# Patient Record
Sex: Female | Born: 1965 | Race: White | Hispanic: No | Marital: Married | Smoking: Never smoker
Health system: Southern US, Community
[De-identification: ages and names within clinical notes are randomized; demographics above are authoritative.]

## PROBLEM LIST (undated history)

## (undated) DIAGNOSIS — T148XXA Other injury of unspecified body region, initial encounter: Secondary | ICD-10-CM

## (undated) DIAGNOSIS — K648 Other hemorrhoids: Secondary | ICD-10-CM

## (undated) DIAGNOSIS — G43829 Menstrual migraine, not intractable, without status migrainosus: Secondary | ICD-10-CM

## (undated) DIAGNOSIS — K602 Anal fissure, unspecified: Secondary | ICD-10-CM

## (undated) DIAGNOSIS — IMO0001 Reserved for inherently not codable concepts without codable children: Secondary | ICD-10-CM

## (undated) DIAGNOSIS — K210 Gastro-esophageal reflux disease with esophagitis: Secondary | ICD-10-CM

## (undated) DIAGNOSIS — B356 Tinea cruris: Secondary | ICD-10-CM

## (undated) DIAGNOSIS — N189 Chronic kidney disease, unspecified: Secondary | ICD-10-CM

## (undated) DIAGNOSIS — J309 Allergic rhinitis, unspecified: Secondary | ICD-10-CM

## (undated) DIAGNOSIS — G40909 Epilepsy, unspecified, not intractable, without status epilepticus: Secondary | ICD-10-CM

## (undated) DIAGNOSIS — G4730 Sleep apnea, unspecified: Secondary | ICD-10-CM

## (undated) DIAGNOSIS — M1990 Unspecified osteoarthritis, unspecified site: Secondary | ICD-10-CM

## (undated) DIAGNOSIS — K449 Diaphragmatic hernia without obstruction or gangrene: Secondary | ICD-10-CM

## (undated) DIAGNOSIS — K219 Gastro-esophageal reflux disease without esophagitis: Secondary | ICD-10-CM

## (undated) DIAGNOSIS — R569 Unspecified convulsions: Secondary | ICD-10-CM

## (undated) DIAGNOSIS — R7301 Impaired fasting glucose: Secondary | ICD-10-CM

## (undated) DIAGNOSIS — E669 Obesity, unspecified: Secondary | ICD-10-CM

## (undated) HISTORY — DX: Unspecified convulsions: R56.9

## (undated) HISTORY — DX: Other injury of unspecified body region, initial encounter: T14.8XXA

## (undated) HISTORY — DX: Diaphragmatic hernia without obstruction or gangrene: K44.9

## (undated) HISTORY — DX: Impaired fasting glucose: R73.01

## (undated) HISTORY — DX: Obesity, unspecified: E66.9

## (undated) HISTORY — DX: Unspecified osteoarthritis, unspecified site: M19.90

## (undated) HISTORY — DX: Gastro-esophageal reflux disease without esophagitis: K21.9

## (undated) HISTORY — DX: Tinea cruris: B35.6

## (undated) HISTORY — DX: Anal fissure, unspecified: K60.2

## (undated) HISTORY — DX: Menstrual migraine, not intractable, without status migrainosus: G43.829

## (undated) HISTORY — DX: Chronic kidney disease, unspecified: N18.9

## (undated) HISTORY — DX: Reserved for inherently not codable concepts without codable children: IMO0001

## (undated) HISTORY — DX: Epilepsy, unspecified, not intractable, without status epilepticus: G40.909

## (undated) HISTORY — DX: Sleep apnea, unspecified: G47.30

## (undated) HISTORY — DX: Other hemorrhoids: K64.8

## (undated) HISTORY — DX: Gastro-esophageal reflux disease with esophagitis: K21.0

## (undated) HISTORY — DX: Allergic rhinitis, unspecified: J30.9

## (undated) HISTORY — PX: OTHER SURGICAL HISTORY: SHX169

---

## 1997-05-30 ENCOUNTER — Inpatient Hospital Stay (HOSPITAL_COMMUNITY): Admission: AD | Admit: 1997-05-30 | Discharge: 1997-06-01 | Payer: Self-pay | Admitting: *Deleted

## 1998-08-01 ENCOUNTER — Observation Stay (HOSPITAL_COMMUNITY): Admission: AD | Admit: 1998-08-01 | Discharge: 1998-08-01 | Payer: Self-pay | Admitting: Obstetrics and Gynecology

## 1998-08-24 ENCOUNTER — Inpatient Hospital Stay (HOSPITAL_COMMUNITY): Admission: AD | Admit: 1998-08-24 | Discharge: 1998-08-24 | Payer: Self-pay | Admitting: *Deleted

## 1998-10-06 ENCOUNTER — Inpatient Hospital Stay (HOSPITAL_COMMUNITY): Admission: AD | Admit: 1998-10-06 | Discharge: 1998-10-06 | Payer: Self-pay | Admitting: Obstetrics and Gynecology

## 1999-01-03 ENCOUNTER — Inpatient Hospital Stay (HOSPITAL_COMMUNITY): Admission: AD | Admit: 1999-01-03 | Discharge: 1999-01-06 | Payer: Self-pay | Admitting: *Deleted

## 1999-01-04 ENCOUNTER — Encounter: Payer: Self-pay | Admitting: Obstetrics and Gynecology

## 1999-01-08 ENCOUNTER — Ambulatory Visit (HOSPITAL_COMMUNITY): Admission: RE | Admit: 1999-01-08 | Discharge: 1999-01-08 | Payer: Self-pay | Admitting: Psychiatry

## 1999-08-05 ENCOUNTER — Emergency Department (HOSPITAL_COMMUNITY): Admission: EM | Admit: 1999-08-05 | Discharge: 1999-08-05 | Payer: Self-pay | Admitting: *Deleted

## 2003-03-17 HISTORY — PX: CHOLECYSTECTOMY: SHX55

## 2006-10-01 ENCOUNTER — Ambulatory Visit (HOSPITAL_COMMUNITY): Admission: RE | Admit: 2006-10-01 | Discharge: 2006-10-03 | Payer: Self-pay | Admitting: *Deleted

## 2007-09-14 DIAGNOSIS — B356 Tinea cruris: Secondary | ICD-10-CM

## 2007-09-14 HISTORY — DX: Tinea cruris: B35.6

## 2007-12-06 ENCOUNTER — Encounter: Admission: RE | Admit: 2007-12-06 | Discharge: 2008-01-13 | Payer: Self-pay | Admitting: Neurology

## 2008-06-06 ENCOUNTER — Ambulatory Visit (HOSPITAL_COMMUNITY): Admission: RE | Admit: 2008-06-06 | Discharge: 2008-06-06 | Payer: Self-pay | Admitting: Rheumatology

## 2009-06-07 DIAGNOSIS — G4730 Sleep apnea, unspecified: Secondary | ICD-10-CM

## 2009-06-07 HISTORY — DX: Sleep apnea, unspecified: G47.30

## 2010-07-29 NOTE — Op Note (Signed)
NAMEMAXCINE, STRONG                ACCOUNT NO.:  [id number]   MEDICAL RECORD NO.:  [id number]          PATIENT TYPE:  INP   LOCATION:  1532                         FACILITY:  [facility]   PHYSICIAN:  Alfonse Ras, MD   DATE OF BIRTH:  [birth year]   DATE OF PROCEDURE:  10/01/2006  DATE OF DISCHARGE:  10/03/2006                               OPERATIVE REPORT   PREOPERATIVE DIAGNOSIS:  Morbid obesity and ventral hernia.   POSTOPERATIVE DIAGNOSIS:  Morbid obesity and ventral hernia.   PROCEDURE:  Laparoscopic ventral hernia repair with mesh, Parietex dual  sided.   SURGEON:  Dr. Baruch Merl.   ASSISTANT:  Dr. Luretha Murphy.   ANESTHESIA:  General.   DESCRIPTION:  The patient was taken to the operating room, placed in  supine position.  After adequate general anesthesia was induced using  endotracheal tube, the abdomen was prepped and draped in normal sterile  fashion.  Foley catheter was placed.  Using an 11 mm OptiVu trocar in  the left upper quadrant, peritoneal access was obtained under direct  vision.  Pneumoperitoneum was obtained.  Additional 11 mm trocar was  placed in left lower quadrant and the left mid abdomen.  Additional 5 mm  trocar was placed in the right midabdomen.  Omental contents of the  hernia were removed from the hernia sac.  The hernia defect itself was  not very large probably only about 4 to 5 cm.  Dual sided Parietex mesh  had four Novofil sutures placed in it and it was placed in the abdominal  cavity.  It was brought up to cover the hernia defect and sutures were  brought out using the Storz suture passer.  The sutures were tied down.  The periphery of the mesh was tacked with tacker in all directions.  This appeared to cover the hernia defect quite well.  Adequate  hemostasis was assured.  Trocars were removed.  Skin was closed with  interrupted 3-0 Monocryl.  Steri-Strips and dressings were applied.  The  patient tolerated the procedure well and went  to PACU in good condition.      Alfonse Ras, MD  Electronically Signed     KRE/MEDQ  D:  10/04/2006  T:  10/04/2006  Job:  [id number]

## 2010-08-01 NOTE — Discharge Summary (Signed)
NAMEANIJA, BRICKNER                ACCOUNT NO.:  [id number]   MEDICAL RECORD NO.:  [id number]          PATIENT TYPE:  INP   LOCATION:  1532                         FACILITY:  [facility]   PHYSICIAN:  Alfonse Ras, MD   DATE OF BIRTH:  1965-10-05   DATE OF ADMISSION:  10/01/2006  DATE OF DISCHARGE:  10/03/2006                               DISCHARGE SUMMARY   ADMISSION DIAGNOSIS:  Incarcerated ventral hernia.   DISCHARGE DIAGNOSIS:  Incarcerated ventral hernia.   PROCEDURE:  Laparoscopic ventral hernia repair.   CONDITION ON DISCHARGE:  Good and improved.   DISPOSITION:  Discharged to home.   FOLLOWUP:  With me in 1 week.   MEDICATIONS:  Vicodin for pain.   HOSPITAL COURSE:  The patient was admitted for laparoscopic ventral  hernia repair which she underwent without much difficulty.  On  postoperative day #1, vital signs were stable.  Urine output was good.  Abdomen was nontender.  She started tolerating a regular diet, and by  postoperative day #2 was ready for discharge home.      Alfonse Ras, MD  Electronically Signed     KRE/MEDQ  D:  10/14/2006  T:  10/15/2006  Job:  [id number]

## 2010-08-15 HISTORY — PX: KIDNEY STONE SURGERY: SHX686

## 2010-12-29 LAB — CBC
Hemoglobin: 14
MCHC: 34.4
MCHC: 34.4
MCV: 89.7
MCV: 90.8
Platelets: 316
RBC: 4.46
RDW: 13.4

## 2010-12-29 LAB — URINALYSIS, ROUTINE W REFLEX MICROSCOPIC
Bilirubin Urine: NEGATIVE
Glucose, UA: NEGATIVE
Hgb urine dipstick: NEGATIVE
Ketones, ur: NEGATIVE
Nitrite: NEGATIVE
Protein, ur: NEGATIVE
Specific Gravity, Urine: 1.016
Urobilinogen, UA: 0.2
pH: 7

## 2010-12-29 LAB — BASIC METABOLIC PANEL
BUN: 5 — ABNORMAL LOW
CO2: 30
Calcium: 9
Chloride: 102
Creatinine, Ser: 0.56
GFR calc Af Amer: >60
GFR calc non Af Amer: >60
Glucose, Bld: 103 — ABNORMAL HIGH
Potassium: 4.2
Sodium: 141

## 2010-12-29 LAB — PHENYTOIN LEVEL, TOTAL: Phenytoin Lvl: 6.7 — ABNORMAL LOW

## 2010-12-29 LAB — PREGNANCY, URINE: Preg Test, Ur: NEGATIVE

## 2011-01-15 ENCOUNTER — Encounter: Payer: Self-pay | Admitting: *Deleted

## 2011-01-22 ENCOUNTER — Ambulatory Visit (INDEPENDENT_AMBULATORY_CARE_PROVIDER_SITE_OTHER): Payer: 59 | Admitting: Family Medicine

## 2011-01-22 ENCOUNTER — Encounter: Payer: Self-pay | Admitting: Family Medicine

## 2011-01-22 ENCOUNTER — Encounter: Payer: Self-pay | Admitting: Internal Medicine

## 2011-01-22 VITALS — BP 120/78 | HR 80 | Ht 63.0 in | Wt 263.0 lb

## 2011-01-22 DIAGNOSIS — K625 Hemorrhage of anus and rectum: Secondary | ICD-10-CM

## 2011-01-22 DIAGNOSIS — R7301 Impaired fasting glucose: Secondary | ICD-10-CM

## 2011-01-22 DIAGNOSIS — L4050 Arthropathic psoriasis, unspecified: Secondary | ICD-10-CM

## 2011-01-22 DIAGNOSIS — R195 Other fecal abnormalities: Secondary | ICD-10-CM

## 2011-01-22 DIAGNOSIS — K439 Ventral hernia without obstruction or gangrene: Secondary | ICD-10-CM

## 2011-01-22 LAB — POCT CBG (FASTING - GLUCOSE)-MANUAL ENTRY: Glucose Fasting, POC: 87 mg/dL (ref 70–99)

## 2011-01-22 LAB — POCT GLYCOSYLATED HEMOGLOBIN (HGB A1C): Hemoglobin A1C: 5.7

## 2011-01-22 NOTE — Progress Notes (Signed)
Patient presents to establish care.  Former PCP in [place], but she'd like PCP in [place] since she works here.  She is followed by Dr. Anne Hahn for seizures (last seizure 2 years ago), and Dr. Corliss Skains for psoriatic arthritis. She is likely going to be starting a biologic agent in the near future.  Waiting for yeast infection to clear before starting. She has h/o frequent bronchitis and asthma flares with allergies and colds, but denies any symptoms currently.  She presents with concern about possible diabetes.  Has had elevated blood sugars in past, told she is a borderline diabetic.  She has been having some trouble with a fungal rash below her breasts, requiring oral antifungal medication.  She was told to be re-checked by diabetes. Hasn't seen her PCP in about 3 years.  She has a recurrent hernia in R lower abdomen.  Had incisional hernia after lap cholecystectomy that was repaired, but had recurrent hernia again.  Dr. Colin Benton at [facility] did a second hernia repair.  Hernia recurred again, but she didn't recommend 3rd repair, but did encouraged weight loss, and to consider lap band surgery.  She hasn't followed up with her again.  Has some mild discomfort, but no significant pain or evidence of strangulation.  Using girdles and spanx, and that seems to help with discomfort.  H/o anal fissures.  Has noticed change in color of her stools, much lighter, sometimes with streaks of blood.  Stools have also gotten narrower, and sees blood even in the narrow stools.  Has been having these narrowed stools for about a year, never seeing thicker caliber stools.  Stool quality changed to looser stools after having cholecystectomy.  Has some intermittent RLQ discomfort which she associates with the hernia.  She is on diflucan (300mg  daily x 2 weeks) for fungal infection below breasts.  It finally seems to be improving with this medication  Past Medical History  Diagnosis Date  . Seizure disorder     f/b Dr.  Anne Hahn  . Asthma     related to allergies and colds  . Chronic bronchitis     gets bronchitis yearly with colds  . Sleep apnea     inconclusive test per pt (some central and obstructive component)  . Impaired fasting glucose   . Arthritis     inflammatory and psoriatic--Dr. Corliss Skains  . Hernia (acquired) (recurrent)     R lower abdomen; has seen [facility] (Dr. Colin Benton)  . Anal fissure     Past Surgical History  Procedure Date  . Cesarean section     x2  . Cholecystectomy 2005  . Repair of incisional hernia 2005, 2008    related to laparoscopic cholecystectomy  . Kidney stone surgery 08/2010    retrieved with basket (at [facility])    History   Social History  . Marital Status: Married    Spouse Name: N/A    Number of Children: 2  . Years of Education: N/A   Occupational History  . Pattern maker [employer]   Social History Main Topics  . Smoking status: Never Smoker   . Smokeless tobacco: Never Used  . Alcohol Use: No  . Drug Use: No  . Sexually Active: Yes -- Female partner(s)    Birth Control/ Protection: Surgical     husband had vasectomy   Other Topics Concern  . Not on file   Social History Narrative   Lives with husband and 2 children (son and daughter, 93, 110)  Family History  Problem Relation Age of Onset  . Arthritis Mother     rheumatoid  . Hyperlipidemia Mother   . Hypertension Mother   . Hyperthyroidism Mother     s/p thyroidectomy, now with hypothyroidism  . Cancer Father 31    AML, lung cancer, kidney cancer (all presented at same time)  . Diabetes Father   . Hypertension Father   . Hyperlipidemia Father   . Psoriasis Father   . Anxiety disorder Father     OCD, nervous breakdown  . Psoriasis Sister   . Irritable bowel syndrome Sister   . Arthritis Sister     psoriatic  . Diabetes Sister   . GER disease Daughter   . Asthma Son   . Diabetes Paternal Uncle   . Crohn's disease Paternal Uncle   . Cancer Paternal Uncle     ? type  .  Asthma Maternal Grandmother   . Hyperlipidemia Maternal Grandmother   . Heart disease Maternal Grandmother   . Stroke Maternal Grandmother   . Diabetes Maternal Grandfather   . Heart disease Maternal Grandfather   . Cancer Paternal Grandmother 78    female (?ovarian)  . Diabetes Paternal Grandmother   . Hypertension Paternal Grandmother   . Diabetes Paternal Grandfather   . Heart disease Paternal Grandfather   . Hypertension Paternal Grandfather     Current outpatient prescriptions:albuterol (PROVENTIL HFA;VENTOLIN HFA) 108 (90 BASE) MCG/ACT inhaler, Inhale 2 puffs into the lungs as needed.  , Disp: , Rfl: ;  fluconazole (DIFLUCAN) 150 MG tablet, Take 300 mg by mouth daily.  , Disp: , Rfl: ;  folic acid (FOLVITE) 400 MCG tablet, Take 800 mcg by mouth daily.  , Disp: , Rfl: ;  ibuprofen (ADVIL,MOTRIN) 200 MG tablet, Take 800 mg by mouth 2 (two) times daily.  , Disp: , Rfl:  methotrexate (RHEUMATREX) 5 MG tablet, Take 35 mg by mouth once a week. Caution: Chemotherapy. Protect from light. , Disp: , Rfl: ;  topiramate (TOPAMAX) 25 MG tablet, Take 25 mg by mouth 2 (two) times daily.  , Disp: , Rfl:   Allergies  Allergen Reactions  . Amoxil Hives  . Codeine Itching and Other (See Comments)    Heart races.  . Sulfa Antibiotics Nausea Only  . Toradol Other (See Comments)    Stopped breathing.  . Transderm-Scop (Scopolamine) Other (See Comments)    Stopped breathing.   ROS:  Denies fevers, URI symptoms, chest pain, shortness of breath, cough, urinary symptoms.  See HPI for GI concerns, skin concerns.  Denies nausea, vomiting.  PHYSICAL EXAM: BP 120/78  Pulse 80  Ht 5\' 3"  (1.6 m)  Wt 263 lb (119.296 kg)  BMI 46.59 kg/m2  LMP 01/12/2011 Well developed, pleasant, talkative obese female in no distress HEENT: PERRL, EOMI, conjunctiva clear, OP clear Neck: no lymphadenopathy, thyromegaly or mass Heart: regular rate and rhythm without murmurs Lungs: clear bilaterally Abdomen: soft,  nontender, normal bowel sounds.  No organomegaly or mass.  No evidence of hernia in supine position, somewhat of asymmetric bulge to RLQ when standing. Extremities: no clubbing, cyanosis or edema, 2+ pulse Psych: normal mood, affect, hygiene and grooming Skin: mild erythema at skin bold below breasts  ASSESSMENT/PLAN: 1. Impaired fasting glucose  Lipid panel, POCT HgB A1C, POCT CBG (Fasting - Glucose)  2. Rectal bleeding  Ambulatory referral to Gastroenterology  3. Hernia of abdominal wall    4. Psoriatic arthritis    5. Change in stool  TSH, Ambulatory referral  to Gastroenterology   Intertrigo, fungal--clinically improving on high dose antifungals.  Continue measures to keep skin dry (cotton bra, powder, etc) Normal fasting glucose and A1c--reassured that she doesn't have diabetes.  Encouraged daily exercise and weight loss. Possible recurrent hernia--no evidence of strangulation.  To f/u with Dr. Colin Benton if increasing swelling, pain or nonreducible Refer to GI for changes in bowel habits and rectal bleeding.  Recent normal CBC by Dr. Corliss Skains

## 2011-01-23 ENCOUNTER — Encounter: Payer: Self-pay | Admitting: Family Medicine

## 2011-02-13 ENCOUNTER — Ambulatory Visit: Payer: 59 | Admitting: Gastroenterology

## 2011-03-11 ENCOUNTER — Ambulatory Visit (INDEPENDENT_AMBULATORY_CARE_PROVIDER_SITE_OTHER): Payer: 59 | Admitting: Internal Medicine

## 2011-03-11 ENCOUNTER — Encounter: Payer: Self-pay | Admitting: Internal Medicine

## 2011-03-11 DIAGNOSIS — R197 Diarrhea, unspecified: Secondary | ICD-10-CM

## 2011-03-11 DIAGNOSIS — R1013 Epigastric pain: Secondary | ICD-10-CM

## 2011-03-11 DIAGNOSIS — R195 Other fecal abnormalities: Secondary | ICD-10-CM

## 2011-03-11 DIAGNOSIS — K625 Hemorrhage of anus and rectum: Secondary | ICD-10-CM

## 2011-03-11 MED ORDER — OMEPRAZOLE MAGNESIUM 20 MG PO TBEC: 20.0000 mg | DELAYED_RELEASE_TABLET | Freq: Every day | ORAL | Status: DC

## 2011-03-11 MED ORDER — PEG-KCL-NACL-NASULF-NA ASC-C 100 G PO SOLR: 1.0000 | Freq: Once | ORAL | Status: DC

## 2011-03-11 NOTE — Progress Notes (Signed)
CEYLIN DREIBELBIS 1965-04-20 MRN [id number]    History of Present Illness:  This is a 45 year old white female with a change in bowel habits. She is having soft stools alternating with diarrhea and constipation. They are ribbonlike and pale. She has occasional urgency resulting in accidents. There is a family history of colon cancer and diverticulosis in a paternal uncle. She had severe diarrhea after her cholecystectomy in 2005. She is now seeing blood as she wipes and also in the commode. There is no family history of inflammatory bowel disease. Another problem has been epigastric pain. She has been taking ibuprofen 200 mg 4-5 tablets twice a day for psoriatic arthritis. She was started on biologicals last week and is already experiencing marked improvement in her joint pains. She is also on methotrexate 16.5 mg weekly. She is on a prednisone taper starting at 5 mg on going down by 1 mg every week. Following cholecystectomy in 2005, patient developed an umbilical hernia which recurred and had to be repaired twice. She currently has mesh in her abdominal wall.   Past Medical History  Diagnosis Date  . Seizure disorder     f/b Dr. Anne Hahn  . Asthma     related to allergies and colds  . Chronic bronchitis     gets bronchitis yearly with colds  . Sleep apnea     inconclusive test per pt (some central and obstructive component)  . Impaired fasting glucose   . Arthritis     inflammatory and psoriatic--Dr. Corliss Skains  . Hernia (acquired) (recurrent)     R lower abdomen; has seen [facility] (Dr. Colin Benton)  . Anal fissure    Past Surgical History  Procedure Date  . Cesarean section     x2  . Cholecystectomy 2005  . Repair of incisional hernia 2005, 2008    related to laparoscopic cholecystectomy  . Kidney stone surgery 08/2010    retrieved with basket (at [facility])    reports that she has never smoked. She has never used smokeless tobacco. She reports that she does not drink alcohol or use illicit  drugs. family history includes Anxiety disorder in her father; Arthritis in her mother and sister; Asthma in her maternal grandmother and son; Cancer in her paternal uncle; Cancer (age of onset:69) in her father; Cancer (age of onset:93) in her paternal grandmother; Crohn's disease in her paternal uncle; Diabetes in her father, maternal grandfather, paternal grandfather, paternal grandmother, paternal uncle, and sister; GER disease in her daughter; Heart disease in her maternal grandfather, maternal grandmother, and paternal grandfather; Hyperlipidemia in her father, maternal grandmother, and mother; Hypertension in her father, mother, paternal grandfather, and paternal grandmother; Hyperthyroidism in her mother; Irritable bowel syndrome in her sister; Psoriasis in her father and sister; and Stroke in her maternal grandmother. Allergies  Allergen Reactions  . Amoxil Hives  . Codeine Itching and Other (See Comments)    Heart races.  . Sulfa Antibiotics Nausea Only  . Toradol Other (See Comments)    Stopped breathing.  . Transderm-Scop (Scopolamine) Other (See Comments)    Stopped breathing.        Review of Systems:Occasionally dysphagia, heartburn. He still vital hernia. Diarrhea and rectal bleeding  The remainder of the 10 point ROS is negative except as outlined in H&P   Physical Exam: General appearance  Well developed, in no distress., Obese  Eyes- non icteric. HEENT nontraumatic, normocephalic. Mouth no lesions, tongue papillated, no cheilosis. Neck supple without adenopathy, thyroid not enlarged, no carotid bruits,  no JVD. Lungs Clear to auscultation bilaterally. Cor normal S1, normal S2, regular rhythm, no murmur,  quiet precordium. Abdomen: Obese soft with tenderness in epigastrium and in the midline. Psoriasis around the umbilicus. Normal active bowel sounds. Liver edge at costal margin.  Rectal:Psoriasis in the  perineum and between the buttocks. Normal rectal sphincter tone.  Hemoccult-positive stool. Tender anal canal on a digital exam. Small external hemorrhoids  Extremities no pedal edema. Skin  psoriatic lesions in the umbilical area and between the buttocks. Neurological alert and oriented x 3. Psychological normal mood and affect.  Assessment and Plan:  Problem #1 change in bowel habits partly due to postcholecystectomy diarrhea. She is now Hemoccult-positive. We need to rule out microscopic colitis, anorectal source of bleeding, or irritable bowel syndrome. I doubt this is Crohn's disease. We will proceed with a colonoscopy and random biopsies. Eventually, she may need Questran or antispasmodics to control her symptoms.  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 We need to rule out a gastric ulcer. We will plan to do an upper endoscopy and small bowel biopsy to rule out sprue. She will start samples of Prilosec 20 mg by mouth daily. I have also asked the patient to cut back on her ibuprofen.   03/11/2011 Lina Sar

## 2011-03-11 NOTE — Patient Instructions (Addendum)
You have been scheduled for an endoscopy and colonoscopy. Please follow the written instructions given to you at your visit today. Please pick up your prep at the pharmacy within the next 2-3 days. We have given you samples of Prilosec to take once daily. CC: Dr Joselyn Arrow, Dr Corliss Skains

## 2011-03-17 DIAGNOSIS — K648 Other hemorrhoids: Secondary | ICD-10-CM

## 2011-03-17 DIAGNOSIS — K2100 Gastro-esophageal reflux disease with esophagitis, without bleeding: Secondary | ICD-10-CM

## 2011-03-17 DIAGNOSIS — K449 Diaphragmatic hernia without obstruction or gangrene: Secondary | ICD-10-CM

## 2011-03-17 HISTORY — DX: Diaphragmatic hernia without obstruction or gangrene: K44.9

## 2011-03-17 HISTORY — DX: Other hemorrhoids: K64.8

## 2011-03-17 HISTORY — DX: Gastro-esophageal reflux disease with esophagitis, without bleeding: K21.00

## 2011-03-19 ENCOUNTER — Encounter: Payer: Self-pay | Admitting: Internal Medicine

## 2011-03-19 ENCOUNTER — Ambulatory Visit (AMBULATORY_SURGERY_CENTER): Payer: 59 | Admitting: Internal Medicine

## 2011-03-19 DIAGNOSIS — K210 Gastro-esophageal reflux disease with esophagitis: Secondary | ICD-10-CM

## 2011-03-19 DIAGNOSIS — K2940 Chronic atrophic gastritis without bleeding: Secondary | ICD-10-CM

## 2011-03-19 DIAGNOSIS — R197 Diarrhea, unspecified: Secondary | ICD-10-CM

## 2011-03-19 DIAGNOSIS — R195 Other fecal abnormalities: Secondary | ICD-10-CM

## 2011-03-19 DIAGNOSIS — R1013 Epigastric pain: Secondary | ICD-10-CM

## 2011-03-19 DIAGNOSIS — K625 Hemorrhage of anus and rectum: Secondary | ICD-10-CM

## 2011-03-19 HISTORY — PX: ESOPHAGOGASTRODUODENOSCOPY: SHX1529

## 2011-03-19 HISTORY — PX: COLONOSCOPY: SHX174

## 2011-03-19 MED ORDER — ESOMEPRAZOLE MAGNESIUM 20 MG PO CPDR: 20.0000 mg | DELAYED_RELEASE_CAPSULE | Freq: Two times a day (BID) | ORAL | Status: DC

## 2011-03-19 MED ORDER — SODIUM CHLORIDE 0.9 % IV SOLN: 500.0000 mL | INTRAVENOUS | Status: DC

## 2011-03-19 MED ORDER — HYDROCORTISONE ACETATE 25 MG RE SUPP: 25.0000 mg | Freq: Every evening | RECTAL | Status: AC | PRN

## 2011-03-19 MED ORDER — DICYCLOMINE HCL 20 MG PO TABS: 20.0000 mg | ORAL_TABLET | Freq: Three times a day (TID) | ORAL | Status: DC | PRN

## 2011-03-19 NOTE — Patient Instructions (Signed)
Discharge instructions given with verbal understanding. Handouts on hemorrhoids,high fiber diet, esophagitis and a hiatal hernia given. Resume previous medications.

## 2011-03-19 NOTE — Op Note (Signed)
[facility] [address] [place], [place]  [address]  COLONOSCOPY PROCEDURE REPORT  PATIENT:  Tiffany Wilkins, Tiffany Wilkins  MR#:  [id number] BIRTHDATE:  06-08-1965, 45 yrs. old  GENDER:  female ENDOSCOPIST:  Hedwig Morton. Juanda Chance, MD REF. BY:  Joselyn Arrow, M.D. PROCEDURE DATE:  03/19/2011 PROCEDURE:  Colonoscopy 19147 ASA CLASS:  Class II INDICATIONS:  heme positive stool, hematochezia, unexplained diarrhea MEDICATIONS:   These medications were titrated to patient response per physician's verbal order, Versed 5 mg, Fentanyl 25 mcg, Benadryl 25 mg  DESCRIPTION OF PROCEDURE:   After the risks and benefits and of the procedure were explained, informed consent was obtained. Digital rectal exam was performed and revealed no rectal masses. The LB 180AL [id number] endoscope was introduced through the anus and advanced to the cecum, which was identified by the ileocecal valve.  The quality of the prep was good, using MoviPrep.  The instrument was then slowly withdrawn as the colon was fully examined. <<PROCEDUREIMAGES>>  FINDINGS:  Internal Hemorrhoids were found (see image4, image5, image6, and image7).  This was otherwise a normal examination of the colon (see image3, image2, and image1).   Retroflexed views in the rectum revealed no abnormalities.    The scope was then withdrawn from the patient and the procedure completed.  COMPLICATIONS:  None ENDOSCOPIC IMPRESSION: 1) Internal hemorrhoids 2) Otherwise normal examination hemes likely to be causing heme positive stool RECOMMENDATIONS: 1) High fiber diet. Bentyl 20 mg po bid Anusol HC supp 1 hs  REPEAT EXAM:  In 10 year(s) for.  ______________________________ Hedwig Morton. Juanda Chance, MD  CC:  n. eSIGNED:   Hedwig Morton. Shaquana Buel at 03/19/2011 11:02 AM  Raeanne Gathers, [phone]

## 2011-03-19 NOTE — Progress Notes (Signed)
Patient did not experience any of the following events: a burn prior to discharge; a fall within the facility; wrong site/side/patient/procedure/implant event; or a hospital transfer or hospital admission upon discharge from the facility. [phone]) Patient did not have preoperative order for IV antibiotic SSI prophylaxis. [phone])  Patient state she had a seizure. No visible seizure  activity noted. Responsible party at bedside. Vital signs within normal range.

## 2011-03-19 NOTE — Progress Notes (Signed)
The pt has a history of seisures.  Her last seisure was June 2012 after she had kidney surgery.  The pt request her husband to come to the recovery room ASAP.  The room nurse was notified and Kandace Parkins notified the recovery room nurse. Maw  The pt was very anxious when brought into the admission area.  She was anxious about IV stick and the procedure.  We tried to comfort her and explain everything being done to ease her anxiety.  Maw  Pt also c/o cough.  She said she has had the cough for months.  Pt finished a Z_Pack yesterday. mw

## 2011-03-19 NOTE — Op Note (Signed)
[facility] [address] [place], [place]  [address]  ENDOSCOPY PROCEDURE REPORT  PATIENT:  Tiffany Wilkins, Tiffany Wilkins  MR#:  [id number] BIRTHDATE:  [birth year], 45 yrs. old  GENDER:  female  ENDOSCOPIST:  Hedwig Morton. Juanda Chance, MD Referred by:  Joselyn Arrow, M.D.  PROCEDURE DATE:  03/19/2011 PROCEDURE:  EGD with biopsy, 43239 ASA CLASS:  Class II INDICATIONS:  abdominal pain, hemoccult positive stool taking Ibuprofen up to 1500 mg/day till recently  MEDICATIONS:   These medications were titrated to patient response per physician's verbal order, Versed 5 mg, Fentanyl 75 mcg TOPICAL ANESTHETIC:  Cetacaine Spray  DESCRIPTION OF PROCEDURE:   After the risks benefits and alternatives of the procedure were thoroughly explained, informed consent was obtained.  The LB GIF-H180 [id number] endoscope was introduced through the mouth and advanced to the second portion of the duodenum, without limitations.  The instrument was slowly withdrawn as the mucosa was fully examined. <<PROCEDUREIMAGES>>  Esophagitis was found in the distal esophagus. Grade 2 esophagitis, linear erosions With standard forceps, a biopsy was obtained and sent to pathology (see image1 and image2).  A hiatal hernia was found (see image6, image5, image1, and image2). 2 cm hiatal hernia  Otherwise the examination was normal. With standard forceps, a biopsy was obtained and sent to pathology (see image3 and image4). gastric biopsy    Retroflexed views revealed no abnormalities.    The scope was then withdrawn from the patient and the procedure completed.  COMPLICATIONS:  None  ENDOSCOPIC IMPRESSION: 1) Esophagitis in the distal esophagus 2) Hiatal hernia 3) Otherwise normal examination RECOMMENDATIONS: increase Prilosec to bid  REPEAT EXAM:  In 0 year(s) for.  ______________________________ Hedwig Morton. Juanda Chance, MD  CC:  n. eSIGNED:   Hedwig Morton. Siraj Dermody at 03/19/2011 11:07 AM  Raeanne Gathers, [phone]

## 2011-03-20 ENCOUNTER — Telehealth: Payer: Self-pay | Admitting: *Deleted

## 2011-03-20 NOTE — Telephone Encounter (Signed)
No answer, identifier on cell phone, message left to call if questions or concerns.

## 2011-03-24 ENCOUNTER — Encounter: Payer: Self-pay | Admitting: Internal Medicine

## 2011-03-24 ENCOUNTER — Encounter: Payer: Self-pay | Admitting: Family Medicine

## 2011-03-24 DIAGNOSIS — K648 Other hemorrhoids: Secondary | ICD-10-CM | POA: Insufficient documentation

## 2011-03-24 DIAGNOSIS — K210 Gastro-esophageal reflux disease with esophagitis: Secondary | ICD-10-CM

## 2011-03-26 ENCOUNTER — Encounter: Payer: Self-pay | Admitting: Family Medicine

## 2011-03-26 DIAGNOSIS — K449 Diaphragmatic hernia without obstruction or gangrene: Secondary | ICD-10-CM | POA: Insufficient documentation

## 2011-04-14 ENCOUNTER — Ambulatory Visit: Payer: 59 | Admitting: Medical

## 2011-08-26 ENCOUNTER — Encounter: Payer: Self-pay | Admitting: Family Medicine

## 2011-09-10 ENCOUNTER — Telehealth: Payer: Self-pay | Admitting: *Deleted

## 2011-09-10 ENCOUNTER — Encounter: Payer: Self-pay | Admitting: *Deleted

## 2011-09-10 NOTE — Telephone Encounter (Signed)
Called patient and left message for her to call back and schedule CPE with pap per Dr.Knapp's recommendations after reviewing her past records that were received.

## 2012-01-04 ENCOUNTER — Encounter: Payer: Self-pay | Admitting: Family Medicine

## 2012-01-04 DIAGNOSIS — Z9989 Dependence on other enabling machines and devices: Secondary | ICD-10-CM | POA: Insufficient documentation

## 2012-01-04 DIAGNOSIS — G4733 Obstructive sleep apnea (adult) (pediatric): Secondary | ICD-10-CM

## 2012-01-21 ENCOUNTER — Encounter: Payer: Self-pay | Admitting: Family Medicine

## 2012-06-01 ENCOUNTER — Encounter: Payer: Self-pay | Admitting: Nurse Practitioner

## 2012-06-01 ENCOUNTER — Ambulatory Visit (INDEPENDENT_AMBULATORY_CARE_PROVIDER_SITE_OTHER): Payer: 59 | Admitting: Nurse Practitioner

## 2012-06-01 VITALS — BP 125/79 | HR 67 | Ht 63.5 in | Wt 295.0 lb

## 2012-06-01 DIAGNOSIS — G43909 Migraine, unspecified, not intractable, without status migrainosus: Secondary | ICD-10-CM

## 2012-06-01 DIAGNOSIS — G40209 Localization-related (focal) (partial) symptomatic epilepsy and epileptic syndromes with complex partial seizures, not intractable, without status epilepticus: Secondary | ICD-10-CM

## 2012-06-01 NOTE — Progress Notes (Signed)
HPI: Patient returns for follow up visit. She has a history of migraine headache , seizure disorder obstructive sleep apnea with CPAP and obesity. Date of last seizure was January 2014 which was not reported to the office. Event was brief only 20 seconds. She claims that she had gotten off schedule with her Topamax. She denies further staring spells, confusion, sleep disturbances, lapses of time,  and bowel and bladder incontinence.  Headache frequency is rare.  She cannot remember the last time she had a migraine. States she is compliant with her CPAP  at 10 cm of pressure with nasal mask   ROS: See above visit information  Physical Exam General: well developed, well nourished, seated, in no evident distress Head: head normocephalic and atraumatic. Orohparynx benign Neck: supple with no carotid or supraclavicular bruits Cardiovascular: regular rate and rhythm, no murmurs  Neurologic Exam Mental Status: Awake and fully alert. Oriented to place and time. Recent and remote memory intact. Attention span, concentration and fund of knowledge appropriate. Mood and affect appropriate.  Cranial Nerves: Fundoscopic exam reveals sharp disc margins. Pupils equal, briskly reactive to light. Extraocular movements full without nystagmus. Visual fields full to confrontation. Hearing intact and symmetric to finer snap. Facial sensation intact. Face, tongue, palate move normally and symmetrically. Neck flexion and extension normal.  Motor: Normal bulk and tone. Normal strength in all tested extremity muscles. Sensory.: intact to tough and pinprick and vibratory.  Coordination: Rapid alternating movements normal in all extremities. Finger-to-nose and heel-to-shin performed accurately bilaterally. Gait and Station: Arises from chair without difficulty. Stance is normal. Gait demonstrates normal stride length and balance . Able to heel, toe and tandem walk without difficulty.  Reflexes: 1+ and symmetric. Toes  downgoing.     ASSESSMENT:: Seizure disorder and migraine doing well with Topamax 50mg  twice daily. OSA with CPAP 10cm pressure via nasal mask.     PLAN:  Continue topiramate 50 mg twice a day. Do not miss doses. Call for breakthrough seizures. Watch portion control for weight loss. Followup 6 months with me in one year with Dr. Vickey Huger for sleep   Nilda Riggs, GNP-BC APRN

## 2012-06-01 NOTE — Patient Instructions (Addendum)
Continue Topamax at 50mg  twice daily 12 hours apart. This is for migraine headaches as well as seizure disorder. Do not miss doses.  Continue CPAP with nasal mask at 10 cm of pressure.  Call for breakthrough seizures.  Watch portion control with meals. Difficult to exercise due to joint problems.  FU in 6 months with me and 1 year with Dr. Vickey Huger for sleep.

## 2012-06-01 NOTE — Progress Notes (Signed)
I have read the note and I agree with the assessment and plan.

## 2012-07-12 ENCOUNTER — Encounter: Payer: Self-pay | Admitting: Neurology

## 2012-08-30 ENCOUNTER — Encounter: Payer: Self-pay | Admitting: Medical

## 2012-08-30 ENCOUNTER — Ambulatory Visit (INDEPENDENT_AMBULATORY_CARE_PROVIDER_SITE_OTHER): Payer: 59 | Admitting: Medical

## 2012-08-30 VITALS — BP 120/80 | HR 72 | Temp 98.0°F | Resp 16 | Wt 294.0 lb

## 2012-08-30 DIAGNOSIS — L4050 Arthropathic psoriasis, unspecified: Secondary | ICD-10-CM

## 2012-08-30 DIAGNOSIS — B372 Candidiasis of skin and nail: Secondary | ICD-10-CM

## 2012-08-30 DIAGNOSIS — Z79899 Other long term (current) drug therapy: Secondary | ICD-10-CM

## 2012-08-30 MED ORDER — FLUCONAZOLE 150 MG PO TABS: 150.0000 mg | ORAL_TABLET | Freq: Once | ORAL | Status: DC

## 2012-08-30 NOTE — Patient Instructions (Signed)
Begin Diflucan 150mg  daily for 5-7 days.   Call back at 1 week to update [place] on symptoms.   If not resolved in a week, we will continue the medication.    Hold off on the Simponi and Methotrexate until this resolves.

## 2012-08-30 NOTE — Progress Notes (Signed)
Subjective: Patient presents for evaluation of a rash involving the chest wall beneath bilat breasts. She takes Methotrexate and immunosuppressant.  She called rheumatology today and was advised to not take her next scheduled does of MTX or the immunosuppressant until she gets this rash evaluated.  If fungal or shinkgesl, needs treatment per rheumatology.  Rash started 4 days ago. Lesions are red/pink, and itchy, spreading in texture. Rash has changed over time. Rash is pruritic. Associated symptoms: none. Patient denies: arthralgia, fever, headache and nausea. Patient has not had contacts with similar rash. Patient has not had new exposures (soaps, lotions, laundry detergents, foods, medications, plants, insects or animals). She does note hx/o similar treated with 3-4 wk of Diflucan, but rash was worse than what she has now.   She takes the MTX and immunosuppressant for psoriatic arthritis.  Past Medical History  Diagnosis Date  . Seizure disorder     f/b Dr. Anne Hahn  . Asthma     related to allergies and colds  . Chronic bronchitis     gets bronchitis yearly with colds  . Sleep apnea     inconclusive test per pt (some central and obstructive component)  . Impaired fasting glucose   . Arthritis     inflammatory and psoriatic--Dr. Corliss Skains  . Hernia (acquired) (recurrent)     R lower abdomen; has seen [facility] (Dr. Colin Benton)  . Anal fissure   . Chronic kidney disease kidney stones  . Seizures last seisure june 2012  . Tinea cruris 7/09  . Internal hemorrhoids 03/2011  . Hiatal hernia 03/2011  . Reflux esophagitis 03/2011  . GERD (gastroesophageal reflux disease)   . Allergic rhinitis, cause unspecified   . Menstrual migraine     Dr, Anne Hahn  . Sleep apnea 06/07/09    mild complex sleep apnea, worse in supine position   ROS as in subjective  Objective: Filed Vitals:   08/30/12 1605  BP: 120/80  Pulse: 72  Temp: 98 F (36.7 C)  Resp: 16    General appearance: alert, no distress, WD/WN,   Skin; bilat chest wall beneath breasts with patches of pink/red, adjacent and suggestive of candida intertrigo  Assessment: Encounter Diagnoses  Name Primary?  . Candidal intertrigo Yes  . High risk medication use   . Psoriatic arthritis      Plan: Begin 7-14 day course of Diflucan.  discussed risks/benefits of medication.   Hold the immunosuppressant and methotrexate x 7 or more days, but f/u in 1wk for consideration of restarting the medications.  Use starch or drying powders under the breasts.  Follow-up 1wk.

## 2012-12-04 ENCOUNTER — Other Ambulatory Visit: Payer: Self-pay | Admitting: Neurology

## 2012-12-09 ENCOUNTER — Encounter: Payer: Self-pay | Admitting: Neurology

## 2012-12-09 ENCOUNTER — Ambulatory Visit (INDEPENDENT_AMBULATORY_CARE_PROVIDER_SITE_OTHER): Payer: 59 | Admitting: Neurology

## 2012-12-09 VITALS — BP 131/82 | HR 74 | Wt 294.0 lb

## 2012-12-09 DIAGNOSIS — G43909 Migraine, unspecified, not intractable, without status migrainosus: Secondary | ICD-10-CM

## 2012-12-09 DIAGNOSIS — G40209 Localization-related (focal) (partial) symptomatic epilepsy and epileptic syndromes with complex partial seizures, not intractable, without status epilepticus: Secondary | ICD-10-CM

## 2012-12-09 MED ORDER — TOPIRAMATE 50 MG PO TABS: ORAL_TABLET | ORAL | Status: DC

## 2012-12-09 NOTE — Progress Notes (Signed)
Reason for visit: Seizures  Tiffany Wilkins is an 47 y.o. female  History of present illness:  Tiffany Wilkins is a 47 year old right-handed white female with a history of obesity, sleep apnea, seizures, and migraine headache. The patient is having menstrual migraine, and she will take Motrin when the headache comes on. This seems to be relatively effective. The patient has been placed on Simponi, and since being on this medication, she has had several seizures totaling 3 or 4. The patient believes that this medication is causing her seizures. The patient has an aura with the seizure with numbness and tingling of the tongue, and some nausea. The patient will then sometimes black out. The patient claims that she is still operating a motor vehicle. The last seizure was approximately 3 weeks ago. The patient is on Topamax taking 50 mg twice daily, but she has had some mild cognitive impairment on the medication, and she has a history of kidney stones. The patient returns to this office for an evaluation.  Past Medical History  Diagnosis Date  . Seizure disorder     f/b Dr. Anne Hahn  . Asthma     related to allergies and colds  . Chronic bronchitis     gets bronchitis yearly with colds  . Sleep apnea     inconclusive test per pt (some central and obstructive component)  . Impaired fasting glucose   . Arthritis     inflammatory and psoriatic--Dr. Corliss Skains  . Hernia (acquired) (recurrent)     R lower abdomen; has seen [facility] (Dr. Colin Benton)  . Anal fissure   . Chronic kidney disease kidney stones  . Seizures last seisure june 2012  . Tinea cruris 7/09  . Internal hemorrhoids 03/2011  . Hiatal hernia 03/2011  . Reflux esophagitis 03/2011  . GERD (gastroesophageal reflux disease)   . Allergic rhinitis, cause unspecified   . Menstrual migraine     Dr, Anne Hahn  . Sleep apnea 06/07/09    mild complex sleep apnea, worse in supine position  . Obesity     Past Surgical History  Procedure Laterality Date  .  Cesarean section      x2  . Cholecystectomy  2005  . Repair of incisional hernia  2005, 2008    related to laparoscopic cholecystectomy  . Kidney stone surgery  08/2010    retrieved with basket (at [facility])  . Colonoscopy  03/19/11    Dr. Juanda Chance; internal hemorrhoids  . Esophagogastroduodenoscopy  03/19/11    hiatal hernia, esophagitis    Family History  Problem Relation Age of Onset  . Arthritis Mother     rheumatoid  . Hyperlipidemia Mother   . Hypertension Mother   . Hyperthyroidism Mother     s/p thyroidectomy, now with hypothyroidism  . Cancer Father 57    AML, lung cancer, kidney cancer (all presented at same time)  . Diabetes Father   . Hypertension Father   . Hyperlipidemia Father   . Psoriasis Father   . Anxiety disorder Father     OCD, nervous breakdown  . Psoriasis Sister   . Irritable bowel syndrome Sister   . Arthritis Sister     psoriatic  . Diabetes Sister   . GER disease Daughter   . Asthma Son   . Diabetes Paternal Uncle   . Crohn's disease Paternal Uncle   . Cancer Paternal Uncle     ? type  . Colitis Paternal Uncle   . Asthma Maternal Grandmother   .  Hyperlipidemia Maternal Grandmother   . Heart disease Maternal Grandmother   . Stroke Maternal Grandmother   . Diabetes Maternal Grandfather   . Heart disease Maternal Grandfather   . Cancer Paternal Grandmother 53    female (?ovarian)  . Diabetes Paternal Grandmother   . Hypertension Paternal Grandmother   . Diabetes Paternal Grandfather   . Heart disease Paternal Grandfather   . Hypertension Paternal Grandfather   . Esophageal cancer Neg Hx   . Stomach cancer Neg Hx     Social history:  reports that she has never smoked. She has never used smokeless tobacco. She reports that she does not drink alcohol or use illicit drugs.    Allergies  Allergen Reactions  . Amoxicillin Hives  . Codeine Itching and Other (See Comments)    Heart races.  . Keppra [Levetiracetam]     Suicide ideation  .  Ketorolac Tromethamine Other (See Comments)    Stopped breathing.  . Sulfa Antibiotics Nausea Only  . Transderm-Scop [Scopolamine] Other (See Comments)    Stopped breathing.    Medications:  Current Outpatient Prescriptions on File Prior to Visit  Medication Sig Dispense Refill  . albuterol (PROVENTIL HFA;VENTOLIN HFA) 108 (90 BASE) MCG/ACT inhaler Inhale 2 puffs into the lungs as needed.        . fluconazole (DIFLUCAN) 150 MG tablet Take 1 tablet (150 mg total) by mouth once.  14 tablet  0  . folic acid (FOLVITE) 1 MG tablet Take 1 mg by mouth daily.        . Golimumab (SIMPONI [place]) Inject into the skin every 30 (thirty) days.        Marland Kitchen ibuprofen (ADVIL,MOTRIN) 200 MG tablet Take 800 mg by mouth 2 (two) times daily.        . methotrexate (RHEUMATREX) 2.5 MG tablet Take 17.5 mg by mouth once a week. Caution:Chemotherapy. Protect from light.       . dicyclomine (BENTYL) 20 MG tablet Take 1 tablet (20 mg total) by mouth 3 (three) times daily as needed.  60 tablet  0   No current facility-administered medications on file prior to visit.    ROS:  Out of a complete 14 system review of symptoms, the patient complains only of the following symptoms, and all other reviewed systems are negative.  Fatigue Swelling the legs Rash, itching Loss of vision Minimal urinary incontinence Joint pain, joint swelling Memory loss, headache, seizure Insomnia, decreased energy  Blood pressure 131/82, pulse 74, weight 294 lb (133.358 kg).  Physical Exam  General: The patient is alert and cooperative at the time of the examination.  Skin: No significant peripheral edema is noted.   Neurologic Exam  Cranial nerves: Facial symmetry is present. Speech is normal, no aphasia or dysarthria is noted. Extraocular movements are full. Visual fields are full.  Motor: The patient has good strength in all 4 extremities.  Coordination: The patient has good finger-nose-finger and heel-to-shin  bilaterally.  Gait and station: The patient has a normal gait. Tandem gait is normal. Romberg is negative. No drift is seen.  Reflexes: Deep tendon reflexes are symmetric.   Assessment/Plan:  1. Obesity  2. Psoriasis, psoriatic arthritis  3. Sleep apnea  4. Migraine headache  5. History seizures  The patient has had some recent seizure events. The patient will go up on the Topamax dose taking 50 mg in the morning, and 100 mg in the evening. The patient will contact me if she has cognitive side effects. The patient  should not be driving for least 6 months following the most recent seizure. The patient will followup through this office in 6 months.  Marlan Palau MD 12/09/2012 7:41 PM  [facility] [address] [place], [place] [address]  Phone [phone] Fax [phone]

## 2013-06-14 ENCOUNTER — Encounter: Payer: Self-pay | Admitting: Neurology

## 2013-06-14 ENCOUNTER — Ambulatory Visit (INDEPENDENT_AMBULATORY_CARE_PROVIDER_SITE_OTHER): Payer: 59 | Admitting: Neurology

## 2013-06-14 ENCOUNTER — Encounter (INDEPENDENT_AMBULATORY_CARE_PROVIDER_SITE_OTHER): Payer: Self-pay

## 2013-06-14 ENCOUNTER — Telehealth: Payer: Self-pay | Admitting: *Deleted

## 2013-06-14 VITALS — BP 126/81 | HR 77 | Wt 296.0 lb

## 2013-06-14 DIAGNOSIS — G43909 Migraine, unspecified, not intractable, without status migrainosus: Secondary | ICD-10-CM

## 2013-06-14 DIAGNOSIS — G40209 Localization-related (focal) (partial) symptomatic epilepsy and epileptic syndromes with complex partial seizures, not intractable, without status epilepticus: Secondary | ICD-10-CM

## 2013-06-14 NOTE — Progress Notes (Signed)
Reason for visit: Seizures  Tiffany Wilkins is an 48 y.o. female  History of present illness:  Tiffany Wilkins is a 48 year old right-handed white female with a history of migraine headaches and a history of seizures. The patient has had blackouts in the past, but this has not been the case for almost a year. The patient is on Topamax, and the dose was increased to 50 mg in the morning and 100 mg in the evening. The patient is having some cognitive side effects on the medication. The patient is still working. The patient has a lot of joint pain associated with her psoriatic arthritis. The patient has sleep apnea, on CPAP, and she indicates that she oftentimes will wake up with the mask off of her face. The patient indicates that she has had several recent seizures within the last month. The seizures were associated with dizziness, dj vu sensations, and nausea. The patient may have a headache after the event. The patient does not lose consciousness or have true confusion during the events. The patient is operating a motor vehicle. The patient returns for an evaluation.  Past Medical History  Diagnosis Date  . Seizure disorder     f/b Dr. Jannifer Franklin  . Asthma     related to allergies and colds  . Chronic bronchitis     gets bronchitis yearly with colds  . Sleep apnea     inconclusive test per pt (some central and obstructive component)  . Impaired fasting glucose   . Arthritis     inflammatory and psoriatic--Dr. Estanislado Pandy  . Hernia (acquired) (recurrent)     R lower abdomen; has seen [facility] (Dr. Zettie Pho)  . Anal fissure   . Chronic kidney disease kidney stones  . Seizures last seisure june 2012  . Tinea cruris 7/09  . Internal hemorrhoids 03/2011  . Hiatal hernia 03/2011  . Reflux esophagitis 03/2011  . GERD (gastroesophageal reflux disease)   . Allergic rhinitis, cause unspecified   . Menstrual migraine     Dr, Jannifer Franklin  . Sleep apnea 06/07/09    mild complex sleep apnea, worse in supine position    . Obesity     Past Surgical History  Procedure Laterality Date  . Cesarean section      x2  . Cholecystectomy  2005  . Repair of incisional hernia  2005, 2008    related to laparoscopic cholecystectomy  . Kidney stone surgery  08/2010    retrieved with basket (at [facility])  . Colonoscopy  03/19/11    Dr. Olevia Perches; internal hemorrhoids  . Esophagogastroduodenoscopy  03/19/11    hiatal hernia, esophagitis    Family History  Problem Relation Age of Onset  . Arthritis Mother     rheumatoid  . Hyperlipidemia Mother   . Hypertension Mother   . Hyperthyroidism Mother     s/p thyroidectomy, now with hypothyroidism  . Cancer Father 43    AML, lung cancer, kidney cancer (all presented at same time)  . Diabetes Father   . Hypertension Father   . Hyperlipidemia Father   . Psoriasis Father   . Anxiety disorder Father     OCD, nervous breakdown  . Psoriasis Sister   . Irritable bowel syndrome Sister   . Arthritis Sister     psoriatic  . Diabetes Sister   . GER disease Daughter   . Asthma Son   . Diabetes Paternal Uncle   . Crohn's disease Paternal Uncle   . Cancer Paternal Uncle     ?  type  . Colitis Paternal Uncle   . Asthma Maternal Grandmother   . Hyperlipidemia Maternal Grandmother   . Heart disease Maternal Grandmother   . Stroke Maternal Grandmother   . Diabetes Maternal Grandfather   . Heart disease Maternal Grandfather   . Cancer Paternal Grandmother 49    female (?ovarian)  . Diabetes Paternal Grandmother   . Hypertension Paternal Grandmother   . Diabetes Paternal Grandfather   . Heart disease Paternal Grandfather   . Hypertension Paternal Grandfather   . Esophageal cancer Neg Hx   . Stomach cancer Neg Hx     Social history:  reports that she has never smoked. She has never used smokeless tobacco. She reports that she does not drink alcohol or use illicit drugs.    Allergies  Allergen Reactions  . Amoxicillin Hives  . Codeine Itching and Other (See  Comments)    Heart races.  . Keppra [Levetiracetam]     Suicide ideation  . Ketorolac Tromethamine Other (See Comments)    Stopped breathing.  . Sulfa Antibiotics Nausea Only  . Transderm-Scop [Scopolamine] Other (See Comments)    Stopped breathing.    Medications:  Current Outpatient Prescriptions on File Prior to Visit  Medication Sig Dispense Refill  . albuterol (PROVENTIL HFA;VENTOLIN HFA) 108 (90 BASE) MCG/ACT inhaler Inhale 2 puffs into the lungs as needed.        . fluconazole (DIFLUCAN) 150 MG tablet Take 1 tablet (150 mg total) by mouth once.  14 tablet  0  . folic acid (FOLVITE) 1 MG tablet Take 1 mg by mouth daily.        . Golimumab (SIMPONI [place]) Inject into the skin every 30 (thirty) days.        Marland Kitchen ibuprofen (ADVIL,MOTRIN) 200 MG tablet Take 800 mg by mouth 2 (two) times daily.        . methotrexate (RHEUMATREX) 2.5 MG tablet Take 17.5 mg by mouth once a week. Caution:Chemotherapy. Protect from light.       . topiramate (TOPAMAX) 50 MG tablet One tablet in the morning and two tablets at night  270 tablet  1  . dicyclomine (BENTYL) 20 MG tablet Take 1 tablet (20 mg total) by mouth 3 (three) times daily as needed.  60 tablet  0   No current facility-administered medications on file prior to visit.    ROS:  Out of a complete 14 system review of symptoms, the patient complains only of the following symptoms, and all other reviewed systems are negative.  Fatigue Shortness of breath Leg swelling Cold intolerance Frequent waking Joint pain, back pain, joint swelling, walking difficulties Memory loss, seizures, weakness  Blood pressure 126/81, pulse 77, weight 296 lb (134.265 kg).  Physical Exam  General: The patient is alert and cooperative at the time of the examination. The patient is markedly obese.  Skin: 2+ edema below the knees is noted bilaterally.   Neurologic Exam  Mental status: The Mini-Mental status examination done today shows a total score  25/30.  Cranial nerves: Facial symmetry is present. Speech is normal, no aphasia or dysarthria is noted. Extraocular movements are full. Visual fields are full.  Motor: The patient has good strength in all 4 extremities.  Sensory examination: Soft touch sensation is symmetric on the face, arms, and legs.  Coordination: The patient has good finger-nose-finger and heel-to-shin bilaterally.  Gait and station: The patient has a normal gait. Tandem gait is normal. Romberg is negative. No drift is seen.  Reflexes:  Deep tendon reflexes are symmetric.   Assessment/Plan:  1. History of partial complex seizures  2. Psoriatic arthritis  3. Memory disturbance  4. Migraine headache  5. Obesity  The patient is having some seizure-type events, and she indicates that these tend to occur when she is stressed out, not sleeping well, or sick for some other reason. The patient will be continued on Topamax, she is not enthusiastic about trying another medication such as Vimpat. The patient will be sent for further blood work looking for other etiologies of memory problems, but the cognitive issues are likely related to the Topamax. The patient also has sleep apnea on CPAP. The patient will followup in 6 months.  Jill Alexanders MD 06/14/2013 8:53 PM  [facility] [address] [place], [place] [address]  Phone [phone] Fax [phone]

## 2013-06-14 NOTE — Telephone Encounter (Signed)
Pt called and is questioning the lab tests ordered.   She stated that there is no need to do the RPR, and HIV.  Wanted to cancel them.  I instructed that due to her memory issues, these tests that were ordered, are a standard panel to rule out possible causes of memory loss.  I tried to reassure her, then she mentioned cost and wanted them cancelled.  Call her mobile when results come in.

## 2013-06-14 NOTE — Patient Instructions (Signed)
Epilepsy Epilepsy is a disorder in which a person has repeated seizures over time. A seizure is a release of abnormal electrical activity in the brain. Seizures can cause a change in attention, behavior, or the ability to remain awake and alert (altered mental status). Seizures often involve uncontrollable shaking (convulsions).  Most people with epilepsy lead normal lives. However, people with epilepsy are at an increased risk of falls, accidents, and injuries. Therefore, it is important to begin treatment right away. CAUSES  Epilepsy has many possible causes. Anything that disturbs the normal pattern of brain cell activity can lead to seizures. This may include:   Head injury.  Birth trauma.  High fever as a child.  Stroke.  Bleeding into or around the brain.  Certain drugs.  Prolonged low oxygen, such as what occurs after CPR efforts.  Abnormal brain development.  Certain illnesses, such as meningitis, encephalitis (brain infection), malaria, and other infections.  An imbalance of nerve signaling chemicals (neurotransmitters).  SIGNS AND SYMPTOMS  The symptoms of a seizure can vary greatly from one person to another. Right before a seizure, you may have a warning (aura) that a seizure is about to occur. An aura may include the following symptoms:  Fear or anxiety.  Nausea.  Feeling like the room is spinning (vertigo).  Vision changes, such as seeing flashing lights or spots. Common symptoms during a seizure include:  Abnormal sensations, such as an abnormal smell or a bitter taste in the mouth.   Sudden, general body stiffness.   Convulsions that involve rhythmic jerking of the face, arm, or leg on one or both sides.   Sudden change in consciousness.   Appearing to be awake but not responding.   Appearing to be asleep but cannot be awakened.   Grimacing, chewing, lip smacking, drooling, tongue biting, or loss of bowel or bladder control. After a seizure,  you may feel sleepy for a while. DIAGNOSIS  Your health care provider will ask about your symptoms and take a medical history. Descriptions from any witnesses to your seizures will be very helpful in the diagnosis. A physical exam, including a detailed neurological exam, is necessary. Various tests may be done, such as:   An electroencephalogram (EEG). This is a painless test of your brain waves. In this test, a diagram is created of your brain waves. These diagrams can be interpreted by a specialist.  An MRI of the brain.   A CT scan of the brain.   A spinal tap (lumbar puncture, LP).  Blood tests to check for signs of infection or abnormal blood chemistry. TREATMENT  There is no cure for epilepsy, but it is generally treatable. Once epilepsy is diagnosed, it is important to begin treatment as soon as possible. For most people with epilepsy, seizures can be controlled with medicines. The following may also be used:  A pacemaker for the brain (vagus nerve stimulator) can be used for people with seizures that are not well controlled by medicine.  Surgery on the brain. For some people, epilepsy eventually goes away. HOME CARE INSTRUCTIONS   Follow your health care provider's recommendations on driving and safety in normal activities.  Get enough rest. Lack of sleep can cause seizures.  Only take over-the-counter or prescription medicines as directed by your health care provider. Take any prescribed medicine exactly as directed.  Avoid any known triggers of your seizures.  Keep a seizure diary. Record what you recall about any seizure, especially any possible trigger.   Make   sure the people you live and work with know that you are prone to seizures. They should receive instructions on how to help you. In general, a witness to a seizure should:   Cushion your head and body.   Turn you on your side.   Avoid unnecessarily restraining you.   Not place anything inside your  mouth.   Call for emergency medical help if there is any question about what has occurred.   Follow up with your health care provider as directed. You may need regular blood tests to monitor the levels of your medicine.  SEEK MEDICAL CARE IF:   You develop signs of infection or other illness. This might increase the risk of a seizure.   You seem to be having more frequent seizures.   Your seizure pattern is changing.  SEEK IMMEDIATE MEDICAL CARE IF:   You have a seizure that does not stop after a few moments.   You have a seizure that causes any difficulty in breathing.   You have a seizure that results in a very severe headache.   You have a seizure that leaves you with the inability to speak or use a part of your body.  Document Released: 03/02/2005 Document Revised: 12/21/2012 Document Reviewed: 10/12/2012 [employer] Patient Information 2014 [employer].  

## 2013-06-14 NOTE — Telephone Encounter (Signed)
I relayed to Dr. Jannifer Franklin.

## 2013-06-15 ENCOUNTER — Telehealth: Payer: Self-pay | Admitting: Neurology

## 2013-06-15 NOTE — Telephone Encounter (Signed)
Patient calling to state she would like her lab results before she leaves the country today, please call patient and advise.

## 2013-06-15 NOTE — Telephone Encounter (Signed)
I called patient. The blood work is all back with exception of a copper level. Everything else is normal.

## 2013-06-16 LAB — COPPER, SERUM: Copper: 124 ug/dL (ref 72–166)

## 2013-06-16 LAB — VITAMIN B12: Vitamin B-12: 280 pg/mL (ref 211–946)

## 2013-06-16 LAB — TSH: TSH: 2.39 u[IU]/mL (ref 0.450–4.500)

## 2013-06-16 LAB — RPR: SYPHILIS RPR SCR: NONREACTIVE

## 2013-06-16 LAB — HIV ANTIBODY (ROUTINE TESTING W REFLEX): HIV-1/HIV-2 Ab: NONREACTIVE

## 2013-09-19 ENCOUNTER — Telehealth: Payer: Self-pay | Admitting: Neurology

## 2013-09-19 MED ORDER — TOPIRAMATE 50 MG PO TABS: ORAL_TABLET | ORAL | Status: DC

## 2013-09-19 NOTE — Telephone Encounter (Signed)
Patient requesting a week supply of  topiramate (TOPAMAX) 50 MG tablet.  Please send to [facility] and let her know when Rx has been sent in.  She can't go to work until she takes medication, ran out of meds yesterday.  Please call and advise.

## 2013-09-19 NOTE — Telephone Encounter (Signed)
Patient is requesting a small Rx be sent to the local pharmacy because she is waiting on her meds via mail order.  Rx has been sent.  I called back, got no answer.  Left message.

## 2013-11-21 ENCOUNTER — Encounter: Payer: Self-pay | Admitting: *Deleted

## 2013-11-29 ENCOUNTER — Ambulatory Visit (INDEPENDENT_AMBULATORY_CARE_PROVIDER_SITE_OTHER): Payer: 59 | Admitting: Family Medicine

## 2013-11-29 ENCOUNTER — Other Ambulatory Visit (HOSPITAL_COMMUNITY)
Admission: RE | Admit: 2013-11-29 | Discharge: 2013-11-29 | Disposition: A | Discharge status: Home / Self Care | Payer: 59 | Source: Ambulatory Visit | Attending: Family Medicine | Admitting: Family Medicine

## 2013-11-29 ENCOUNTER — Encounter: Payer: Self-pay | Admitting: Family Medicine

## 2013-11-29 VITALS — BP 128/92 | HR 76 | Ht 63.0 in | Wt 296.0 lb

## 2013-11-29 DIAGNOSIS — G40209 Localization-related (focal) (partial) symptomatic epilepsy and epileptic syndromes with complex partial seizures, not intractable, without status epilepticus: Secondary | ICD-10-CM

## 2013-11-29 DIAGNOSIS — Z79899 Other long term (current) drug therapy: Secondary | ICD-10-CM

## 2013-11-29 DIAGNOSIS — Z1151 Encounter for screening for human papillomavirus (HPV): Secondary | ICD-10-CM | POA: Insufficient documentation

## 2013-11-29 DIAGNOSIS — G4733 Obstructive sleep apnea (adult) (pediatric): Secondary | ICD-10-CM

## 2013-11-29 DIAGNOSIS — L408 Other psoriasis: Secondary | ICD-10-CM

## 2013-11-29 DIAGNOSIS — Z23 Encounter for immunization: Secondary | ICD-10-CM

## 2013-11-29 DIAGNOSIS — R1903 Right lower quadrant abdominal swelling, mass and lump: Secondary | ICD-10-CM

## 2013-11-29 DIAGNOSIS — K439 Ventral hernia without obstruction or gangrene: Secondary | ICD-10-CM

## 2013-11-29 DIAGNOSIS — Z01419 Encounter for gynecological examination (general) (routine) without abnormal findings: Secondary | ICD-10-CM | POA: Insufficient documentation

## 2013-11-29 DIAGNOSIS — Z6841 Body Mass Index (BMI) 40.0 and over, adult: Secondary | ICD-10-CM

## 2013-11-29 DIAGNOSIS — Z0000 Encounter for general adult medical examination without abnormal findings: Secondary | ICD-10-CM

## 2013-11-29 DIAGNOSIS — Z9989 Dependence on other enabling machines and devices: Secondary | ICD-10-CM

## 2013-11-29 DIAGNOSIS — L409 Psoriasis, unspecified: Secondary | ICD-10-CM

## 2013-11-29 DIAGNOSIS — L4050 Arthropathic psoriasis, unspecified: Secondary | ICD-10-CM

## 2013-11-29 DIAGNOSIS — G43909 Migraine, unspecified, not intractable, without status migrainosus: Secondary | ICD-10-CM

## 2013-11-29 LAB — POCT URINALYSIS DIPSTICK
Bilirubin, UA: NEGATIVE
Glucose, UA: NEGATIVE
KETONES UA: NEGATIVE
LEUKOCYTES UA: NEGATIVE
NITRITE UA: NEGATIVE
PROTEIN UA: NEGATIVE
Spec Grav, UA: 1.02
UROBILINOGEN UA: NEGATIVE
pH, UA: 5

## 2013-11-29 LAB — BASIC METABOLIC PANEL
BUN: 10 mg/dL (ref 6–23)
CHLORIDE: 103 meq/L (ref 96–112)
CO2: 25 mEq/L (ref 19–32)
Calcium: 9.3 mg/dL (ref 8.4–10.5)
Creat: 0.74 mg/dL (ref 0.50–1.10)
Glucose, Bld: 88 mg/dL (ref 70–99)
POTASSIUM: 4 meq/L (ref 3.5–5.3)
SODIUM: 137 meq/L (ref 135–145)

## 2013-11-29 LAB — LIPID PANEL
Cholesterol: 178 mg/dL (ref 0–200)
HDL: 52 mg/dL (ref >39)
LDL CALC: 102 mg/dL — AB (ref 0–99)
Total CHOL/HDL Ratio: 3.4 Ratio
Triglycerides: 122 mg/dL (ref <150)
VLDL: 24 mg/dL (ref 0–40)

## 2013-11-29 NOTE — Patient Instructions (Addendum)
  HEALTH MAINTENANCE RECOMMENDATIONS:  It is recommended that you get at least 30 minutes of aerobic exercise at least 5 days/week (for weight loss, you may need as much as 60-90 minutes). This can be any activity that gets your heart rate up. This can be divided in 10-15 minute intervals if needed, but try and build up your endurance at least once a week.  Weight bearing exercise is also recommended twice weekly.  Eat a healthy diet with lots of vegetables, fruits and fiber.  "Colorful" foods have a lot of vitamins (ie green vegetables, tomatoes, red peppers, etc).  Limit sweet tea, regular sodas and alcoholic beverages, all of which has a lot of calories and sugar.  Up to 1 alcoholic drink daily may be beneficial for women (unless trying to lose weight, watch sugars).  Drink a lot of water.  Calcium recommendations are 1200-1500 mg daily (1500 mg for postmenopausal women or women without ovaries), and vitamin D 1000 IU daily.  This should be obtained from diet and/or supplements (vitamins), and calcium should not be taken all at once, but in divided doses.  Monthly self breast exams and yearly mammograms for women over the age of 62 is recommended.  Sunscreen of at least SPF 30 should be used on all sun-exposed parts of the skin when outside between the hours of 10 am and 4 pm (not just when at beach or pool, but even with exercise, golf, tennis, and yard work!)  Use a sunscreen that says "broad spectrum" so it covers both UVA and UVB rays, and make sure to reapply every 1-2 hours.  Remember to change the batteries in your smoke detectors when changing your clock times in the spring and fall.  Use your seat belt every time you are in a car, and please drive safely and not be distracted with cell phones and texting while driving.   Yearly mammograms are recommended--if you can't get at work, please schedule through the [place]. Routine eye exam recommended Routine dental cleanings are  recommended twice yearly. Consider water aerobics

## 2013-11-29 NOTE — Progress Notes (Signed)
Chief Complaint  Patient presents with  . Annual Exam    fasting annual exam with pap. Did want to make sure that you document her psoriatic arthirits. Was told to come her by her rheumatologist to make this known with PCP in case she needs to apply disablilty in the future. UA  showed trace blood-no symptoms.    Tiffany Wilkins is a 48 y.o. female who presents for a complete physical.  She has the following concerns:  She is afraid of potentially losing her job due to progression of arthritis problems. Rheumatologist told her to mention to [place], as she will be seeking disability, and is related to a combination of her neuro issues (seizures, migraines, meds) and her psoriatic arthritis.  She feels very fatigued.  Does nothing when she gets home from work and sleeps all weekend long. Has some memory issues which might be affecting her work (evaluated by neuro).  She is followed by [facility] for migraines and seizures, last seen 06/2013  Immunization History  Administered Date(s) Administered  . Influenza Split 12/29/2010  she has refused flu shots in the past Last Pap smear:  More than 5 years ago Last mammogram: 07/2010 at [facility] Last colonoscopy: 03/2011 Dr. Olevia Perches (internal hemorrhoids) Last DEXA: never.  She has taken prednisone and dilantin (as high risk med) in the past. Dentist: every other year Ophtho: only once in her life Exercise: none.  Limited by pain--can hardly walk.  She sleeps all weekend--methotrexate makes her sleepy  Lipids:  2012: Lab Results  Component Value Date   CHOL 193 01/22/2011   Lab Results  Component Value Date   HDL 43 01/22/2011   Lab Results  Component Value Date   LDLCALC 127* 01/22/2011   Lab Results  Component Value Date   TRIG 114 01/22/2011   Lab Results  Component Value Date   CHOLHDL 4.5 01/22/2011   No results found for this basename: LDLDIRECT     ROS:  The patient denies anorexia, fever, weight changes, vision changes (just  needing reading glasses), decreased hearing, ear pain, sore throat, breast concerns, chest pain, palpitations, dizziness, syncope, dyspnea on exertion, cough, swelling, nausea, vomiting, diarrhea, constipation, abdominal pain, melena, hematochezia, indigestion/heartburn, hematuria,, dysuria, irregular menstrual cycles (can be heavy), vaginal discharge, odor or itch, genital lesions, numbness, tingling, weakness, tremor, suspicious skin lesions, depression, anxiety, abnormal bleeding/bruising, or enlarged lymph nodes. +Memory problems Weight gain (30 pounds) fro 2013-2014; no recent change. +fatigue Headaches related to menstrual cycles Incontinence since her daughter was born (urge)joint pains--fingers/hands, feet/ankles, knees. Denies back pain.  PHYSICAL EXAM:  BP 128/92  Pulse 76  Ht 5' 3"  (1.6 m)  Wt 296 lb (134.265 kg)  BMI 52.45 kg/m2  LMP 11/15/2013  General Appearance:    Alert, cooperative, no distress, appears stated age  Head:    Normocephalic, without obvious abnormality, atraumatic  Eyes:    PERRL, conjunctiva/corneas clear, EOM's intact, fundi    benign  Ears:    Normal TM's and external ear canals  Nose:   Nares normal, mucosa normal, no drainage or sinus   tenderness  Throat:   Lips, mucosa, and tongue normal; teeth and gums normal  Neck:   Supple, no lymphadenopathy;  thyroid:  no   enlargement/tenderness/nodules; no carotid   bruit or JVD  Back:    Spine nontender, no curvature, ROM normal, no CVA     tenderness  Lungs:     Clear to auscultation bilaterally without wheezes, rales or  ronchi; respirations unlabored  Chest Wall:    No tenderness or deformity   Heart:    Regular rate and rhythm, S1 and S2 normal, no murmur, rub   or gallop  Breast Exam:    No tenderness, masses, or nipple discharge or inversion.      No axillary lymphadenopathy  Abdomen:     Soft, normoactive bowel sounds, no hepatosplenomegaly; obesity of lower abdomen is asymmetric with significant  swelling on R abdomen, nontender, but not really reducible or improved when lying down.   Genitalia:    Normal external genitalia without lesions.  BUS and vagina normal; cervix without lesions, or cervical motion tenderness. No abnormal vaginal discharge. Bimanual exam is severely limited due to body habitus.  No masses are appreciable.  Pap performed  Rectal:    Normal tone, no masses or tenderness; guaiac negative stool  Extremities:   No clubbing, cyanosis or edema  Pulses:   2+ and symmetric all extremities  Skin:   Skin color, texture, turgor normal.  +psoriatic plaque at umbilicus.  Lymph nodes:   Cervical, supraclavicular, and axillary nodes normal  Neurologic:   CNII-XII intact, normal strength, sensation and gait; reflexes 2+ and symmetric throughout          Psych:   Normal mood, affect, hygiene and grooming.    Urine dip:  Trace blood  Lab Results  Component Value Date   TSH 2.390 06/14/2013   HIV and RPR also negative on 06/14/13 She reports getting CBC's and LFT's monitored by Dr. Arlean Hopping office regularly (results not available).  ASSESSMENT/PLAN:  Routine general medical examination at a health care facility - Plan: Visual acuity screening, POCT Urinalysis Dipstick, Lipid panel, Vit D  25 hydroxy (rtn osteoporosis monitoring), Basic metabolic panel, Cytology - PAP [place], Tdap vaccine greater than or equal to 7yo IM  Encounter for long-term (current) use of other medications - Plan: Basic metabolic panel  Need for Tdap vaccination - Plan: Tdap vaccine greater than or equal to 7yo IM  Abdominal right lower quadrant swelling - suspect recurrent hernia. Given large size, and discomfort with standing/walking, rec eval with u/s, and possible refer back to surgeon - Plan: US Abdomen Complete  Need for prophylactic vaccination and inoculation against influenza - Plan: Flu Vaccine QUAD 36+ mos PF IM (Fluarix Quad PF)  Psoriasis - noted on abdomen and gluteal  cleft  Psoriatic arthritis - managed by Dr. Estanislado Pandy.  some medication side effects per pt  Hernia of abdominal wall  OSA on CPAP - per Dr. Jannifer Franklin  Partial symptomatic epilepsy with complex partial seizures, not intractable, without status epilepticus - controlled  Migraine, unspecified, without mention of intractable migraine without mention of status migrainosus - controlled  Morbid obesity with BMI of 50.0-59.9, adult - discussed risks of obesity, need for regular exercise and weight loss   Recurrent abdominal hernia--check u/s and refer back to surgeon  Discussed monthly self breast exams and yearly mammograms after the age of 76; at least 30 minutes of aerobic activity at least 5 days/week; proper sunscreen use reviewed; healthy diet, including goals of calcium and vitamin D intake and alcohol recommendations (less than or equal to 1 drink/day) reviewed; regular seatbelt use; changing batteries in smoke detectors.  Immunization recommendations discussed--Tdap and flu shot today.  Colonoscopy recommendations reviewed--UTD  Plans to get mammogram at work when they come (has missed opportunities in the past--she needs to schedule elsewhere if unable to get at work).  Consider DEXA age 69,  possibly sooner if +Vitamin D deficiency  Vit D, b-met and lipids today.  Yearly mammograms are recommended--if you can't get at work, please schedule through the [facility]. Routine eye exam recommended Routine dental cleanings are recommended twice yearly. Consider water aerobics

## 2013-11-30 ENCOUNTER — Encounter: Payer: Self-pay | Admitting: Family Medicine

## 2013-11-30 LAB — VITAMIN D 25 HYDROXY (VIT D DEFICIENCY, FRACTURES): VIT D 25 HYDROXY: 34 ng/mL (ref 30–89)

## 2013-12-01 ENCOUNTER — Encounter: Payer: Self-pay | Admitting: Family Medicine

## 2013-12-01 LAB — CYTOLOGY - PAP

## 2013-12-05 ENCOUNTER — Encounter: Payer: Self-pay | Admitting: Nurse Practitioner

## 2013-12-05 ENCOUNTER — Ambulatory Visit (INDEPENDENT_AMBULATORY_CARE_PROVIDER_SITE_OTHER): Payer: 59 | Admitting: Nurse Practitioner

## 2013-12-05 VITALS — BP 127/84 | HR 70 | Ht 63.0 in | Wt 295.6 lb

## 2013-12-05 DIAGNOSIS — G40209 Localization-related (focal) (partial) symptomatic epilepsy and epileptic syndromes with complex partial seizures, not intractable, without status epilepticus: Secondary | ICD-10-CM

## 2013-12-05 DIAGNOSIS — G43909 Migraine, unspecified, not intractable, without status migrainosus: Secondary | ICD-10-CM

## 2013-12-05 MED ORDER — TOPIRAMATE 50 MG PO TABS: ORAL_TABLET | ORAL | Status: DC

## 2013-12-05 NOTE — Progress Notes (Signed)
I have read the note, and I agree with the clinical assessment and plan.  WILLIS,CHARLES KEITH   

## 2013-12-05 NOTE — Progress Notes (Signed)
[facility]  PATIENT: Tiffany Wilkins DOB: [birth year]   REASON FOR VISIT: Followup for seizure disorder   HISTORY OF PRESENT ILLNESS: [name], 48 year old female returns for followup. She was last seen by Dr. Jannifer Franklin on 06/14/2013. She claims she has had one episode of dizziness and dj vu since last seen. She does not lose consciousness  or have confusion during the events. It was during a time she was extremely stressed. She remains on Topamax 50 in the morning and 100 at night. She has some mild cognitive defects on the medication but does not wish to change to a different seizure drug. She has been on Carbatrol, Trileptal, and Dilantin in the past with either breakthrough seizures or side effects. She also has obstructive sleep apnea and did not followup for her appointment with Dr. Brett Fairy in April. She claims she has not used her CPAP very much as the mask does not fit well. Her last download was over a year ago. She returns for reevaluation  HISTORY: [name] is a 48 year old right-handed white female with a history of migraine headaches and a history of seizures. The patient has had blackouts in the past, but this has not been the case for almost a year. The patient is on Topamax, and the dose was increased to 50 mg in the morning and 100 mg in the evening. The patient is having some cognitive side effects on the medication. The patient is still working. The patient has a lot of joint pain associated with her psoriatic arthritis. The patient has sleep apnea, on CPAP, and she indicates that she oftentimes will wake up with the mask off of her face. The patient indicates that she has had several recent seizures within the last month. The seizures were associated with dizziness, dj vu sensations, and nausea. The patient may have a headache after the event. The patient does not lose consciousness or have true confusion during the events. The patient is operating a motor  vehicle. The patient returns for an evaluation.    REVIEW OF SYSTEMS: Full 14 system review of systems performed and notable only for those listed, all others are neg:  Constitutional:  fatigue  Cardiovascular:  leg swelling  Ear/Nose/Throat: N/A  Skin: N/A  Eyes: N/A  Respiratory: N/A  Gastroitestinal: N/A  Hematology/Lymphatic: N/A  Endocrine: N/A Musculoskeletal:N/A  Allergy/Immunology: N/A  Neurological:  seizure disorder , memory loss Psychiatric: N/A Sleep : Obstructive sleep apnea , frequent wakening   ALLERGIES: Allergies  Allergen Reactions  . Amoxicillin Hives  . Codeine Itching and Other (See Comments)    Heart races.  . Keppra [Levetiracetam]     Suicide ideation  . Ketorolac Tromethamine Other (See Comments)    Stopped breathing.  . Sulfa Antibiotics Nausea Only  . Transderm-Scop [Scopolamine] Other (See Comments)    Stopped breathing.    HOME MEDICATIONS: Outpatient Prescriptions Prior to Visit  Medication Sig Dispense Refill  . albuterol (PROVENTIL HFA;VENTOLIN HFA) 108 (90 BASE) MCG/ACT inhaler Inhale 2 puffs into the lungs as needed.        . fluconazole (DIFLUCAN) 150 MG tablet Take 1 tablet (150 mg total) by mouth once.  14 tablet  0  . folic acid (FOLVITE) 1 MG tablet Take 2 mg by mouth daily.       . Golimumab (SIMPONI [place]) Inject into the skin every 30 (thirty) days.        Marland Kitchen ibuprofen (ADVIL,MOTRIN) 200 MG tablet Take 400 mg by mouth  daily.       . methotrexate (RHEUMATREX) 2.5 MG tablet Take 15 mg by mouth once a week. Caution:Chemotherapy. Protect from light.      . topiramate (TOPAMAX) 50 MG tablet One tablet in the morning and two tablets at night  45 tablet  1   No facility-administered medications prior to visit.    PAST MEDICAL HISTORY: Past Medical History  Diagnosis Date  . Seizure disorder     f/b Dr. Jannifer Franklin  . Asthma     related to allergies and colds  . Chronic bronchitis     gets bronchitis yearly with colds  . Sleep apnea       inconclusive test per pt (some central and obstructive component)  . Impaired fasting glucose   . Hernia (acquired) (recurrent)     R lower abdomen; has seen [facility] (Dr. Zena Amos and recurred  . Anal fissure   . Chronic kidney disease kidney stones  . Seizures last seisure june 2012  . Tinea cruris 7/09  . Internal hemorrhoids 03/2011  . Hiatal hernia 03/2011  . Reflux esophagitis 03/2011  . GERD (gastroesophageal reflux disease)   . Allergic rhinitis, cause unspecified   . Menstrual migraine     Dr, Jannifer Franklin  . Sleep apnea 06/07/09    mild complex sleep apnea, worse in supine position (uses CPAP intermittently only)  . Obesity   . Arthritis     inflammatory and psoriatic--Dr. Estanislado Pandy    PAST SURGICAL HISTORY: Past Surgical History  Procedure Laterality Date  . Cesarean section      x2  . Cholecystectomy  2005  . Repair of incisional hernia  2005, 2008    related to laparoscopic cholecystectomy  . Kidney stone surgery  08/2010    ret[facility] with basket ([facility])  . Colonoscopy  03/19/11    Dr. Olevia Perches; internal hemorrhoids  . Esophagogastroduodenoscopy  03/19/11    hiatal hernia, esophagitis    FAMILY HISTORY: Family History  Problem Relation Age of Onset  . Arthritis Mother     rheumatoid  . Hyperlipidemia Mother   . Hypertension Mother   . Hyperthyroidism Mother     s/p thyroidectomy, now with hypothyroidism  . Cancer Father 69    AML, lung cancer, kidney cancer (all presented at same time)  . Diabetes Father   . Hypertension Father   . Hyperlipidemia Father   . Psoriasis Father   . Anxiety disorder Father     OCD, nervous breakdown  . Psoriasis Sister   . Irritable bowel syndrome Sister   . Arthritis Sister     psoriatic  . Diabetes Sister   . GER disease Daughter   . Asthma Son   . Diabetes Paternal Uncle   . Crohn's disease Paternal Uncle   . Cancer Paternal Uncle     ? type  . Colitis Paternal Uncle   . Asthma Maternal Grandmother   .  Hyperlipidemia Maternal Grandmother   . Heart disease Maternal Grandmother   . Stroke Maternal Grandmother   . Diabetes Maternal Grandfather   . Heart disease Maternal Grandfather   . Cancer Paternal Grandmother 55    female (?ovarian)  . Diabetes Paternal Grandmother   . Hypertension Paternal Grandmother   . Diabetes Paternal Grandfather   . Heart disease Paternal Grandfather   . Hypertension Paternal Grandfather   . Esophageal cancer Neg Hx   . Stomach cancer Neg Hx   . Stroke Maternal Aunt     SOCIAL HISTORY:  History   Social History  . Marital Status: Married    Spouse Name: N/A    Number of Children: 2  . Years of Education: hs   Occupational History  . Pattern maker [employer]   Social History Main Topics  . Smoking status: Never Smoker   . Smokeless tobacco: Never Used  . Alcohol Use: No  . Drug Use: No  . Sexual Activity: Yes    Partners: Male    Birth Control/ Protection: Surgical     Comment: husband had vasectomy   Other Topics Concern  . Not on file   Social History Narrative   Lives with husband and 2 children (son and daughter, 66, 68)   Patient is right handed.   Patient has high school education.   Patient drinks 2 cups daily.              PHYSICAL EXAM  Filed Vitals:   12/05/13 0834  BP: 127/84  Pulse: 70  Height: $Remove'5\' 3"'[id number]$  (1.6 m)  Weight: 295 lb 9.6 oz (134.083 kg)   Body mass index is 52.38 kg/(m^2). General: The patient is alert and cooperative at the time of the examination. The patient is markedly obese.  Skin: 2+ edema below the knees is noted bilaterally.  Neurologic Exam  Mental status: The Mini-Mental status examination was not done.FSS was 51. ESS 7.  Cranial nerves: Facial symmetry is present. Speech is normal, no aphasia or dysarthria is noted. Extraocular movements are full. Visual fields are full.  Motor: The patient has good strength in all 4 extremities.  no focal weakness  Sensory examination: Soft touch sensation  is symmetric on the face, arms, and legs.  Coordination: The patient has good finger-nose-finger and heel-to-shin bilaterally.  Gait and station: The patient has a normal gait. Tandem gait is normal. Romberg is negative. No drift is seen.  Reflexes: Deep tendon reflexes are symmetric and depressed .     DIAGNOSTIC DATA (LABS, IMAGING, TESTING) - I reviewed patient records, labs, notes, testing and imaging myself where available.      Component Value Date/Time   NA 137 11/29/2013 1540   K 4.0 11/29/2013 1540   CL 103 11/29/2013 1540   CO2 25 11/29/2013 1540   GLUCOSE 88 11/29/2013 1540   BUN 10 11/29/2013 1540   CREATININE 0.74 11/29/2013 1540   CREATININE 0.56 09/30/2006 1055   CALCIUM 9.3 11/29/2013 1540   GFRNONAA >60 09/30/2006 1055   GFRAA  Value: >60        The eGFR has been calculated using the MDRD equation. This calculation has not been validated in all clinical 09/30/2006 1055   Lab Results  Component Value Date   CHOL 178 11/29/2013   HDL 52 11/29/2013   LDLCALC 102* 11/29/2013   TRIG 122 11/29/2013   CHOLHDL 3.4 11/29/2013    Lab Results  Component Value Date   VITAMINB12 280 06/14/2013   Lab Results  Component Value Date   TSH 2.390 06/14/2013      ASSESSMENT AND PLAN  48 y.o. year old female  has a past medical history of Seizure disorder; complex partial , Asthma; Sleep apnea; Obesity; and Arthritis. here  to followup.  She has had one episode of dizziness and dj vu since last seen. She does not lose consciousness  or have confusion during the events. It was during a time she was extremely stressed. She has failed Dilantin, Carbatrol and Trileptal in the past.   Continue Topamax  at current dose, will refill Continue CPAP at 10cm pressure Past due for follow up visit with Dr. Brett Fairy regarding sleep F/U in 6 months with me Dennie Bible, [facility], [facility], [facility] [address], [place], [place] [address] [phone]

## 2013-12-05 NOTE — Patient Instructions (Signed)
Continue Topamax at current dose Continue CPAP at 10cm pressure Past due for follow up visit with Dr. Brett Fairy regarding sleep F/U in 6 months with me

## 2013-12-07 ENCOUNTER — Other Ambulatory Visit: Payer: Self-pay | Admitting: Family Medicine

## 2013-12-07 ENCOUNTER — Ambulatory Visit
Admission: RE | Admit: 2013-12-07 | Discharge: 2013-12-07 | Disposition: A | Discharge status: Home / Self Care | Payer: 59 | Source: Ambulatory Visit | Attending: Family Medicine | Admitting: Family Medicine

## 2013-12-07 DIAGNOSIS — R1903 Right lower quadrant abdominal swelling, mass and lump: Secondary | ICD-10-CM

## 2013-12-08 ENCOUNTER — Ambulatory Visit (INDEPENDENT_AMBULATORY_CARE_PROVIDER_SITE_OTHER): Payer: 59 | Admitting: Neurology

## 2013-12-08 ENCOUNTER — Encounter: Payer: Self-pay | Admitting: Neurology

## 2013-12-08 VITALS — BP 123/79 | HR 81 | Resp 16 | Ht 63.75 in | Wt 297.0 lb

## 2013-12-08 DIAGNOSIS — R569 Unspecified convulsions: Secondary | ICD-10-CM

## 2013-12-08 DIAGNOSIS — Z91199 Patient's noncompliance with other medical treatment and regimen due to unspecified reason: Secondary | ICD-10-CM

## 2013-12-08 DIAGNOSIS — Z9114 Patient's other noncompliance with medication regimen: Secondary | ICD-10-CM

## 2013-12-08 DIAGNOSIS — G4733 Obstructive sleep apnea (adult) (pediatric): Secondary | ICD-10-CM | POA: Insufficient documentation

## 2013-12-08 DIAGNOSIS — G40909 Epilepsy, unspecified, not intractable, without status epilepticus: Secondary | ICD-10-CM

## 2013-12-08 DIAGNOSIS — E662 Morbid (severe) obesity with alveolar hypoventilation: Secondary | ICD-10-CM | POA: Insufficient documentation

## 2013-12-08 DIAGNOSIS — Z9119 Patient's noncompliance with other medical treatment and regimen: Secondary | ICD-10-CM

## 2013-12-08 NOTE — Patient Instructions (Signed)
Sleep Apnea  Sleep apnea is a sleep disorder characterized by abnormal pauses in breathing while you sleep. When your breathing pauses, the level of oxygen in your blood decreases. This causes you to move out of deep sleep and into light sleep. As a result, your quality of sleep is poor, and the system that carries your blood throughout your body (cardiovascular system) experiences stress. If sleep apnea remains untreated, the following conditions can develop:  High blood pressure (hypertension).  Coronary artery disease.  Inability to achieve or maintain an erection (impotence).  Impairment of your thought process (cognitive dysfunction). There are three types of sleep apnea: 1. Obstructive sleep apnea--Pauses in breathing during sleep because of a blocked airway. 2. Central sleep apnea--Pauses in breathing during sleep because the area of the brain that controls your breathing does not send the correct signals to the muscles that control breathing. 3. Mixed sleep apnea--A combination of both obstructive and central sleep apnea. RISK FACTORS The following risk factors can increase your risk of developing sleep apnea:  Being overweight.  Smoking.  Having narrow passages in your nose and throat.  Being of older age.  Being female.  Alcohol use.  Sedative and tranquilizer use.  Ethnicity. Among individuals younger than 35 years, African Americans are at increased risk of sleep apnea. SYMPTOMS   Difficulty staying asleep.  Daytime sleepiness and fatigue.  Loss of energy.  Irritability.  Loud, heavy snoring.  Morning headaches.  Trouble concentrating.  Forgetfulness.  Decreased interest in sex. DIAGNOSIS  In order to diagnose sleep apnea, your caregiver will perform a physical examination. Your caregiver may suggest that you take a home sleep test. Your caregiver may also recommend that you spend the night in a sleep lab. In the sleep lab, several monitors record  information about your heart, lungs, and brain while you sleep. Your leg and arm movements and blood oxygen level are also recorded. TREATMENT The following actions may help to resolve mild sleep apnea:  Sleeping on your side.   Using a decongestant if you have nasal congestion.   Avoiding the use of depressants, including alcohol, sedatives, and narcotics.   Losing weight and modifying your diet if you are overweight. There also are devices and treatments to help open your airway:  Oral appliances. These are custom-made mouthpieces that shift your lower jaw forward and slightly open your bite. This opens your airway.  Devices that create positive airway pressure. This positive pressure "splints" your airway open to help you breathe better during sleep. The following devices create positive airway pressure:  Continuous positive airway pressure (CPAP) device. The CPAP device creates a continuous level of air pressure with an air pump. The air is delivered to your airway through a mask while you sleep. This continuous pressure keeps your airway open.  Nasal expiratory positive airway pressure (EPAP) device. The EPAP device creates positive air pressure as you exhale. The device consists of single-use valves, which are inserted into each nostril and held in place by adhesive. The valves create very little resistance when you inhale but create much more resistance when you exhale. That increased resistance creates the positive airway pressure. This positive pressure while you exhale keeps your airway open, making it easier to breath when you inhale again.  Bilevel positive airway pressure (BPAP) device. The BPAP device is used mainly in patients with central sleep apnea. This device is similar to the CPAP device because it also uses an air pump to deliver continuous air pressure   through a mask. However, with the BPAP machine, the pressure is set at two different levels. The pressure when you  exhale is lower than the pressure when you inhale.  Surgery. Typically, surgery is only done if you cannot comply with less invasive treatments or if the less invasive treatments do not improve your condition. Surgery involves removing excess tissue in your airway to create a wider passage way. Document Released: 02/20/2002 Document Revised: 06/27/2012 Document Reviewed: 07/09/2011 Exit[employer] Patient Information 2015 [employer]. This information is not intended to replace advice given to you by your health care provider. Make sure you discuss any questions you have with your health care provider.  

## 2013-12-08 NOTE — Progress Notes (Addendum)
SLEEP MEDICINE CLINIC   Provider:  Larey Seat, M D  Referring Provider: Rita Ohara, MD Primary Care Physician:  Vikki Ports, MD  Sleep consultation:   HPI:  Tiffany Wilkins is a 48 y.o. female , who is seen here as a referral from Dr. Benson Norway, Dr.  Tomi Bamberger for a sleep follow up. The patient arrived 15 minutes later than her appointment time.   Dr. Jannifer Franklin referred this patient to back to camp on 06-07-09 for a sleep study. The patient had endorsed a high degree of daytime sleepiness as well as the backs depression inventory at 50 points. Her Epworth score was 14 pints, her BMI is 47.2 the neck circumference 15.5 inches. The patient is a broad area inhaler at that time she was diagnosed as rather mild apnea strong REM dependent the vault AHI was 9.3, the REM dependent AHI was 32.3. She had a brief period of loss desaturation nadir was 76%. The patient was titrated to CPAP at 10 cm water. The Apgar score remained still elevated at 11 points and she remained chronically fatigued in spite of CPAP he was. She also had  bronchitis. In 2- 2014 her CPAP compliance data revealed an AHI of 1.6 at 10 cm water with 3 cm EPR.  she was on nasal pillows and [facility] followed her .  Her compliance was just above compliance  range at 4 hours and 23 minutes nightly use.  This patient has chronic and frequent migraines psoriatic arthritis morbid obesity and seizure disorder. Dr. Jannifer Franklin had hoped that treating the apnea would also will use the patient of some of her headaches. The patient reports that she has a fairly bad head cold and stopped using CPAP after she was not longer monitor at her [place] [employer]. Due to her arthritis and pain she feels that she is struggling with sleep more from the pain comportment that from the apnea component. Husband still reports apnea and snoring being present and he would like her to continue using CPAP.  Again the patient had a mild apnea degree and she may  need a complete new education for apnea, and CPAP use. She will need to see Larwance Sachs today in the sleep lab.  The patient was to bed around 11 PM and it takes her 5 seconds to fall asleep. She begins to snore immediately. She rises  at 6.30 her husband wakes her up, often needs 30 minutes toovercome her stiff joints.  She has at least 3 bathroom breaks. She has a dry mouth in AM and has morning headaches. She falls asleep in church,  not allowed to drive as she is unaware of her sleep attacks.   CPAP -The patient reportedly became immediately non-compliant  04-2012 , after she felt she was no longer monitored.  She stopped using it.      Review of Systems: Out of a complete 14 system review, the patient complains of only the following symptoms, and all other reviewed systems are negative.  Menstrual migraine, eye pain, right temporal,  Epworth score  7 , Fatigue severity score 55 ( the patient calculated 109 ) , depression score not obtained.     History   Social History  . Marital Status: Married    Spouse Name: Tiffany Wilkins    Number of Children: 2  . Years of Education: hs   Occupational History  . Pattern maker [employer]   Social History Main Topics  . Smoking  status: Never Smoker   . Smokeless tobacco: Never Used  . Alcohol Use: No  . Drug Use: No  . Sexual Activity: Yes    Partners: Male    Birth Control/ Protection: Surgical     Comment: husband had vasectomy   Other Topics Concern  . Not on file   Social History Narrative   Patient Lives with husband Tiffany Wilkins)  and 2 children (son and daughter, 34, 31)   Patient is right handed.   Patient has high school education.   Patient drinks 2 cups daily- coffee/soda                Family History  Problem Relation Age of Onset  . Arthritis Mother     rheumatoid  . Hyperlipidemia Mother   . Hypertension Mother   . Hyperthyroidism Mother     s/p thyroidectomy, now with hypothyroidism  . Cancer Father 1     AML, lung cancer, kidney cancer (all presented at same time)  . Diabetes Father   . Hypertension Father   . Hyperlipidemia Father   . Psoriasis Father   . Anxiety disorder Father     OCD, nervous breakdown  . Psoriasis Sister   . Irritable bowel syndrome Sister   . Arthritis Sister     psoriatic  . Diabetes Sister   . GER disease Daughter   . Asthma Son   . Diabetes Paternal Uncle   . Crohn's disease Paternal Uncle   . Cancer Paternal Uncle     ? type  . Colitis Paternal Uncle   . Asthma Maternal Grandmother   . Hyperlipidemia Maternal Grandmother   . Heart disease Maternal Grandmother   . Stroke Maternal Grandmother   . Diabetes Maternal Grandfather   . Heart disease Maternal Grandfather   . Cancer Paternal Grandmother 69    female (?ovarian)  . Diabetes Paternal Grandmother   . Hypertension Paternal Grandmother   . Diabetes Paternal Grandfather   . Heart disease Paternal Grandfather   . Hypertension Paternal Grandfather   . Esophageal cancer Neg Hx   . Stomach cancer Neg Hx   . Stroke Maternal Aunt     Past Medical History  Diagnosis Date  . Seizure disorder     f/b Dr. Jannifer Franklin  . Asthma     related to allergies and colds  . Chronic bronchitis     gets bronchitis yearly with colds  . Sleep apnea     inconclusive test per pt (some central and obstructive component)  . Impaired fasting glucose   . Hernia (acquired) (recurrent)     R lower abdomen; has seen [facility] (Dr. Zena Amos and recurred  . Anal fissure   . Chronic kidney disease kidney stones  . Seizures last seisure june 2012  . Tinea cruris 7/09  . Internal hemorrhoids 03/2011  . Hiatal hernia 03/2011  . Reflux esophagitis 03/2011  . GERD (gastroesophageal reflux disease)   . Allergic rhinitis, cause unspecified   . Menstrual migraine     Dr, Jannifer Franklin  . Sleep apnea 06/07/09    mild complex sleep apnea, worse in supine position (uses CPAP intermittently only)  . Obesity   . Arthritis      inflammatory and psoriatic--Dr. Estanislado Pandy    Past Surgical History  Procedure Laterality Date  . Cesarean section      x2  . Cholecystectomy  2005  . Repair of incisional hernia  2005, 2008    related to laparoscopic cholecystectomy  .  Kidney stone surgery  08/2010    retrieved with basket (at [facility])  . Colonoscopy  03/19/11    Dr. Olevia Perches; internal hemorrhoids  . Esophagogastroduodenoscopy  03/19/11    hiatal hernia, esophagitis    Current Outpatient Prescriptions  Medication Sig Dispense Refill  . albuterol (PROVENTIL HFA;VENTOLIN HFA) 108 (90 BASE) MCG/ACT inhaler Inhale 2 puffs into the lungs as needed.        . fluconazole (DIFLUCAN) 150 MG tablet Take 1 tablet (150 mg total) by mouth once.  14 tablet  0  . folic acid (FOLVITE) 1 MG tablet Take 2 mg by mouth daily.       . Golimumab (SIMPONI [place]) Inject into the skin every 30 (thirty) days.        Marland Kitchen ibuprofen (ADVIL,MOTRIN) 200 MG tablet Take 400 mg by mouth daily.       . methotrexate (RHEUMATREX) 2.5 MG tablet Take 15 mg by mouth once a week. Caution:Chemotherapy. Protect from light.      . topiramate (TOPAMAX) 50 MG tablet One tablet in the morning and two tablets at night  270 tablet  1   No current facility-administered medications for this visit.    Allergies as of 12/08/2013 - Review Complete 12/08/2013  Allergen Reaction Noted  . Amoxicillin Hives 01/15/2011  . Codeine Itching and Other (See Comments) 01/15/2011  . Keppra [levetiracetam]  12/09/2012  . Ketorolac tromethamine Other (See Comments) 01/15/2011  . Sulfa antibiotics Nausea Only 01/15/2011  . Transderm-scop [scopolamine] Other (See Comments) 01/22/2011    Vitals: BP 123/79  Pulse 81  Resp 16  Ht 5' 3.75" (1.619 m)  Wt 297 lb (134.718 kg)  BMI 51.40 kg/m2  LMP 11/15/2013 Last Weight:  Wt Readings from Last 1 Encounters:  12/08/13 297 lb (134.718 kg)       Last Height:   Ht Readings from Last 1 Encounters:  12/08/13 5' 3.75" (1.619 m)     Physical exam:  General: The patient is awake, alert and appears not in acute distress. The patient is well groomed. Head: Normocephalic, atraumatic. Neck is supple. Mallampati 4 ,  neck circumference: 18  . Nasal airflow restricted , RR is 29 at rest ,  TMJ is not  evident . Retrognathia is not seen.  Cardiovascular:  Regular rate and rhythm , without  murmurs or carotid bruit, and without distended neck veins. Respiratory: Lungs are clear to auscultation. Skin:  Without evidence of edema, or rash Trunk: BMI is severely  elevated and patient  has normal posture.  Neurologic exam : The patient is awake and alert, oriented to place and time.   Memory subjective  described as intact. There is a normal attention span & concentration ability.  Speech is fluent without dysarthria, dysphonia or aphasia. Mood and affect are appropriate.  Cranial nerves: Pupils are equal and briskly reactive to light. Funduscopic exam without evidence of pallor or edema.  Extraocular movements  in vertical and horizontal planes intact and without nystagmus. Visual fields by finger perimetry are intact. Hearing to finger rub intact.  Facial sensation intact to fine touch. Facial motor strength is symmetric and tongue and uvula move midline.  Motor exam:   Normal tone ,muscle bulk and symmetric ,strength in all extremities.  Sensory:  Fine touch, pinprick and vibration were tested in all extremities. Proprioception is normal. Right hand sometimes numb, right leg will shake.   The patient was advised of the nature of the diagnosed sleep disorder , the treatment options  and risks for general a health and wellness ( CVA, CAD, pulmonary hypertension)  arising from not treating the condition. 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  Visit duration was 40 minutes.   Plan:  Treatment plan and additional workup :  Retitration on CPAP with CO2 for this patient is  with morning headaches and BMI over 40 , Needs CO2 for obesity hyperventilation.OHV Patient is medically non compliant and her her husband is more concerned.  Nocturia, snoring and witnessed apneas.  She jerks , talks and grunt in her sleep. She kicks. Her husband has not longer shared the bedroom after she stopped using CPAP.      Asencion Partridge Pinkney Venard MD  12/08/2013              Dr Jannifer Franklin note,  04-2013   MADDISYN HEGWOOD is an 48 y.o. female  History of present illness:  Ms. Mccaig is a 48 year old right-handed white female with a history of migraine headaches and a history of seizures. The patient has had blackouts in the past, but this has not been the case for almost a year. The patient is on Topamax, and the dose was increased to 50 mg in the morning and 100 mg in the evening. The patient is having some cognitive side effects on the medication. The patient is still working. The patient has a lot of joint pain associated with her psoriatic arthritis. The patient has sleep apnea, on CPAP, and she indicates that she oftentimes will wake up with the mask off of her face. The patient indicates that she has had several recent seizures within the last month. The seizures were associated with dizziness, dj vu sensations, and nausea. The patient may have a headache after the event. The patient does not lose consciousness or have true confusion during the events. The patient is operating a motor vehicle. The patient returns for an evaluation.    [facility] [address] [place], [place] [address]  Phone [phone] Fax [phone]

## 2013-12-14 ENCOUNTER — Ambulatory Visit: Payer: 59 | Admitting: Nurse Practitioner

## 2014-01-15 ENCOUNTER — Encounter: Payer: Self-pay | Admitting: Neurology

## 2014-01-19 ENCOUNTER — Ambulatory Visit (INDEPENDENT_AMBULATORY_CARE_PROVIDER_SITE_OTHER): Payer: 59 | Admitting: Neurology

## 2014-01-19 DIAGNOSIS — Z9114 Patient's other noncompliance with medication regimen: Secondary | ICD-10-CM

## 2014-01-19 DIAGNOSIS — G40909 Epilepsy, unspecified, not intractable, without status epilepticus: Secondary | ICD-10-CM

## 2014-01-19 DIAGNOSIS — G4733 Obstructive sleep apnea (adult) (pediatric): Secondary | ICD-10-CM

## 2014-01-19 DIAGNOSIS — E662 Morbid (severe) obesity with alveolar hypoventilation: Secondary | ICD-10-CM

## 2014-01-19 DIAGNOSIS — R569 Unspecified convulsions: Secondary | ICD-10-CM

## 2014-01-20 NOTE — Sleep Study (Signed)
Please see the scanned sleep study interpretation located in the Procedure tab within the Chart Review Section 

## 2014-01-24 ENCOUNTER — Encounter: Payer: Self-pay | Admitting: *Deleted

## 2014-01-24 ENCOUNTER — Other Ambulatory Visit: Payer: Self-pay | Admitting: Neurology

## 2014-01-24 ENCOUNTER — Encounter: Payer: Self-pay | Admitting: Neurology

## 2014-01-24 ENCOUNTER — Telehealth: Payer: Self-pay | Admitting: *Deleted

## 2014-01-24 DIAGNOSIS — G4733 Obstructive sleep apnea (adult) (pediatric): Secondary | ICD-10-CM

## 2014-01-24 NOTE — Telephone Encounter (Signed)
Patient was contacted and provided the results of her Split night sleep study.  Patient is currently using CPAP at home and was referred back to her current DME [facility].  Patient was mailed a copy of the test results and Dr. Floyde Parkins was routed the test results.   Patient instructed to contact our office 6-8 weeks post set up to schedule a follow up appointment.

## 2014-02-12 ENCOUNTER — Telehealth: Payer: Self-pay | Admitting: Neurology

## 2014-02-12 MED ORDER — TOPIRAMATE 50 MG PO TABS: ORAL_TABLET | ORAL | Status: DC

## 2014-02-12 NOTE — Telephone Encounter (Signed)
Rx has been sent to [facility] per patient request

## 2014-02-12 NOTE — Telephone Encounter (Signed)
Patient stated Rx refill for topiramate (TOPAMAX) 50 MG tablet will not be delivered until 5 days.  She only has 3 pills for tomorrow, requesting 15 tablets until she could receive her shipment.  Please forward to [facility].

## 2014-06-05 ENCOUNTER — Encounter: Payer: Self-pay | Admitting: Neurology

## 2014-06-05 ENCOUNTER — Ambulatory Visit (INDEPENDENT_AMBULATORY_CARE_PROVIDER_SITE_OTHER): Payer: 59 | Admitting: Neurology

## 2014-06-05 VITALS — BP 127/83 | HR 76 | Ht 63.0 in | Wt 300.4 lb

## 2014-06-05 DIAGNOSIS — G43009 Migraine without aura, not intractable, without status migrainosus: Secondary | ICD-10-CM

## 2014-06-05 DIAGNOSIS — G40209 Localization-related (focal) (partial) symptomatic epilepsy and epileptic syndromes with complex partial seizures, not intractable, without status epilepticus: Secondary | ICD-10-CM

## 2014-06-05 MED ORDER — TOPIRAMATE ER 50 MG PO CAP24: ORAL_CAPSULE | ORAL | Status: DC

## 2014-06-05 NOTE — Progress Notes (Signed)
Reason for visit: Seizures  Tiffany Wilkins is an 49 y.o. female  History of present illness:  Tiffany Wilkins is a 49 year old right-handed white female with a history of obesity, sleep apnea, and seizures. The patient indicates that she will have seizures on average once every 4 months. The seizures are associated with a dj vu sensation, and some alteration of consciousness, but the patient is able to hear what is going on around her. She clearly has an aura that will warn her that she is going to have a seizure. 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 She is working. She is trying to lose weight, dieting and exercising. Unfortunately, she sustained a stress fracture of the right foot recently, and she is having pain with ambulation. The patient has psoriatic arthritis as well. The patient is on her CPAP for her sleep apnea. She indicates that when she does have seizure it is usually because she has not slept well, possibly because she has taken off her CPAP at night. The patient has a history of migraine headaches, but she is doing well with this, she indicates that stress will worsen the headaches. The patient is on Topamax, but she is having cognitive side effects on the medication. She has been on a multitude of medications in the past that have not been tolerated or were not effective.  Past Medical History  Diagnosis Date  . Seizure disorder     f/b Dr. Jannifer Franklin  . Asthma     related to allergies and colds  . Chronic bronchitis     gets bronchitis yearly with colds  . Sleep apnea     inconclusive test per pt (some central and obstructive component)  . Impaired fasting glucose   . Hernia (acquired) (recurrent)     R lower abdomen; has seen [facility] (Dr. Zena Amos and recurred  . Anal fissure   . Chronic kidney disease kidney stones  . Seizures last seisure june 2012  . Tinea cruris 7/09  . Internal hemorrhoids 03/2011  . Hiatal hernia 03/2011  . Reflux  esophagitis 03/2011  . GERD (gastroesophageal reflux disease)   . Allergic rhinitis, cause unspecified   . Menstrual migraine     Dr, Jannifer Franklin  . Sleep apnea 06/07/09    mild complex sleep apnea, worse in supine position (uses CPAP intermittently only)  . Obesity   . Arthritis     inflammatory and psoriatic--Dr. Estanislado Pandy  . Fracture     stress fracture right foot    Past Surgical History  Procedure Laterality Date  . Cesarean section      x2  . Cholecystectomy  2005  . Repair of incisional hernia  2005, 2008    related to laparoscopic cholecystectomy  . Kidney stone surgery  08/2010    retrieved with basket (at [facility])  . Colonoscopy  03/19/11    Dr. Olevia Perches; internal hemorrhoids  . Esophagogastroduodenoscopy  03/19/11    hiatal hernia, esophagitis    Family History  Problem Relation Age of Onset  . Arthritis Mother     rheumatoid  . Hyperlipidemia Mother   . Hypertension Mother   . Hyperthyroidism Mother     s/p thyroidectomy, now with hypothyroidism  . Cancer Father 18    AML, lung cancer, kidney cancer (all presented at same time)  . Diabetes Father   . Hypertension Father   . Hyperlipidemia Father   . Psoriasis Father   . Anxiety disorder Father  OCD, nervous breakdown  . Psoriasis Sister   . Irritable bowel syndrome Sister   . Arthritis Sister     psoriatic  . Diabetes Sister   . GER disease Daughter   . Asthma Son   . Diabetes Paternal Uncle   . Crohn's disease Paternal Uncle   . Cancer Paternal Uncle     ? type  . Colitis Paternal Uncle   . Asthma Maternal Grandmother   . Hyperlipidemia Maternal Grandmother   . Heart disease Maternal Grandmother   . Stroke Maternal Grandmother   . Diabetes Maternal Grandfather   . Heart disease Maternal Grandfather   . Cancer Paternal Grandmother 77    female (?ovarian)  . Diabetes Paternal Grandmother   . Hypertension Paternal Grandmother   . Diabetes Paternal Grandfather   . Heart disease Paternal Grandfather    . Hypertension Paternal Grandfather   . Esophageal cancer Neg Hx   . Stomach cancer Neg Hx   . Stroke Maternal Aunt     Social history:  reports that she has never smoked. She has never used smokeless tobacco. She reports that she does not drink alcohol or use illicit drugs.    Allergies  Allergen Reactions  . Amoxicillin Hives  . Codeine Itching and Other (See Comments)    Heart races.  . Keppra [Levetiracetam]     Suicide ideation  . Ketorolac Tromethamine Other (See Comments)    Stopped breathing.  . Sulfa Antibiotics Nausea Only  . Transderm-Scop [Scopolamine] Other (See Comments)    Stopped breathing.    Medications:  Prior to Admission medications   Medication Sig Start Date End Date Taking? Authorizing Provider  albuterol (PROVENTIL HFA;VENTOLIN HFA) 108 (90 BASE) MCG/ACT inhaler Inhale 2 puffs into the lungs as needed.     Yes Historical Provider, MD  fluconazole (DIFLUCAN) 150 MG tablet Take 1 tablet (150 mg total) by mouth once. 08/30/12  Yes Camelia Eng Tysinger, PA-C  folic acid (FOLVITE) 1 MG tablet Take 2 mg by mouth daily.    Yes Historical Provider, MD  Golimumab (SIMPONI [place]) Inject into the skin every 30 (thirty) days.     Yes Historical Provider, MD  ibuprofen (ADVIL,MOTRIN) 200 MG tablet Take 400 mg by mouth every 6 (six) hours as needed.    Yes Historical Provider, MD  methotrexate (RHEUMATREX) 2.5 MG tablet Take 15 mg by mouth once a week. Caution:Chemotherapy. Protect from light.   Yes Historical Provider, MD  Naproxen Sodium 220 MG CAPS Take by mouth.   Yes Historical Provider, MD  topiramate (TOPAMAX) 50 MG tablet One tablet in the morning and two tablets at night 02/12/14  Yes Kathrynn Ducking, MD    ROS:  Out of a complete 14 system review of symptoms, the patient complains only of the following symptoms, and all other reviewed systems are negative.  Activity change Swollen abdomen Joint pain, joint swelling, neck pain Tremors of the right  knee Memory loss, headache  Blood pressure 127/83, pulse 76, height 5\' 3"  (1.6 m), weight 300 lb 6.4 oz (136.261 kg).  Physical Exam  General: The patient is alert and cooperative at the time of the examination. The patient is markedly obese.  Skin: 1+ edema at the ankles is noted bilaterally.   Neurologic Exam  Mental status: The patient is alert and oriented x 3 at the time of the examination. The patient has apparent normal recent and remote memory, with an apparently normal attention span and concentration ability.   Cranial  nerves: Facial symmetry is present. Speech is normal, no aphasia or dysarthria is noted. Extraocular movements are full. Visual fields are full.  Motor: The patient has good strength in all 4 extremities.  Sensory examination: Soft touch sensation is symmetric on the face, arms, and legs.  Coordination: The patient has good finger-nose-finger and heel-to-shin bilaterally.  Gait and station: The patient has a limping type gait on the right foot. Romberg is negative, no drift is seen.  Reflexes: Deep tendon reflexes are symmetric, but are depressed.   Assessment/Plan:  1. Partial seizures, intractable  2. Migraine headache, well controlled  3. Sleep apnea on CPAP  4. Obesity  5. Stress fracture of the right foot  The patient has had recent vitamin D levels that are in the low normal range. I have asked the patient go on vitamin D supplementation 1000 international units of vitamin D daily. The patient is not tolerating the Topamax well secondary to cognitive side effects. We will try to switch her to Trokendi at 150 mg at night. If this is not effective, the patient may be considered for switch to Lamictal. She will follow-up in about 6 months. Migraine headaches appear to be under good control currently.  Jill Alexanders MD 06/05/2014 8:20 PM  [facility] [address] [place], [place] [address]  Phone  [phone] Fax [phone]

## 2014-06-05 NOTE — Patient Instructions (Signed)

## 2014-06-08 ENCOUNTER — Other Ambulatory Visit: Payer: Self-pay | Admitting: Nurse Practitioner

## 2014-06-08 NOTE — Telephone Encounter (Signed)
Patient was changed to Topamax ER 150mg  on 03/22

## 2014-06-18 ENCOUNTER — Telehealth: Payer: Self-pay | Admitting: Neurology

## 2014-06-18 ENCOUNTER — Telehealth: Payer: Self-pay | Admitting: Internal Medicine

## 2014-06-18 ENCOUNTER — Other Ambulatory Visit: Payer: Self-pay | Admitting: *Deleted

## 2014-06-18 DIAGNOSIS — R1903 Right lower quadrant abdominal swelling, mass and lump: Secondary | ICD-10-CM

## 2014-06-18 MED ORDER — TOPIRAMATE ER 50 MG PO CAP24: ORAL_CAPSULE | ORAL | Status: DC

## 2014-06-18 NOTE — Telephone Encounter (Signed)
[facility] is calling requesting a refill for Topiramate ER 50 MG CP24. Please advise.

## 2014-06-18 NOTE — Telephone Encounter (Signed)
Pt called stating that she wants to get that MRI done from the ultrasound she had done back in September. She has spoke with other Doctors she goes to and they recommend her having the MRI also.  She is having pain when she excerises

## 2014-06-18 NOTE — Telephone Encounter (Signed)
Rx has been sent.  [facility] has confirmed receipt.

## 2014-06-18 NOTE — Telephone Encounter (Signed)
Scheduled for this Friday need to do prior auth.

## 2014-06-18 NOTE — Telephone Encounter (Signed)
Chart was reviewed--the radiologist suggested CT of the abdomen to further evaluate, not MRI.   Okay for CT scan

## 2014-06-19 ENCOUNTER — Telehealth: Payer: Self-pay | Admitting: Neurology

## 2014-06-19 NOTE — Telephone Encounter (Signed)
I contacted [facility], provided clinical info.  Request under review.  Ref Key: [id number]

## 2014-06-19 NOTE — Telephone Encounter (Signed)
Tiffany Wilkins with [facility] mail order pharmacy @ [phone], stated PA needed for Rx Topiramate ER 50 MG CP24.  Please call and advise.

## 2014-06-20 ENCOUNTER — Other Ambulatory Visit: Payer: Self-pay | Admitting: *Deleted

## 2014-06-20 MED ORDER — ERGOCALCIFEROL 1.25 MG (50000 UT) PO CAPS: 50000.0000 [IU] | ORAL_CAPSULE | ORAL | Status: DC

## 2014-06-21 NOTE — Telephone Encounter (Signed)
I spoke with Jan at [facility].  She said no prior Josem Kaufmann is required, and med was shipped today.  She is unsure why we were called.

## 2014-06-22 ENCOUNTER — Other Ambulatory Visit: Payer: Self-pay

## 2014-06-26 ENCOUNTER — Encounter (HOSPITAL_COMMUNITY): Payer: Self-pay | Admitting: Certified Registered Nurse Anesthetist

## 2014-06-26 ENCOUNTER — Ambulatory Visit (HOSPITAL_COMMUNITY)
Admission: RE | Admit: 2014-06-26 | Discharge: 2014-06-26 | Disposition: A | Discharge status: Home / Self Care | Payer: 59 | Source: Ambulatory Visit | Attending: Family Medicine | Admitting: Family Medicine

## 2014-06-26 ENCOUNTER — Telehealth: Payer: Self-pay

## 2014-06-26 ENCOUNTER — Ambulatory Visit
Admission: RE | Admit: 2014-06-26 | Discharge: 2014-06-26 | Disposition: A | Discharge status: Home / Self Care | Payer: 59 | Source: Ambulatory Visit | Attending: Family Medicine | Admitting: Family Medicine

## 2014-06-26 DIAGNOSIS — R1909 Other intra-abdominal and pelvic swelling, mass and lump: Secondary | ICD-10-CM | POA: Insufficient documentation

## 2014-06-26 DIAGNOSIS — D259 Leiomyoma of uterus, unspecified: Secondary | ICD-10-CM | POA: Diagnosis not present

## 2014-06-26 DIAGNOSIS — Z9049 Acquired absence of other specified parts of digestive tract: Secondary | ICD-10-CM | POA: Insufficient documentation

## 2014-06-26 DIAGNOSIS — R160 Hepatomegaly, not elsewhere classified: Secondary | ICD-10-CM | POA: Insufficient documentation

## 2014-06-26 DIAGNOSIS — R1903 Right lower quadrant abdominal swelling, mass and lump: Secondary | ICD-10-CM

## 2014-06-26 DIAGNOSIS — K760 Fatty (change of) liver, not elsewhere classified: Secondary | ICD-10-CM | POA: Insufficient documentation

## 2014-06-26 DIAGNOSIS — I517 Cardiomegaly: Secondary | ICD-10-CM | POA: Insufficient documentation

## 2014-06-26 DIAGNOSIS — Z87442 Personal history of urinary calculi: Secondary | ICD-10-CM | POA: Insufficient documentation

## 2014-06-26 DIAGNOSIS — K449 Diaphragmatic hernia without obstruction or gangrene: Secondary | ICD-10-CM | POA: Insufficient documentation

## 2014-06-26 MED ORDER — IOHEXOL 300 MG/ML  SOLN: 100.0000 mL | Freq: Once | INTRAMUSCULAR | Status: AC | PRN

## 2014-06-26 NOTE — Telephone Encounter (Signed)
I received a call from [facility] that the patient had just made them aware of one that she has seizers and that she has to have an ultra sound for IV . The Dr.there told them he did not feel comfortable doing the CT there so they called me and asked if I could get her in at one of the hospitals today to have it done because she had already drank the contrast after 30 min on the phone she was told to go straight to [facility] to have the CT done and I was going to call her ins.to have preauth. Changed but in notes it said none was needed.Just wanted to let you know.

## 2014-06-27 ENCOUNTER — Encounter: Payer: Self-pay | Admitting: Family Medicine

## 2014-06-27 ENCOUNTER — Ambulatory Visit (INDEPENDENT_AMBULATORY_CARE_PROVIDER_SITE_OTHER): Payer: 59 | Admitting: Family Medicine

## 2014-06-27 VITALS — BP 116/72 | HR 80 | Ht 63.0 in | Wt 304.4 lb

## 2014-06-27 DIAGNOSIS — R935 Abnormal findings on diagnostic imaging of other abdominal regions, including retroperitoneum: Secondary | ICD-10-CM

## 2014-06-27 DIAGNOSIS — D259 Leiomyoma of uterus, unspecified: Secondary | ICD-10-CM | POA: Diagnosis not present

## 2014-06-27 DIAGNOSIS — Z6841 Body Mass Index (BMI) 40.0 and over, adult: Secondary | ICD-10-CM

## 2014-06-27 DIAGNOSIS — K760 Fatty (change of) liver, not elsewhere classified: Secondary | ICD-10-CM | POA: Diagnosis not present

## 2014-06-27 DIAGNOSIS — R1031 Right lower quadrant pain: Secondary | ICD-10-CM

## 2014-06-27 DIAGNOSIS — G8929 Other chronic pain: Secondary | ICD-10-CM

## 2014-06-27 NOTE — Progress Notes (Signed)
Chief Complaint  Patient presents with  . Results    CT results. On med list-new rx for topamax ER not yet started-currently taking plain topamax 50mg  1qam and 2pm.    Patient presents to discuss her CT scan of her abdomen.  She has been having pain with standing for long times and with exercise.  Pain is along the right lower abdomen.  It seems to get bigger and harder when it is painful.  Tried Spanx, girdles, back braces--feels like it "mashes it flat", rather than holding it up.  She joined a gym, started walking on the treadmill, but it causes pain.  She was also on a cruise and was walking a lot, she was having more pain. Exercise was then limited due to stress fracture in her foot--improving, out of boot now.  She isn't having nausea/vomiting/bowel changes.  She has gained 8 pounds since her last visit here in September. She skips meals, eats lots of cheese, nuts, chocolate milk, candy, ranch dressing, and drinks regular soda at work. (husband contributes to this dietary history).  Periods are lasting longer (6 days instead of 4); no pelvic pain.  Cramping is a little worse than in the past, but tolerable.  CT noted uterine fibroid, which had increased in size from the ultrasound in September.  See results below.  PMH, PSH, SH reviewed Med list updated  ROS:  No fevers, chills, URI symptoms, GU complaints except as per HPI.  Foot pain from fracture is improving.  +RLQ pain as per HPI. No other GI complaints.  PHYSICAL EXAM: BP 116/72 mmHg  Pulse 80  Ht 5\' 3"  (1.6 m)  Wt 304 lb 6.4 oz (138.075 kg)  BMI 53.94 kg/m2  LMP 06/12/2014 Morbidly obese, pleasant female in no distress Abdomen: when supine, only slight asymmetry is noted.  Normal bowel sounds.  No organomegaly.  When standing, there is a much clearer asymmetry, appears more like a mass/bulging in the RLQ, which is firm.  Nontender.  CT scan: IMPRESSION: 1. Progression of right-sided abdominal pelvic wall  laxity, containing nonobstructive large and small bowel. 2. Hepatomegaly and probable mild hepatic steatosis. 3. Enlargement of a posterior uterine body/fundal fibroid.  ASSESSMENT/PLAN:  Abdominal pain, chronic, right lower quadrant - refer to surgery for eval/treatment.  ?conservative management such as abdominal binder vs need for surgical repair  Abnormal CT of the abdomen  Uterine leiomyoma, unspecified location - minimally symptomatic.  refer to GYN (Dr. Garwin Brothers) for consult, especially given increasing size  BMI 50.0-59.9, adult - counseled extensively regarding diet and weight loss  Hepatic steatosis - risks reviewed, including potential to advance to cirrhosis.  strongly encouraged weight loss  30 min visit, more than 1/2 spent counseling re: diet and weight loss, risks of fatty liver, s/sx of hernia, referrals.

## 2014-06-27 NOTE — Patient Instructions (Signed)
We are referring you to the gynecologist to discuss the fibroid on the uterus, and to the general surgeon to discuss possible options regarding the pain and swelling in you right lower abdomen.  Please take into consideration some of the dietary options we discussed--don't skip meals.  Eat more fruits and vegetables, cut back on carbs.  Smaller portions; no regular sodas.  Drink more water--at least 8 glasses per day.

## 2014-07-20 ENCOUNTER — Ambulatory Visit (INDEPENDENT_AMBULATORY_CARE_PROVIDER_SITE_OTHER): Payer: 59 | Admitting: Family Medicine

## 2014-07-20 ENCOUNTER — Encounter: Payer: Self-pay | Admitting: Family Medicine

## 2014-07-20 VITALS — BP 118/82 | HR 75 | Temp 97.9°F | Wt 306.0 lb

## 2014-07-20 DIAGNOSIS — L4050 Arthropathic psoriasis, unspecified: Secondary | ICD-10-CM | POA: Diagnosis not present

## 2014-07-20 DIAGNOSIS — R109 Unspecified abdominal pain: Secondary | ICD-10-CM | POA: Diagnosis not present

## 2014-07-20 LAB — POCT URINALYSIS DIPSTICK
Bilirubin, UA: NEGATIVE
Blood, UA: NEGATIVE
GLUCOSE UA: NEGATIVE
Ketones, UA: NEGATIVE
Leukocytes, UA: NEGATIVE
Nitrite, UA: NEGATIVE
Protein, UA: NEGATIVE
Spec Grav, UA: >=1.03
Urobilinogen, UA: NEGATIVE
pH, UA: 6

## 2014-07-20 MED ORDER — TRAMADOL HCL 50 MG PO TABS: 50.0000 mg | ORAL_TABLET | Freq: Four times a day (QID) | ORAL | Status: DC | PRN

## 2014-07-20 NOTE — Patient Instructions (Signed)
You can take 4 ibuprofen 3 times per day and I will give you some tramadol if you need extra pain. If that doesn't help guide go to the ER Drink plenty of fluids

## 2014-07-20 NOTE — Progress Notes (Signed)
   Subjective:    Patient ID: Tiffany Wilkins, female    DOB: 02/05/1966, 49 y.o.   MRN: [id number]  HPI She complains of a two-day history of right CVA pain. The pain is relatively constant but does occasionally cause a sharp stabbing type pain. Motion has no effect. She does complain of slight urinary urgency. She has a previous history of renal stones and states the pain is  very similar.She also has a history of psoriatic arthritis and is on multiple DMARD's   Review of Systems     Objective:   Physical Exam Alert and complaining of right flank pain. No pain on motion. No tenderness to palpation of flank area or abdominal area. Urine microscopic was negative.      Assessment & Plan:  Right flank pain  Psoriatic arthritis Her symptoms are most consistent with a possible stone. I will encourage her to drink plenty of fluids, use ibuprofen 800 3 times a day for pain relief and will give her tramadol for worsening pain if this does not work, she was instructed to go to the ER.

## 2014-09-13 ENCOUNTER — Other Ambulatory Visit: Payer: Self-pay | Admitting: Neurology

## 2014-09-24 ENCOUNTER — Telehealth: Payer: Self-pay | Admitting: Neurology

## 2014-09-24 NOTE — Telephone Encounter (Signed)
Pt called and needs pre-op clearance and wants to know if she needs to come in for an appt or if she can just get the clearance , she was last seen in March. Please call and advise. [phone].  Fax number to [facility] in [place] is [phone].

## 2014-09-24 NOTE — Telephone Encounter (Signed)
I called Alisha. The patient is having a ventral hernia repair (date not set). Lars Mage stated that according to her documentation, the patient has seizures from general anesthesia. Appointment made for 7/26 at 0800. I told her that if that day/time did not work well for the patient to have the patient call to r/s with me.

## 2014-09-24 NOTE — Telephone Encounter (Signed)
Alisha from [facility] called and LM on VM that she was calling to schedule appt for Tiffany Wilkins with Dr Jannifer Franklin for a pre-Op eval for surgery Please call [phone] or [phone] dg

## 2014-09-25 NOTE — Telephone Encounter (Signed)
I called the patient and explained that Dr. Jannifer Franklin would like to see her for a revisit before her surgery. She stated the date and time (7/26 at 0800) was fine. She also requested I cancel her September appointment, which I did.

## 2014-10-09 ENCOUNTER — Encounter: Payer: Self-pay | Admitting: Neurology

## 2014-10-09 ENCOUNTER — Ambulatory Visit (INDEPENDENT_AMBULATORY_CARE_PROVIDER_SITE_OTHER): Payer: 59 | Admitting: Neurology

## 2014-10-09 VITALS — BP 134/85 | HR 67 | Ht 62.0 in | Wt 298.2 lb

## 2014-10-09 DIAGNOSIS — G40209 Localization-related (focal) (partial) symptomatic epilepsy and epileptic syndromes with complex partial seizures, not intractable, without status epilepticus: Secondary | ICD-10-CM | POA: Diagnosis not present

## 2014-10-09 NOTE — Progress Notes (Signed)
Reason for visit: Seizures  Tiffany Wilkins is an 49 y.o. female  History of present illness:  [name] is a 49 year old right-handed female with a history of morbid obesity and epilepsy. The patient has done relatively well with her seizure control, and she has been converted to long-acting topiramate with good tolerance. She has an abdominal hernia, and she has been told that this will require surgery. The patient weighs 310 pounds, and she has to lose weight prior to the surgery. The patient is on a diet, and she has begun an exercise program, she has already lost 12 pounds. She returns to this office for further evaluation. No other significant medical issues have come up since last seen. The patient is pleased with her progress in regards to the weight loss.  Past Medical History  Diagnosis Date  . Seizure disorder     f/b Dr. Jannifer Franklin  . Asthma     related to allergies and colds  . Chronic bronchitis     gets bronchitis yearly with colds  . Sleep apnea     inconclusive test per pt (some central and obstructive component)  . Impaired fasting glucose   . Hernia (acquired) (recurrent)     R lower abdomen; has seen [facility] (Dr. Zena Amos and recurred  . Anal fissure   . Chronic kidney disease kidney stones  . Seizures last seisure june 2012  . Tinea cruris 7/09  . Internal hemorrhoids 03/2011  . Hiatal hernia 03/2011  . Reflux esophagitis 03/2011  . GERD (gastroesophageal reflux disease)   . Allergic rhinitis, cause unspecified   . Menstrual migraine     Dr, Jannifer Franklin  . Sleep apnea 06/07/09    mild complex sleep apnea, worse in supine position (uses CPAP intermittently only)  . Obesity   . Arthritis     inflammatory and psoriatic--Dr. Estanislado Pandy  . Fracture     stress fracture right foot    Past Surgical History  Procedure Laterality Date  . Cesarean section      x2  . Cholecystectomy  2005  . Repair of incisional hernia  2005, 2008    related to laparoscopic  cholecystectomy  . Kidney stone surgery  08/2010    retrieved with basket (at [facility])  . Colonoscopy  03/19/11    Dr. Olevia Perches; internal hemorrhoids  . Esophagogastroduodenoscopy  03/19/11    hiatal hernia, esophagitis    Family History  Problem Relation Age of Onset  . Arthritis Mother     rheumatoid  . Hyperlipidemia Mother   . Hypertension Mother   . Hyperthyroidism Mother     s/p thyroidectomy, now with hypothyroidism  . Cancer Father 55    AML, lung cancer, kidney cancer (all presented at same time)  . Diabetes Father   . Hypertension Father   . Hyperlipidemia Father   . Psoriasis Father   . Anxiety disorder Father     OCD, nervous breakdown  . Psoriasis Sister   . Irritable bowel syndrome Sister   . Arthritis Sister     psoriatic  . Diabetes Sister   . GER disease Daughter   . Asthma Son   . Diabetes Paternal Uncle   . Crohn's disease Paternal Uncle   . Cancer Paternal Uncle     ? type  . Colitis Paternal Uncle   . Asthma Maternal Grandmother   . Hyperlipidemia Maternal Grandmother   . Heart disease Maternal Grandmother   . Stroke Maternal Grandmother   .  Diabetes Maternal Grandfather   . Heart disease Maternal Grandfather   . Cancer Paternal Grandmother 46    female (?ovarian)  . Diabetes Paternal Grandmother   . Hypertension Paternal Grandmother   . Diabetes Paternal Grandfather   . Heart disease Paternal Grandfather   . Hypertension Paternal Grandfather   . Esophageal cancer Neg Hx   . Stomach cancer Neg Hx   . Stroke Maternal Aunt     Social history:  reports that she has never smoked. She has never used smokeless tobacco. She reports that she drinks alcohol. She reports that she does not use illicit drugs.    Allergies  Allergen Reactions  . Amoxicillin Hives  . Codeine Itching and Other (See Comments)    Heart races.  . Keppra [Levetiracetam]     Suicide ideation  . Ketorolac Tromethamine Other (See Comments)    Stopped breathing.  . Sulfa  Antibiotics Nausea Only  . Transderm-Scop [Scopolamine] Other (See Comments)    Stopped breathing.    Medications:  Prior to Admission medications   Medication Sig Start Date End Date Taking? Authorizing Provider  albuterol (PROVENTIL HFA;VENTOLIN HFA) 108 (90 BASE) MCG/ACT inhaler Inhale 2 puffs into the lungs as needed.     Yes Historical Provider, MD  fluconazole (DIFLUCAN) 150 MG tablet Take 1 tablet (150 mg total) by mouth once. 08/30/12  Yes Camelia Eng Tysinger, PA-C  folic acid (FOLVITE) 1 MG tablet Take 2 mg by mouth daily.    Yes Historical Provider, MD  Golimumab (SIMPONI [place]) Inject into the skin every 30 (thirty) days.     Yes Historical Provider, MD  ibuprofen (ADVIL,MOTRIN) 200 MG tablet Take 400 mg by mouth every 6 (six) hours as needed.    Yes Historical Provider, MD  methotrexate (RHEUMATREX) 2.5 MG tablet Take 15 mg by mouth once a week. Caution:Chemotherapy. Protect from light.   Yes Historical Provider, MD  Multiple Minerals-Vitamins (CALCIUM-MAGNESIUM-ZINC-D3) TABS Take 2 tablets by mouth daily.   Yes Historical Provider, MD  Naproxen Sodium 220 MG CAPS Take by mouth.   Yes Historical Provider, MD  traMADol (ULTRAM) 50 MG tablet Take 1 tablet (50 mg total) by mouth every 6 (six) hours as needed. 07/20/14  Yes Denita Lung, MD  TROKENDI XR 50 MG CP24 TAKE 3 CAPSULES AT NIGHT 09/13/14  Yes Kathrynn Ducking, MD    ROS:  Out of a complete 14 system review of symptoms, the patient complains only of the following symptoms, and all other reviewed systems are negative.  Increased activity Leg swelling Joint swelling Memory loss  Blood pressure 134/85, pulse 67, height 5\' 2"  (1.575 m), weight 298 lb 3.2 oz (135.263 kg).  Physical Exam  General: The patient is alert and cooperative at the time of the examination. The patient is morbidly obese.  Skin: 2+ edema is seen below the knees bilaterally, right greater than left.   Neurologic Exam  Mental status: The patient is  alert and oriented x 3 at the time of the examination. The patient has apparent normal recent and remote memory, with an apparently normal attention span and concentration ability.   Cranial nerves: Facial symmetry is present. Speech is normal, no aphasia or dysarthria is noted. Extraocular movements are full. Visual fields are full.  Motor: The patient has good strength in all 4 extremities.  Sensory examination: Soft touch sensation is symmetric on the face, arms, and legs.  Coordination: The patient has good finger-nose-finger and heel-to-shin bilaterally.  Gait and station:  The patient has a normal gait. Tandem gait is normal. Romberg is negative. No drift is seen.  Reflexes: Deep tendon reflexes are symmetric.   Assessment/Plan:  1. Partial complex seizure disorder  2. Morbid obesity  The patient is considering surgery for an abdominal hernia in the near future. In the past, she has had seizures following general anesthesia. The patient will continue the topiramate at the current dose. I would recommend a 1 g IV load of Dilantin prior to surgery. The Topamax should be restarted as soon as possible after the surgery when she can take oral medication. The patient will follow-up in 6 months. From a neurologic standpoint, the patient is cleared for surgery.  Jill Alexanders MD 10/09/2014 10:33 AM  [facility] [address] [place], [place] [address]  Phone [phone] Fax [phone]

## 2014-10-09 NOTE — Patient Instructions (Signed)

## 2014-12-04 ENCOUNTER — Ambulatory Visit: Payer: 59 | Admitting: Neurology

## 2014-12-11 ENCOUNTER — Other Ambulatory Visit: Payer: Self-pay | Admitting: Neurology

## 2015-04-11 ENCOUNTER — Ambulatory Visit (INDEPENDENT_AMBULATORY_CARE_PROVIDER_SITE_OTHER): Payer: 59 | Admitting: Neurology

## 2015-04-11 ENCOUNTER — Encounter: Payer: Self-pay | Admitting: Neurology

## 2015-04-11 VITALS — BP 127/72 | HR 74 | Ht 62.0 in | Wt 301.0 lb

## 2015-04-11 DIAGNOSIS — G40209 Localization-related (focal) (partial) symptomatic epilepsy and epileptic syndromes with complex partial seizures, not intractable, without status epilepticus: Secondary | ICD-10-CM | POA: Diagnosis not present

## 2015-04-11 DIAGNOSIS — G43009 Migraine without aura, not intractable, without status migrainosus: Secondary | ICD-10-CM

## 2015-04-11 NOTE — Progress Notes (Signed)
Reason for visit: Seizures  Tiffany Wilkins is an 50 y.o. female  History of present illness:  Tiffany Wilkins is a 50 year old right-handed white female with a history of significant obesity, and history of partial complex seizures. She has done well since last seen without any seizure recurrence. The patient is on Trokendi and is tolerating medication fairly well. She still has some cognitive issues with word finding and memory. 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 She was being considered for bariatric surgery, but this never took place. She has gained weight in the interim period. The patient returns to this office for an evaluation. She suffers from hip and knee arthritis and psoriatic arthritis involving the hands. The patient has a history of migraine headaches, but she does quite well with this, she has only an occasional headache at this point.  Past Medical History  Diagnosis Date  . Seizure disorder ([place])     f/b Dr. Jannifer Franklin  . Asthma     related to allergies and colds  . Chronic bronchitis     gets bronchitis yearly with colds  . Sleep apnea     inconclusive test per pt (some central and obstructive component)  . Impaired fasting glucose   . Hernia (acquired) (recurrent)     R lower abdomen; has seen [facility] (Dr. Zena Amos and recurred  . Anal fissure   . Chronic kidney disease kidney stones  . Seizures ([place]) last seisure june 2012  . Tinea cruris 7/09  . Internal hemorrhoids 03/2011  . Hiatal hernia 03/2011  . Reflux esophagitis 03/2011  . GERD (gastroesophageal reflux disease)   . Allergic rhinitis, cause unspecified   . Menstrual migraine     Dr, Jannifer Franklin  . Sleep apnea 06/07/09    mild complex sleep apnea, worse in supine position (uses CPAP intermittently only)  . Obesity   . Arthritis     inflammatory and psoriatic--Dr. Estanislado Pandy  . Fracture     stress fracture right foot    Past Surgical History  Procedure Laterality Date  . Cesarean section       x2  . Cholecystectomy  2005  . Repair of incisional hernia  2005, 2008    related to laparoscopic cholecystectomy  . Kidney stone surgery  08/2010    retrieved with basket (at [facility])  . Colonoscopy  03/19/11    Dr. Olevia Perches; internal hemorrhoids  . Esophagogastroduodenoscopy  03/19/11    hiatal hernia, esophagitis    Family History  Problem Relation Age of Onset  . Arthritis Mother     rheumatoid  . Hyperlipidemia Mother   . Hypertension Mother   . Hyperthyroidism Mother     s/p thyroidectomy, now with hypothyroidism  . Cancer Father 50    AML, lung cancer, kidney cancer (all presented at same time)  . Diabetes Father   . Hypertension Father   . Hyperlipidemia Father   . Psoriasis Father   . Anxiety disorder Father     OCD, nervous breakdown  . Psoriasis Sister   . Irritable bowel syndrome Sister   . Arthritis Sister     psoriatic  . Diabetes Sister   . GER disease Daughter   . Asthma Son   . Diabetes Paternal Uncle   . Crohn's disease Paternal Uncle   . Cancer Paternal Uncle     ? type  . Colitis Paternal Uncle   . Asthma Maternal Grandmother   . Hyperlipidemia Maternal Grandmother   .  Heart disease Maternal Grandmother   . Stroke Maternal Grandmother   . Diabetes Maternal Grandfather   . Heart disease Maternal Grandfather   . Cancer Paternal Grandmother 49    female (?ovarian)  . Diabetes Paternal Grandmother   . Hypertension Paternal Grandmother   . Diabetes Paternal Grandfather   . Heart disease Paternal Grandfather   . Hypertension Paternal Grandfather   . Esophageal cancer Neg Hx   . Stomach cancer Neg Hx   . Stroke Maternal Aunt   . Stroke Cousin     Social history:  reports that she has never smoked. She has never used smokeless tobacco. She reports that she does not drink alcohol or use illicit drugs.    Allergies  Allergen Reactions  . Amoxicillin Hives  . Codeine Itching and Other (See Comments)    Heart races.  . Keppra [Levetiracetam]      Suicide ideation  . Ketorolac Tromethamine Other (See Comments)    Stopped breathing.  . Sulfa Antibiotics Nausea Only  . Transderm-Scop [Scopolamine] Other (See Comments)    Stopped breathing.    Medications:  Prior to Admission medications   Medication Sig Start Date End Date Taking? Authorizing Provider  albuterol (PROVENTIL HFA;VENTOLIN HFA) 108 (90 BASE) MCG/ACT inhaler Inhale 2 puffs into the lungs as needed.     Yes Historical Provider, MD  fluconazole (DIFLUCAN) 150 MG tablet Take 1 tablet (150 mg total) by mouth once. 08/30/12  Yes Camelia Eng Tysinger, PA-C  folic acid (FOLVITE) 1 MG tablet Take 2 mg by mouth daily.    Yes Historical Provider, MD  Golimumab (SIMPONI [place]) Inject into the skin every 30 (thirty) days.     Yes Historical Provider, MD  ibuprofen (ADVIL,MOTRIN) 200 MG tablet Take 400 mg by mouth every 6 (six) hours as needed.    Yes Historical Provider, MD  methotrexate (RHEUMATREX) 2.5 MG tablet Take 15 mg by mouth once a week. Caution:Chemotherapy. Protect from light.   Yes Historical Provider, MD  Multiple Minerals-Vitamins (CALCIUM-MAGNESIUM-ZINC-D3) TABS Take 2 tablets by mouth daily.   Yes Historical Provider, MD  Naproxen Sodium 220 MG CAPS Take by mouth.   Yes Historical Provider, MD  traMADol (ULTRAM) 50 MG tablet Take 1 tablet (50 mg total) by mouth every 6 (six) hours as needed. 07/20/14  Yes Denita Lung, MD  TROKENDI XR 50 MG CP24 TAKE 3 CAPSULES AT NIGHT 12/11/14  Yes Kathrynn Ducking, MD    ROS:  Out of a complete 14 system review of symptoms, the patient complains only of the following symptoms, and all other reviewed systems are negative.  Fatigue Blurred vision Leg swelling Joint pain, joint swelling, walking difficulty Skin rash Memory loss  Blood pressure 127/72, pulse 74, height 5\' 2"  (1.575 m), weight 301 lb (136.533 kg).  Physical Exam  General: The patient is alert and cooperative at the time of the examination. The patient is markedly  obese.  Skin: No significant peripheral edema is noted.   Neurologic Exam  Mental status: The patient is alert and oriented x 3 at the time of the examination. The patient has apparent normal recent and remote memory, with an apparently normal attention span and concentration ability.   Cranial nerves: Facial symmetry is present. Speech is normal, no aphasia or dysarthria is noted. Extraocular movements are full. Visual fields are full.  Motor: The patient has good strength in all 4 extremities.  Sensory examination: Soft touch sensation is symmetric on the face, arms, and legs.  Coordination: The patient has good finger-nose-finger and heel-to-shin bilaterally.  Gait and station: The patient has a normal gait. Tandem gait is slightly unsteady. Romberg is negative. No drift is seen.  Reflexes: Deep tendon reflexes are symmetric, but are depressed.   Assessment/Plan:  1. Morbid obesity  2. Migraine headache, well controlled  3. History of seizures  The patient is doing well with the seizures currently and well with her headaches. She will continue the current dose of Trokendi. She will follow-up in 6 months, sooner if needed. I have emphasized the need for a daily weight loss regimen, and she needs to get back into exercise with swimming.  Jill Alexanders MD 04/11/2015 7:52 PM  [facility] [address] [place], [place] [address]  Phone [phone] Fax [phone]

## 2015-04-11 NOTE — Patient Instructions (Signed)

## 2015-07-04 ENCOUNTER — Telehealth: Payer: Self-pay | Admitting: Neurology

## 2015-07-04 MED ORDER — TOPIRAMATE ER 50 MG PO CAP24: ORAL_CAPSULE | ORAL | Status: DC

## 2015-07-04 NOTE — Telephone Encounter (Signed)
Refills sent to mail order pharmacy as requested. Called and left VM mssg to notify pt. May call back w/ any additional concerns.

## 2015-07-04 NOTE — Telephone Encounter (Addendum)
Pt called said she requested refill for TROKENDI XR 50 MG CP24 RX # [id number] day supply from [place] yesterday 07/03/15 to be overnighted. She received an email today (07/04/15) that said it "was waiting for provider review. We have reached out but have not heard back from physician". Pt thought she had another box of medication but she is out. She took last pill yesterday. Please call her

## 2015-07-05 NOTE — Telephone Encounter (Signed)
I called the patient to find out if she needed a short-term prescription for her topiramate. I left a message. She is to call if she does. We will need to get a prior authorization for the Trokendi. The patient is on this medication because she did not tolerate the short acting Topamax.

## 2015-07-05 NOTE — Telephone Encounter (Signed)
[facility] [phone] ref# [id number] called said trokendi will need PA but if provider wants to substitute, the plan will cover topiramate.

## 2015-07-08 NOTE — Telephone Encounter (Signed)
[name] states it is urgent that they get a verbal Rx trokendi XR 50 mg CP24 [id number] day supply as they cannot hold medication.  Please call [phone].

## 2015-07-08 NOTE — Telephone Encounter (Signed)
Pt called said trokendi needs PA  (p) [phone] opt 2 opt 1 opt 0, can accept verbal order. Pt has been out of medication since last Wednesday. She said VM left said she could get RX for keppra but she doesn't take Keppra. She could take topiriamate until she gets the trokendi, please send to [facility] on [address] and [address].

## 2015-07-09 ENCOUNTER — Encounter: Payer: Self-pay | Admitting: *Deleted

## 2015-07-09 ENCOUNTER — Telehealth: Payer: Self-pay | Admitting: Neurology

## 2015-07-09 MED ORDER — TOPIRAMATE ER 50 MG PO CAP24
150.0000 mg | ORAL_CAPSULE | Freq: Every day | ORAL | Status: DC
Start: 2015-07-09 — End: 2015-10-10

## 2015-07-09 NOTE — Telephone Encounter (Addendum)
Case [id number] Name:ST: Qudexy XR capsule, Topamax tablet, Topamax Sprinkle Cap, Trokendi XR capsule, Topiramate ER capsule - [facility]SI GF; PA Status:Approved;Coverage Start Date:06/09/2015;Coverage End Date:07/08/2016  Returned pt TC and let her know that PA was approved. [facility] stated ship date as 07/10/15. Pt requests short fill of Topiramate to use until she receives Trokendi mail order shipment. Topiramate rx retailed x 1 wk as requested.

## 2015-07-09 NOTE — Telephone Encounter (Signed)
Returned [facility] [employer] to [facility]. Will fill 50 mg tabs (instead of caps) d/t pharmacy availability and insurance coverage x 1 week.

## 2015-07-09 NOTE — Telephone Encounter (Signed)
Patient is calling and states she has been without her medication for 7 days. Please send Rx topiriamate to [facility] on [address] until she receives her trokendi.

## 2015-07-09 NOTE — Addendum Note (Signed)
Addended by: Monte Fantasia on: 07/09/2015 03:03 PM   Modules accepted: Orders

## 2015-07-09 NOTE — Telephone Encounter (Signed)
[facility] [phone] called regarding Topiramate ER 50 MG CP24, not covered by insurance and can't break open packet of it, can this be changed to tablets?

## 2015-10-10 ENCOUNTER — Encounter: Payer: Self-pay | Admitting: Neurology

## 2015-10-10 ENCOUNTER — Ambulatory Visit (INDEPENDENT_AMBULATORY_CARE_PROVIDER_SITE_OTHER): Payer: 59 | Admitting: Neurology

## 2015-10-10 VITALS — BP 150/94 | HR 72 | Ht 62.0 in | Wt 302.5 lb

## 2015-10-10 DIAGNOSIS — G40209 Localization-related (focal) (partial) symptomatic epilepsy and epileptic syndromes with complex partial seizures, not intractable, without status epilepticus: Secondary | ICD-10-CM | POA: Diagnosis not present

## 2015-10-10 DIAGNOSIS — G43009 Migraine without aura, not intractable, without status migrainosus: Secondary | ICD-10-CM | POA: Diagnosis not present

## 2015-10-10 NOTE — Progress Notes (Signed)
Reason for visit: Seizures  Tiffany Wilkins is an 50 y.o. female  History of present illness:  [name] is a 50 year old right-handed white female with a history of psoriatic arthritis, morbid obesity, migraine headaches, and seizures. 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 She has done well however with her seizures, she has had no recurrence since last seen, she operates a motor vehicle without difficulty. The patient has had improvement in her migraine headaches, she may have only one or 2 headaches a six-month period of time. She is on Trokendi, she tolerates the medication well. She continues to work. The patient returns for an evaluation.  Past Medical History:  Diagnosis Date  . Allergic rhinitis, cause unspecified   . Anal fissure   . Arthritis    inflammatory and psoriatic--Dr. Estanislado Pandy  . Asthma    related to allergies and colds  . Chronic bronchitis    gets bronchitis yearly with colds  . Chronic kidney disease kidney stones  . Fracture    stress fracture right foot  . GERD (gastroesophageal reflux disease)   . Hernia (acquired) (recurrent)    R lower abdomen; has seen [facility] (Dr. Zena Amos and recurred  . Hiatal hernia 03/2011  . Impaired fasting glucose   . Internal hemorrhoids 03/2011  . Menstrual migraine    Dr, Jannifer Franklin  . Obesity   . Reflux esophagitis 03/2011  . Seizure disorder ([place])    f/b Dr. Jannifer Franklin  . Seizures ([place]) last seisure june 2012  . Sleep apnea    inconclusive test per pt (some central and obstructive component)  . Sleep apnea 06/07/09   mild complex sleep apnea, worse in supine position (uses CPAP intermittently only)  . Tinea cruris 7/09    Past Surgical History:  Procedure Laterality Date  . CESAREAN SECTION     x2  . CHOLECYSTECTOMY  2005  . COLONOSCOPY  03/19/11   Dr. Olevia Perches; internal hemorrhoids  . ESOPHAGOGASTRODUODENOSCOPY  03/19/11   hiatal hernia,  esophagitis  . KIDNEY STONE SURGERY  08/2010   retrieved with basket (at [facility])  . repair of incisional hernia  2005, 2008   related to laparoscopic cholecystectomy    Family History  Problem Relation Age of Onset  . Arthritis Mother     rheumatoid  . Hyperlipidemia Mother   . Hypertension Mother   . Hyperthyroidism Mother     s/p thyroidectomy, now with hypothyroidism  . Cancer Father 28    AML, lung cancer, kidney cancer (all presented at same time)  . Diabetes Father   . Hypertension Father   . Hyperlipidemia Father   . Psoriasis Father   . Anxiety disorder Father     OCD, nervous breakdown  . Psoriasis Sister   . Irritable bowel syndrome Sister   . Arthritis Sister     psoriatic  . Diabetes Sister   . GER disease Daughter   . Asthma Son   . Asthma Maternal Grandmother   . Hyperlipidemia Maternal Grandmother   . Heart disease Maternal Grandmother   . Stroke Maternal Grandmother   . Diabetes Maternal Grandfather   . Heart disease Maternal Grandfather   . Cancer Paternal Grandmother 51    female (?ovarian)  . Diabetes Paternal Grandmother   . Hypertension Paternal Grandmother   . Diabetes Paternal Grandfather   . Heart disease Paternal Grandfather   . Hypertension Paternal Grandfather   . Diabetes  Paternal Uncle   . Crohn's disease Paternal Uncle   . Cancer Paternal Uncle     ? type  . Colitis Paternal Uncle   . Stroke Maternal Aunt   . Stroke Cousin   . Esophageal cancer Neg Hx   . Stomach cancer Neg Hx     Social history:  reports that she has never smoked. She has never used smokeless tobacco. She reports that she does not drink alcohol or use drugs.    Allergies  Allergen Reactions  . Amoxicillin Hives  . Codeine Itching and Other (See Comments)    Heart races.  . Keppra [Levetiracetam]     Suicide ideation  . Ketorolac Tromethamine Other (See Comments)    Stopped breathing.  . Sulfa Antibiotics Nausea Only  . Transderm-Scop [Scopolamine]  Other (See Comments)    Stopped breathing.    Medications:  Prior to Admission medications   Medication Sig Start Date End Date Taking? Authorizing Provider  Topiramate ER (TROKENDI XR) 50 MG CP24 Take by mouth. 07/04/15  Yes Historical Provider, MD  albuterol (PROVENTIL HFA;VENTOLIN HFA) 108 (90 BASE) MCG/ACT inhaler Inhale 2 puffs into the lungs as needed.      Historical Provider, MD  fluconazole (DIFLUCAN) 150 MG tablet Take 1 tablet (150 mg total) by mouth once. 08/30/12   Camelia Eng Tysinger, PA-C  folic acid (FOLVITE) 1 MG tablet Take 2 mg by mouth daily.     Historical Provider, MD  Golimumab (SIMPONI [place]) Inject into the skin every 30 (thirty) days.      Historical Provider, MD  ibuprofen (ADVIL,MOTRIN) 200 MG tablet Take 400 mg by mouth every 6 (six) hours as needed.     Historical Provider, MD  methotrexate (RHEUMATREX) 2.5 MG tablet Take 15 mg by mouth once a week. Caution:Chemotherapy. Protect from light.    Historical Provider, MD  Multiple Minerals-Vitamins (CALCIUM-MAGNESIUM-ZINC-D3) TABS Take 2 tablets by mouth daily.    Historical Provider, MD  Naproxen Sodium 220 MG CAPS Take by mouth.    Historical Provider, MD  traMADol (ULTRAM) 50 MG tablet Take 1 tablet (50 mg total) by mouth every 6 (six) hours as needed. 07/20/14   Denita Lung, MD    ROS:  Out of a complete 14 system review of symptoms, the patient complains only of the following symptoms, and all other reviewed systems are negative.  Incontinence of the bladder, frequency of urination Joint pain, joint swelling, walking difficulty Leg swelling Frequent waking Weakness  Blood pressure (!) 150/94, pulse 72, height 5\' 2"  (1.575 m), weight (!) 302 lb 8 oz (137.2 kg).  Physical Exam  General: The patient is alert and cooperative at the time of the examination. The patient is markedly obese.  Skin: 3+ edema below the knees is noted bilaterally.    Neurologic Exam  Mental status: The patient is alert and oriented  x 3 at the time of the examination. The patient has apparent normal recent and remote memory, with an apparently normal attention span and concentration ability.   Cranial nerves: Facial symmetry is present. Speech is normal, no aphasia or dysarthria is noted. Extraocular movements are full. Visual fields are full.  Motor: The patient has good strength in all 4 extremities.  Sensory examination: Soft touch sensation is symmetric on the face, arms, and legs.  Coordination: The patient has good finger-nose-finger, but she has difficulty performing heel-to-shin bilaterally.  Gait and station: The patient has a limping gait on the right leg. Tandem gait was  not attempted. Romberg is negative. No drift is seen.   Reflexes: Deep tendon reflexes are symmetric, but are depressed.   Assessment/Plan:  1. Migraine headaches  2. History of seizures  The patient is doing well with her seizures and migraine on Trokendi. We will continue the medication for now. She will follow-up in one year, sooner if needed. The main issue with the patient at this point is her significant arthritis.  Jill Alexanders MD 10/10/2015 9:16 AM  [facility] [address] [place], [place] [address]  Phone [phone] Fax [phone]

## 2016-01-01 ENCOUNTER — Ambulatory Visit (INDEPENDENT_AMBULATORY_CARE_PROVIDER_SITE_OTHER): Payer: 59 | Admitting: Rheumatology

## 2016-01-01 DIAGNOSIS — Z79899 Other long term (current) drug therapy: Secondary | ICD-10-CM

## 2016-01-01 DIAGNOSIS — L408 Other psoriasis: Secondary | ICD-10-CM

## 2016-01-01 DIAGNOSIS — M1712 Unilateral primary osteoarthritis, left knee: Secondary | ICD-10-CM | POA: Diagnosis not present

## 2016-01-01 DIAGNOSIS — M25561 Pain in right knee: Secondary | ICD-10-CM

## 2016-01-01 DIAGNOSIS — L4050 Arthropathic psoriasis, unspecified: Secondary | ICD-10-CM

## 2016-01-09 ENCOUNTER — Ambulatory Visit: Payer: 59 | Admitting: Rheumatology

## 2016-01-23 ENCOUNTER — Other Ambulatory Visit: Payer: Self-pay | Admitting: Rheumatology

## 2016-01-23 ENCOUNTER — Telehealth: Payer: Self-pay | Admitting: Radiology

## 2016-01-23 ENCOUNTER — Other Ambulatory Visit: Payer: Self-pay | Admitting: Radiology

## 2016-01-23 MED ORDER — SECUKINUMAB 150 MG/ML ~~LOC~~ SOAJ: 300.0000 mg | SUBCUTANEOUS | 0 refills | Status: DC

## 2016-01-23 NOTE — Telephone Encounter (Addendum)
Patients Cosentyx approved by insurance Does she want to start? Will call her  11/26/15 labs WNL Tb neg 6/231/7 Last visit 01/01/16 Next visit 05/05/16

## 2016-01-23 NOTE — Telephone Encounter (Signed)
Sent my chart message to check if she is ready to start on cosentyx

## 2016-02-03 ENCOUNTER — Ambulatory Visit (INDEPENDENT_AMBULATORY_CARE_PROVIDER_SITE_OTHER): Payer: 59 | Admitting: Rheumatology

## 2016-02-03 ENCOUNTER — Emergency Department (HOSPITAL_COMMUNITY)
Admission: EM | Admit: 2016-02-03 | Discharge: 2016-02-03 | Disposition: A | Discharge status: Home / Self Care | Payer: 59 | Attending: Emergency Medicine | Admitting: Emergency Medicine

## 2016-02-03 ENCOUNTER — Encounter (HOSPITAL_COMMUNITY): Payer: Self-pay | Admitting: Emergency Medicine

## 2016-02-03 ENCOUNTER — Encounter: Payer: Self-pay | Admitting: Rheumatology

## 2016-02-03 VITALS — BP 139/92 | HR 75

## 2016-02-03 DIAGNOSIS — L4050 Arthropathic psoriasis, unspecified: Secondary | ICD-10-CM | POA: Diagnosis not present

## 2016-02-03 DIAGNOSIS — R21 Rash and other nonspecific skin eruption: Secondary | ICD-10-CM | POA: Insufficient documentation

## 2016-02-03 DIAGNOSIS — J45909 Unspecified asthma, uncomplicated: Secondary | ICD-10-CM | POA: Insufficient documentation

## 2016-02-03 DIAGNOSIS — N189 Chronic kidney disease, unspecified: Secondary | ICD-10-CM | POA: Diagnosis not present

## 2016-02-03 DIAGNOSIS — T7840XA Allergy, unspecified, initial encounter: Secondary | ICD-10-CM

## 2016-02-03 DIAGNOSIS — Z79899 Other long term (current) drug therapy: Secondary | ICD-10-CM

## 2016-02-03 DIAGNOSIS — T50995A Adverse effect of other drugs, medicaments and biological substances, initial encounter: Secondary | ICD-10-CM | POA: Diagnosis not present

## 2016-02-03 DIAGNOSIS — L408 Other psoriasis: Secondary | ICD-10-CM | POA: Diagnosis not present

## 2016-02-03 MED ORDER — SECUKINUMAB 150 MG/ML ~~LOC~~ SOAJ
300.0000 mg | Freq: Once | SUBCUTANEOUS | Status: AC
  Administered 2016-02-03: 300 mg via SUBCUTANEOUS

## 2016-02-03 MED ORDER — FAMOTIDINE 20 MG PO TABS
20.0000 mg | ORAL_TABLET | Freq: Once | ORAL | Status: AC
  Administered 2016-02-03: 20 mg via ORAL
  Filled 2016-02-03: qty 1

## 2016-02-03 MED ORDER — IBUPROFEN 400 MG PO TABS
600.0000 mg | ORAL_TABLET | Freq: Once | ORAL | Status: AC
  Administered 2016-02-03: 600 mg via ORAL
  Filled 2016-02-03: qty 1

## 2016-02-03 MED ORDER — METHYLPREDNISOLONE SODIUM SUCC 125 MG IJ SOLR
125.0000 mg | Freq: Once | INTRAMUSCULAR | Status: DC
  Filled 2016-02-03: qty 2

## 2016-02-03 MED ORDER — FAMOTIDINE IN NACL 20-0.9 MG/50ML-% IV SOLN
20.0000 mg | Freq: Once | INTRAVENOUS | Status: DC
  Filled 2016-02-03: qty 50

## 2016-02-03 NOTE — ED Triage Notes (Signed)
Pt arrives from ortho office via [facility] reporting new onset rash on chest with tingling to lips and tongue after injection Cosentyx.  EMS reports received 50 mg Benadryl PO around 1000.  Pt reports hx tongue tingling as aura to impending seizure.  Pt reports compliance with seizure meds. NAD noted at this time, resp e/u.

## 2016-02-03 NOTE — ED Notes (Signed)
Pt anxious -- requesting not to have IV started unless "It's necessary" explained to pt that if it was an emergency, it would be more difficult to start an IV-- will place IV team consult. Pt requesting "the spray for numbing". Pt states "I can control this-- I won't have a seizure, and I wont have any issues with this allergy" explained that if this was  Medical that pt could not control symptoms.

## 2016-02-03 NOTE — Progress Notes (Signed)
Began to assess patient for PIV placement. Dorothea Ogle PA came in to speak with pt. He discussed PIV and stated "since you are doing better, we can give you orals and hold off on the PIV. Notified Chief Executive Officer ER nurse. VU. Fran Lowes, RN VAST

## 2016-02-03 NOTE — Progress Notes (Signed)
Office Visit Note  Patient: Tiffany Wilkins             Date of Birth: 10-08-1965           MRN: [id number]             PCP: Vikki Ports, MD Referring: Rita Ohara, MD Visit Date: 02/03/2016 Occupation: @GUAROCC @    Subjective:  Injections Patient came in for nurse visit for Cosentyx injection since her Simponi injections are no longer helping her Psoriatic arthritis/psoriasis.  History of Present Illness: Tiffany Wilkins is a 50 y.o. female  Patient presents today for her first Cosentyx injection. Few minutes after the injection patient started having redness across her chest. She had tingling in her tongue. He did not have any associated shortness of breath.    Activities of Daily Living:  Patient reports morning stiffness for 60 minutes.   Patient Reports nocturnal pain.  Difficulty dressing/grooming: Reports Difficulty climbing stairs: Reports Difficulty getting out of chair: Reports Difficulty using hands for taps, buttons, cutlery, and/or writing: Reports   Review of Systems  Constitutional: Negative for fatigue.  HENT: Positive for sore tongue (numbness and tingling to tongue after COSENTYX injection). Negative for mouth sores and mouth dryness.   Eyes: Negative for dryness.  Respiratory: Negative for shortness of breath (no SOB; pt talking in complete sentences).   Gastrointestinal: Negative for constipation and diarrhea.  Musculoskeletal: Negative for myalgias and myalgias.  Skin: Positive for rash and redness (to CHEST after 1st cosentyx injection in office today). Negative for sensitivity to sunlight.  Psychiatric/Behavioral: Negative for decreased concentration and sleep disturbance.    PMFS History:  Patient Active Problem List   Diagnosis Date Noted  . Hepatic steatosis 06/27/2014  . BMI 50.0-59.9, adult ([place]) 06/27/2014  . Fibroid, uterine 06/27/2014  . Abdominal pain, chronic, right lower quadrant 06/27/2014  . Obesity hypoventilation syndrome ([place])  12/08/2013  . Nocturnal seizures ([place]) 12/08/2013  . OSA (obstructive sleep apnea) 12/08/2013  . Noncompliance with CPAP treatment 12/08/2013  . Psoriasis 11/29/2013  . Seizure disorder, complex partial ([place]) 06/01/2012  . Migraine 06/01/2012  . OSA on CPAP 01/04/2012  . Hiatal hernia 03/26/2011  . Internal hemorrhoid 03/24/2011  . Reflux esophagitis 03/24/2011  . Psoriatic arthritis ([place]) 01/22/2011  . Hernia of abdominal wall 01/22/2011    Past Medical History:  Diagnosis Date  . Allergic rhinitis, cause unspecified   . Anal fissure   . Arthritis    inflammatory and psoriatic--Dr. Estanislado Pandy  . Asthma    related to allergies and colds  . Chronic bronchitis    gets bronchitis yearly with colds  . Chronic kidney disease kidney stones  . Fracture    stress fracture right foot  . GERD (gastroesophageal reflux disease)   . Hernia (acquired) (recurrent)    R lower abdomen; has seen [facility] (Dr. Zena Amos and recurred  . Hiatal hernia 03/2011  . Impaired fasting glucose   . Internal hemorrhoids 03/2011  . Menstrual migraine    Dr, Jannifer Franklin  . Obesity   . Reflux esophagitis 03/2011  . Seizure disorder ([place])    f/b Dr. Jannifer Franklin  . Seizures ([place]) last seisure june 2012  . Sleep apnea    inconclusive test per pt (some central and obstructive component)  . Sleep apnea 06/07/09   mild complex sleep apnea, worse in supine position (uses CPAP intermittently only)  . Tinea cruris 7/09    Family History  Problem Relation Age of Onset  . Arthritis  Mother     rheumatoid  . Hyperlipidemia Mother   . Hypertension Mother   . Hyperthyroidism Mother     s/p thyroidectomy, now with hypothyroidism  . Cancer Father 27    AML, lung cancer, kidney cancer (all presented at same time)  . Diabetes Father   . Hypertension Father   . Hyperlipidemia Father   . Psoriasis Father   . Anxiety disorder Father     OCD, nervous breakdown  . Psoriasis Sister   . Irritable bowel syndrome Sister   .  Arthritis Sister     psoriatic  . Diabetes Sister   . GER disease Daughter   . Asthma Son   . Asthma Maternal Grandmother   . Hyperlipidemia Maternal Grandmother   . Heart disease Maternal Grandmother   . Stroke Maternal Grandmother   . Diabetes Maternal Grandfather   . Heart disease Maternal Grandfather   . Cancer Paternal Grandmother 34    female (?ovarian)  . Diabetes Paternal Grandmother   . Hypertension Paternal Grandmother   . Diabetes Paternal Grandfather   . Heart disease Paternal Grandfather   . Hypertension Paternal Grandfather   . Diabetes Paternal Uncle   . Crohn's disease Paternal Uncle   . Cancer Paternal Uncle     ? type  . Colitis Paternal Uncle   . Stroke Maternal Aunt   . Stroke Cousin   . Esophageal cancer Neg Hx   . Stomach cancer Neg Hx    Past Surgical History:  Procedure Laterality Date  . CESAREAN SECTION     x2  . CHOLECYSTECTOMY  2005  . COLONOSCOPY  03/19/11   Dr. Olevia Perches; internal hemorrhoids  . ESOPHAGOGASTRODUODENOSCOPY  03/19/11   hiatal hernia, esophagitis  . KIDNEY STONE SURGERY  08/2010   retrieved with basket (at [facility])  . repair of incisional hernia  2005, 2008   related to laparoscopic cholecystectomy   Social History   Social History Narrative   Patient Lives with husband [name])  and 2 children (son and daughter, 78, 20)   Patient is right handed.   Patient has high school education.   Patient drinks 2 cups daily- coffee/soda                 Objective: Vital Signs: BP (!) 139/92 (BP Location: Left Arm, Patient Position: Sitting, Cuff Size: Large)   Pulse 75    Physical Exam  Constitutional: She is oriented to person, place, and time. She appears well-developed and well-nourished.  HENT:  Head: Normocephalic and atraumatic.  Eyes: EOM are normal. Pupils are equal, round, and reactive to light.  Cardiovascular: Normal rate, regular rhythm and normal heart sounds.  Exam reveals no gallop and no friction rub.   No  murmur heard. Pulmonary/Chest: Effort normal and breath sounds normal. No stridor (no stridor ; no tongue swelling). She has no wheezes. She has no rales.  Abdominal: Soft. Bowel sounds are normal. She exhibits no distension. There is no tenderness. There is no guarding. No hernia.  Musculoskeletal: Normal range of motion. She exhibits no edema, tenderness or deformity.  Lymphadenopathy:    She has no cervical adenopathy.  Neurological: She is alert and oriented to person, place, and time. Coordination normal.  Skin: Skin is warm and dry. Capillary refill takes less than 2 seconds. Rash (anterior chest wall with rash/allergic reaction) noted.  Psychiatric: She has a normal mood and affect. Her behavior is normal.     Musculoskeletal Exam:  Decreased range of  motion of right knee right hip secondary to pain Decreased fist formation Poor extension flexion of wrists Fibromyalgia tender points are all absent  CDAI Exam: CDAI Homunculus Exam:   Tenderness:  RLE: tibiofemoral  Joint Counts:  CDAI Tender Joint count: 1 CDAI Swollen Joint count: 0  Global Assessments:  Patient Global Assessment: 8 Provider Global Assessment: 8  CDAI Calculated Score: 17    Investigation: No additional findings.   Imaging: No results found.  Speciality Comments: No specialty comments available.  ============== HERE IS THE NOTE FROM Jan 01, 2016 VISIT  ====> Patient reports that she is not responding to the Simponi subcu as she used to in the past.  She has been taking it regularly.  When she presented on the last visit, she was complaining of dactylitis to the left 3rd finger and right-sided neck pain.  We gave her a prednisone taper for a few days, and that helped some. Currently she is experiencing a significant amount of pain to the right knee joint with occasional swelling.  Her most recent swelling was a few days ago.  She has noticed an association with having partial relief for a few days  after she takes her Simponi subcu.  Then the right knee joint pain comes back, and as noted already, every time she takes the Simponi subcu she again gets improvement for a few days with pain returning after a few days.  When all is well, her pain in her right knee is rated as 2 on a scale of 0 to 10 with flares going as high as a 10.  She finds it hard to bear weight on the right knee as well as feeling like it is going to give way and she is going to fall.  She actually is using a walker because she has fallen once or twice already because the knee gave way.  Patient is 309 pounds which is complicating the issue, and she probably has some osteoarthritis, but I am afraid of damage to her extremities and her body with any future falls.  So far she has been lucky and has not injured anything.   Her psoriasis is also not fully controlled with Simponi subcuT.  She has 3 spots on her body with belly button area always being psoriatic.  She actually has to use the clobetasol cream on a regular basis to keep it under fair control.  She is unable to get any long-term relief of her psoriasis.   Patient feels that the Simponi subcuT used to work much better initially, and she was quite pleased with how well it worked in the past, but now she is barely finding it to be beneficial.  She also takes methotrexate 7 per week and folic acid 2 per day.  Note that her right knee is hurting, but her left knee is also hurting but just not as bad as the right knee.  She also has a history of epileptic seizure, and she is quite concerned of what medication she might be able to take if we decide to switch her from Simponi subcu to some other medication to address her psoriatic arthritis and psoriasis and her right knee pain.  ==============  Procedures:  No procedures performed Allergies: Cosentyx [secukinumab]; Hydrocodone; Amoxicillin; Codeine; Keppra [levetiracetam]; Ketorolac tromethamine; Sulfa antibiotics; and  Transderm-scop [scopolamine]   Assessment / Plan:     Visit Diagnoses: Psoriatic arthritis ([place]) - Plan: Secukinumab SOAJ 300 mg  Other psoriasis  Allergic reaction to drug,  initial encounter  High risk medications (not anticoagulants) long-term use   Patient presents today for her first Cosentyx injection. Few minutes after the injection patient started having redness across her chest. She had tingling in her tongue. She did not have any associated shortness of breath.  Assessment is I was made aware of her situation, I immediately went to her room. We were able to administer 50 mg of Benadryl by mouth. We had epinephrine ready to go but we did not need to use it.  She was doing well 5 minutes after giving her the Benadryl. Her symptoms did not worsen.  Nurse was with the patient continuous basis to monitor patient.  While the Benadryl was being administered, Gerlean Ren called EMS. She gave him the address for our [address]. [place] office. Unfortunately 15 minutes later when they were not here we discovered that they had actually gone to the main [facility] building. It were directed to come to our building.  They were able to come in less than 3 minute to our office.  We summarized the patient's symptoms to the EMS after the Cosentyx caused allergic Rxn.  Patient was taken in the ambulance to [facility] ER for evaluation and treatment. Patient's 01/01/2016 medical record was printed and given to the EMS. He needed the list of her medications as well as her allergies. I hand wrote that the Cosentyx was a new allergy for her and we gave her Benadryl for treatment and the symptoms that patient experienced. Patient was alert and oriented 3. Her symptoms were not worsening. At the time she was leaving with the EMS, she had minimal redness across her chest and ongoing tingling of her tongue. I have any trouble swallowing she did not have any airway  compromise. Note during this whole event she did not have any tongue swelling. She was able to talk without any problems.  NOTE: THERE WERE NO CHANGE IN PATIENTS SIGNS OR SYMPTOMS since her Jan 01, 2016 visit. Since today's Cosentyx will need to be stopped, pt may need to go back to simponi subq and mtx as per last office visit 01-01-16:  ===>1.)  Psoriatic arthritis.  Flare to the right knee joint, left 3rd digit with dactylitis, and flare to the right 2nd, 3rd, and 4th toes with dactylitis.       2.)  Psoriasis.  No change.  Continues to have 3 psoriatic spots to her body.  The worst area is the belly button.  She has to use the clobetasol cream on a regular basis.   Orders: No orders of the defined types were placed in this encounter.  Meds ordered this encounter  Medications  . Secukinumab SOAJ 300 mg    Face-to-face time spent with patient was 40 minutes. 50% of time was spent in counseling and coordination of care.  Follow-Up Instructions: Return in about 4 weeks (around 03/02/2016) for PsA, Ps, HRRX, allergic Rxn to [facility].   Eliezer Lofts, PA-C

## 2016-02-03 NOTE — ED Notes (Signed)
Ambulated to bathroom without difficulty-- limping on right leg-- states it is from her arthritis.

## 2016-02-03 NOTE — Discharge Instructions (Signed)
Please continue taking Benadryl at home as needed. You may also take Zantac to help with the itching. Continue her last dose of prednisone. Follow up with her primary care doctor. Return to the ED if he developed worsening symptoms including shortness of breath, swelling of your tongue or lips, worsening rash.

## 2016-02-03 NOTE — ED Provider Notes (Signed)
[facility] DEPT Provider Note   CSN: [id number] Arrival date & time: 02/03/16  1028     History   Chief Complaint Chief Complaint  Patient presents with  . Allergic Reaction    HPI Tiffany Wilkins is a 50 y.o. female.  50 year old Caucasian female the past medical history significant for seizure disorder, medication allergies and arthritis that presents to the ED today with possible allergic reaction. Patient was at her rheumatologist today getting Consentyx injection in upper thighs for arthritis around 930 AM. She then developed a pruritic rash on her chest and tingling to her lips and tongue after the injection. Patient was given 50 mg of Benadryl by mouth by EMS. Patient has a history of medication allergies anaphylaxis in the past. Patient states she also history of tongue tingling as her aura for impending seizures. Patient states that she is compliant with her seizure medications. States her last seizure was approximately 6-7 months ago. Patient denies any difficulty breathing or feeling of throat swelling. She denies any fever, chills, headache, vision changes, lightheadedness, dizziness, chest pain, shortness of breath, abdominal pain, nausea, emesis, urinary symptoms, change in bowel habits.  Patient is currently on prednisone taper for bronchitis episode.      Past Medical History:  Diagnosis Date  . Allergic rhinitis, cause unspecified   . Anal fissure   . Arthritis    inflammatory and psoriatic--Dr. Estanislado Pandy  . Asthma    related to allergies and colds  . Chronic bronchitis    gets bronchitis yearly with colds  . Chronic kidney disease kidney stones  . Fracture    stress fracture right foot  . GERD (gastroesophageal reflux disease)   . Hernia (acquired) (recurrent)    R lower abdomen; has seen [facility] (Dr. Zena Amos and recurred  . Hiatal hernia 03/2011  . Impaired fasting glucose   . Internal hemorrhoids 03/2011  . Menstrual migraine    Dr, Jannifer Franklin  .  Obesity   . Reflux esophagitis 03/2011  . Seizure disorder ([place])    f/b Dr. Jannifer Franklin  . Seizures ([place]) last seisure june 2012  . Sleep apnea    inconclusive test per pt (some central and obstructive component)  . Sleep apnea 06/07/09   mild complex sleep apnea, worse in supine position (uses CPAP intermittently only)  . Tinea cruris 7/09    Patient Active Problem List   Diagnosis Date Noted  . Hepatic steatosis 06/27/2014  . BMI 50.0-59.9, adult ([place]) 06/27/2014  . Fibroid, uterine 06/27/2014  . Abdominal pain, chronic, right lower quadrant 06/27/2014  . Obesity hypoventilation syndrome ([place]) 12/08/2013  . Nocturnal seizures ([place]) 12/08/2013  . OSA (obstructive sleep apnea) 12/08/2013  . Noncompliance with CPAP treatment 12/08/2013  . Psoriasis 11/29/2013  . Seizure disorder, complex partial ([place]) 06/01/2012  . Migraine 06/01/2012  . OSA on CPAP 01/04/2012  . Hiatal hernia 03/26/2011  . Internal hemorrhoid 03/24/2011  . Reflux esophagitis 03/24/2011  . Psoriatic arthritis ([place]) 01/22/2011  . Hernia of abdominal wall 01/22/2011    Past Surgical History:  Procedure Laterality Date  . CESAREAN SECTION     x2  . CHOLECYSTECTOMY  2005  . COLONOSCOPY  03/19/11   Dr. Olevia Perches; internal hemorrhoids  . ESOPHAGOGASTRODUODENOSCOPY  03/19/11   hiatal hernia, esophagitis  . KIDNEY STONE SURGERY  08/2010   retrieved with basket (at [facility])  . repair of incisional hernia  2005, 2008   related to laparoscopic cholecystectomy    OB History    Gravida Para  Term Preterm AB Living   2 2       2    SAB TAB Ectopic Multiple Live Births                   Home Medications    Prior to Admission medications   Medication Sig Start Date End Date Taking? Authorizing Provider  albuterol (PROVENTIL HFA;VENTOLIN HFA) 108 (90 BASE) MCG/ACT inhaler Inhale 2 puffs into the lungs as needed.     Yes Historical Provider, MD  folic acid (FOLVITE) 1 MG tablet Take 2 mg by mouth daily.    Yes Historical  Provider, MD  Golimumab (SIMPONI [place]) Inject into the skin every 30 (thirty) days.     Yes Historical Provider, MD  ibuprofen (ADVIL,MOTRIN) 200 MG tablet Take 400 mg by mouth every 6 (six) hours as needed.    Yes Historical Provider, MD  methotrexate (RHEUMATREX) 2.5 MG tablet Take 20 mg by mouth once a week. Caution:Chemotherapy. Protect from light. ( Friday )   Yes Historical Provider, MD  Multiple Minerals-Vitamins (CALCIUM-MAGNESIUM-ZINC-D3) TABS Take 2 tablets by mouth daily.   Yes Historical Provider, MD  Naproxen Sodium 220 MG CAPS Take by mouth.   Yes Historical Provider, MD  Topiramate ER (TROKENDI XR) 50 MG CP24 Take by mouth. 07/04/15  Yes Historical Provider, MD  fluconazole (DIFLUCAN) 150 MG tablet Take 1 tablet (150 mg total) by mouth once. Patient not taking: Reported on 02/03/2016 08/30/12   Camelia Eng Tysinger, PA-C  Secukinumab (COSENTYX SENSOREADY PEN) 150 MG/ML SOAJ Inject 300 mg into the skin once a week. Load weeks 0,1,2,3,4 then once a month Patient not taking: Reported on 02/03/2016 01/23/16   Bo Merino, MD    Family History Family History  Problem Relation Age of Onset  . Arthritis Mother     rheumatoid  . Hyperlipidemia Mother   . Hypertension Mother   . Hyperthyroidism Mother     s/p thyroidectomy, now with hypothyroidism  . Cancer Father 100    AML, lung cancer, kidney cancer (all presented at same time)  . Diabetes Father   . Hypertension Father   . Hyperlipidemia Father   . Psoriasis Father   . Anxiety disorder Father     OCD, nervous breakdown  . Psoriasis Sister   . Irritable bowel syndrome Sister   . Arthritis Sister     psoriatic  . Diabetes Sister   . GER disease Daughter   . Asthma Son   . Asthma Maternal Grandmother   . Hyperlipidemia Maternal Grandmother   . Heart disease Maternal Grandmother   . Stroke Maternal Grandmother   . Diabetes Maternal Grandfather   . Heart disease Maternal Grandfather   . Cancer Paternal Grandmother 33     female (?ovarian)  . Diabetes Paternal Grandmother   . Hypertension Paternal Grandmother   . Diabetes Paternal Grandfather   . Heart disease Paternal Grandfather   . Hypertension Paternal Grandfather   . Diabetes Paternal Uncle   . Crohn's disease Paternal Uncle   . Cancer Paternal Uncle     ? type  . Colitis Paternal Uncle   . Stroke Maternal Aunt   . Stroke Cousin   . Esophageal cancer Neg Hx   . Stomach cancer Neg Hx     Social History Social History  Substance Use Topics  . Smoking status: Never Smoker  . Smokeless tobacco: Never Used  . Alcohol use No     Comment: rarely     Allergies  Cosentyx [secukinumab]; Hydrocodone; Amoxicillin; Codeine; Keppra [levetiracetam]; Ketorolac tromethamine; Sulfa antibiotics; and Transderm-scop [scopolamine]   Review of Systems Review of Systems  Constitutional: Negative for chills and fever.  HENT: Negative for congestion, facial swelling and trouble swallowing.   Eyes: Negative for photophobia, itching and visual disturbance.  Respiratory: Negative for cough, chest tightness, shortness of breath and wheezing.   Cardiovascular: Negative for chest pain, palpitations and leg swelling.  Gastrointestinal: Negative for abdominal pain, diarrhea, nausea and vomiting.  Genitourinary: Negative.   Musculoskeletal: Negative.   Skin: Positive for rash.  Neurological: Negative for dizziness, weakness, numbness and headaches.  All other systems reviewed and are negative.    Physical Exam Updated Vital Signs BP 136/91 (BP Location: Left Wrist)   Pulse 73   Temp 98.5 F (36.9 C) (Oral)   Resp 16   Ht 5\' 2"  (1.575 m)   Wt (!) 137.4 kg   SpO2 99%   BMI 55.42 kg/m   Physical Exam  Constitutional: She is oriented to person, place, and time. She appears well-developed and well-nourished. No distress.  Patient is speaking in complete sentences and maintaining her airway and secretions. Patient seems drowsy and Benadryl.  HENT:  Head:  Normocephalic and atraumatic.  Mouth/Throat: Uvula is midline, oropharynx is clear and moist and mucous membranes are normal. No posterior oropharyngeal edema.  No angioedema of tongue or lips noted. Oropharynx is clear without any erythema.  Eyes: Conjunctivae are normal. Right eye exhibits no discharge. Left eye exhibits no discharge. No scleral icterus.  Neck: Normal range of motion. Neck supple. No thyromegaly present.  Cardiovascular: Normal rate, regular rhythm, normal heart sounds and intact distal pulses.  Exam reveals no gallop and no friction rub.   No murmur heard. Pulmonary/Chest: Effort normal and breath sounds normal. No respiratory distress. She has no wheezes.  Patient is not hypoxic or tachypnea at this time. No stridor noted.  Abdominal: Soft. Bowel sounds are normal. She exhibits no distension. There is no tenderness. There is no rebound and no guarding.  Musculoskeletal: Normal range of motion.  Moving all 4 extremities without any difficulties. In relating to bathroom without any difficulties.  Lymphadenopathy:    She has no cervical adenopathy.  Neurological: She is alert and oriented to person, place, and time.  Skin: Skin is warm and dry. Capillary refill takes less than 2 seconds. No rash noted. No erythema.  No rash appreciated. Patient has no complaints of pruritus at this time.  Nursing note and vitals reviewed.    ED Treatments / Results  Labs (all labs ordered are listed, but only abnormal results are displayed) Labs Reviewed - No data to display  EKG  EKG Interpretation None       Radiology No results found.  Procedures Procedures (including critical care time)  Medications Ordered in ED Medications  famotidine (PEPCID) tablet 20 mg (20 mg Oral Given 02/03/16 1235)  ibuprofen (ADVIL,MOTRIN) tablet 600 mg (600 mg Oral Given 02/03/16 1234)     Initial Impression / Assessment and Plan / ED Course  I have reviewed the triage vital signs and  the nursing notes.  Pertinent labs & imaging results that were available during my care of the patient were reviewed by me and considered in my medical decision making (see chart for details).  Clinical Course   Patient presented to ED after allergic reaction with her arthritis injection. Patient was given Benadryl prior to arrival that resolved her rash. She was given Pepcid in ED. Patient  currently on prednisone for bronchitis. Patient re-evaluated prior to dc, is hemodynamically stable, in no respiratory distress, and denies the feeling of throat closing. She is currently sleeping on my exam and no rash is noted to the chest.  Pt has been advised to take OTC benadryl & return to the ED if they have a mod-severe allergic rxn including throat closing, difficulty breathing, swelling of lips face or tongue. Pt is to follow up with their PCP. Pt is agreeable with plan & verbalizes understanding.    Final Clinical Impressions(s) / ED Diagnoses   Final diagnoses:  Allergic reaction, initial encounter    New Prescriptions New Prescriptions   No medications on file     Doristine Devoid, PA-C 02/03/16 [place], MD 02/08/16 [phone]

## 2016-02-04 ENCOUNTER — Telehealth: Payer: Self-pay | Admitting: Rheumatology

## 2016-02-04 NOTE — Telephone Encounter (Signed)
Specialty pharmacy required through her insurance.  Send to previous pharmacy.

## 2016-02-04 NOTE — Telephone Encounter (Signed)
Did you discuss [facility] pharmacy with her? When we were changing to Cosentyx, do you want to discuss sending Simponi there, or send to previous pharmacy ?

## 2016-02-04 NOTE — Telephone Encounter (Signed)
-----   Message from Eliezer Lofts, [place] sent at 02/03/2016 12:51 PM EST -----  Patient had an allergic reaction to her Cosentyx injection today.  Please schedule her for follow-up with Dr. Estanislado Pandy 4 weeks from now.

## 2016-02-04 NOTE — Telephone Encounter (Signed)
I spoke with patient today. She states she is doing well. She wants to go back on Simponi subcutaneous. I discussed the option of switching to Stelara she will review the medication for future but at this point she wants to stay on Simponi subcutaneous.

## 2016-02-04 NOTE — Telephone Encounter (Signed)
error 

## 2016-02-05 MED ORDER — GOLIMUMAB 50 MG/0.5ML ~~LOC~~ SOSY: PREFILLED_SYRINGE | SUBCUTANEOUS | 0 refills | Status: DC

## 2016-02-05 NOTE — Addendum Note (Signed)
Addended byCandice Camp on: 02/05/2016 09:02 AM   Modules accepted: Orders

## 2016-02-05 NOTE — Telephone Encounter (Signed)
CaseId:[id number];Status:Cancelled;Explanation:PA not Required.Medication is Covered; this is great. Pt should be able to get this medication.   When do you want her to restart Simponi, she had one injection of Cosentyx, ok to start in one week since she has not loaded?

## 2016-02-05 NOTE — Telephone Encounter (Signed)
She should wait one month.Thanks

## 2016-02-05 NOTE — Telephone Encounter (Signed)
11/25/15 labs WNL Simponi sent to [facility]  This may need a prior auth. I have pulled up the form, likely to be denied, her notes do not indicate a positive clinical response.

## 2016-02-10 NOTE — Telephone Encounter (Signed)
Thanks, now the Simponi has been denied. I have asked patient what she thinks about the Stelara (Simponi not a covered product per [employer] )

## 2016-02-11 ENCOUNTER — Other Ambulatory Visit: Payer: Self-pay | Admitting: Radiology

## 2016-02-11 ENCOUNTER — Telehealth: Payer: Self-pay | Admitting: Radiology

## 2016-02-11 MED ORDER — GOLIMUMAB 50 MG/0.5ML ~~LOC~~ SOSY: PREFILLED_SYRINGE | SUBCUTANEOUS | 0 refills | Status: DC

## 2016-02-11 NOTE — Telephone Encounter (Signed)
Message sent to patient/ regarding PA of simponi

## 2016-02-12 ENCOUNTER — Telehealth: Payer: Self-pay | Admitting: Radiology

## 2016-02-12 NOTE — Telephone Encounter (Signed)
Phone call to [facility] for patient. Please fill Simponi, Cosentyx is an allergy.

## 2016-02-13 ENCOUNTER — Telehealth: Payer: Self-pay | Admitting: Radiology

## 2016-02-13 MED ORDER — GOLIMUMAB 50 MG/0.5ML ~~LOC~~ SOAJ: SUBCUTANEOUS | 0 refills | Status: DC

## 2016-02-13 NOTE — Telephone Encounter (Signed)
Discussed with [place] also, and I have sent message to patient. We can not figure out why [facility] is getting a denial for PA when no PA is required, I have called [facility] and asked them to call the insurance company regarding this.

## 2016-02-13 NOTE — Telephone Encounter (Signed)
[facility] has now told me this is covered, claim went through.

## 2016-02-13 NOTE — Telephone Encounter (Signed)
Can we discuss the patient and PA when you get a chance?

## 2016-03-16 ENCOUNTER — Other Ambulatory Visit: Payer: Self-pay | Admitting: Rheumatology

## 2016-03-17 ENCOUNTER — Other Ambulatory Visit: Payer: Self-pay | Admitting: Rheumatology

## 2016-03-17 DIAGNOSIS — Z79899 Other long term (current) drug therapy: Secondary | ICD-10-CM

## 2016-03-17 NOTE — Telephone Encounter (Signed)
Labs were due in Dec, message sent to patient  Next visit 05/05/16 Last visit 01/01/16

## 2016-03-19 LAB — CBC WITH DIFFERENTIAL/PLATELET
BASOS ABS: 0 10*3/uL (ref 0.0–0.2)
Basos: 1 %
EOS (ABSOLUTE): 0.1 10*3/uL (ref 0.0–0.4)
EOS: 1 %
Hematocrit: 46 % (ref 34.0–46.6)
Hemoglobin: 15.7 g/dL (ref 11.1–15.9)
LYMPHS: 42 %
Lymphocytes Absolute: 2.7 10*3/uL (ref 0.7–3.1)
MCH: 30.4 pg (ref 26.6–33.0)
MCHC: 34.1 g/dL (ref 31.5–35.7)
MCV: 89 fL (ref 79–97)
Monocytes Absolute: 0.7 10*3/uL (ref 0.1–0.9)
Monocytes: 10 %
Neutrophils Absolute: 3.1 10*3/uL (ref 1.4–7.0)
Neutrophils: 46 %
Platelets: 278 10*3/uL (ref 150–379)
RBC: 5.16 x10E6/uL (ref 3.77–5.28)
RDW: 15.2 % (ref 12.3–15.4)
WBC: 6.5 10*3/uL (ref 3.4–10.8)

## 2016-03-19 LAB — COMPREHENSIVE METABOLIC PANEL
A/G RATIO: 1.6 (ref 1.2–2.2)
ALT: 37 IU/L — ABNORMAL HIGH (ref 0–32)
AST: 23 IU/L (ref 0–40)
Albumin: 4.5 g/dL (ref 3.5–5.5)
Alkaline Phosphatase: 82 IU/L (ref 39–117)
BILIRUBIN TOTAL: 0.2 mg/dL (ref 0.0–1.2)
BUN/Creatinine Ratio: 21 (ref 9–23)
BUN: 17 mg/dL (ref 6–24)
CO2: 29 mmol/L (ref 18–29)
Calcium: 8.9 mg/dL (ref 8.7–10.2)
Chloride: 102 mmol/L (ref 96–106)
Creatinine, Ser: 0.8 mg/dL (ref 0.57–1.00)
GFR calc Af Amer: 99 mL/min/{1.73_m2} (ref >59)
GFR calc non Af Amer: 86 mL/min/{1.73_m2} (ref >59)
GLOBULIN, TOTAL: 2.9 g/dL (ref 1.5–4.5)
Glucose: 98 mg/dL (ref 65–99)
POTASSIUM: 4.6 mmol/L (ref 3.5–5.2)
SODIUM: 140 mmol/L (ref 134–144)
TOTAL PROTEIN: 7.4 g/dL (ref 6.0–8.5)

## 2016-03-19 NOTE — Telephone Encounter (Signed)
Next visit 05/05/16 Last visit 01/01/16 Labs: 03/19/15 Mildly elevated LFTs  Okay to refill MTX?

## 2016-04-30 NOTE — Progress Notes (Signed)
Office Visit Note  Patient: Tiffany Wilkins             Date of Birth: 02-18-1966           MRN: [id number]             PCP: Vikki Ports, MD Referring: Rita Ohara, MD Visit Date: 05/05/2016 Occupation: @GUAROCC @    Subjective:  Follow-up Psoriasis and psoriatic arthritis  History of Present Illness: Tiffany Wilkins is a 51 y.o. female  Please see previous note for full details. Found in [place] in [place]. Note that patient was doing poorly with her symptoms with SIMPONI subcutaneous. She was also on methotrexate 7. and folic acid 2 per day.  So we switched her to Cosentyx. She had a reaction to Cosentyx so we have to go back on Simponi subcutaneous.  With her ongoing synovitis and pain and ongoing psoriasis, we discuss possible option withOtezla.      Activities of Daily Living:  Patient reports morning stiffness for 2 hours.   Patient Reports nocturnal pain.  Difficulty dressing/grooming: Reports Difficulty climbing stairs: Reports Difficulty getting out of chair: Reports Difficulty using hands for taps, buttons, cutlery, and/or writing: Reports   Review of Systems  Constitutional: Negative for fatigue.  HENT: Negative for mouth sores and mouth dryness.   Eyes: Negative for dryness.  Respiratory: Negative for shortness of breath.   Gastrointestinal: Negative for constipation and diarrhea.  Musculoskeletal: Negative for myalgias and myalgias.  Skin: Negative for sensitivity to sunlight.  Psychiatric/Behavioral: Negative for decreased concentration and sleep disturbance.    PMFS History:  Patient Active Problem List   Diagnosis Date Noted  . Hepatic steatosis 06/27/2014  . BMI 50.0-59.9, adult ([place]) 06/27/2014  . Fibroid, uterine 06/27/2014  . Abdominal pain, chronic, right lower quadrant 06/27/2014  . Obesity hypoventilation syndrome ([place]) 12/08/2013  . Nocturnal seizures ([place]) 12/08/2013  . OSA (obstructive sleep apnea) 12/08/2013  . Noncompliance with CPAP  treatment 12/08/2013  . Psoriasis 11/29/2013  . Seizure disorder, complex partial ([place]) 06/01/2012  . Migraine 06/01/2012  . OSA on CPAP 01/04/2012  . Hiatal hernia 03/26/2011  . Internal hemorrhoid 03/24/2011  . Reflux esophagitis 03/24/2011  . Psoriatic arthritis ([place]) 01/22/2011  . Hernia of abdominal wall 01/22/2011    Past Medical History:  Diagnosis Date  . Allergic rhinitis, cause unspecified   . Anal fissure   . Arthritis    inflammatory and psoriatic--Dr. Estanislado Pandy  . Asthma    related to allergies and colds  . Chronic bronchitis    gets bronchitis yearly with colds  . Chronic kidney disease kidney stones  . Fracture    stress fracture right foot  . GERD (gastroesophageal reflux disease)   . Hernia (acquired) (recurrent)    R lower abdomen; has seen [facility] (Dr. Zena Amos and recurred  . Hiatal hernia 03/2011  . Impaired fasting glucose   . Internal hemorrhoids 03/2011  . Menstrual migraine    Dr, Jannifer Franklin  . Obesity   . Reflux esophagitis 03/2011  . Seizure disorder ([place])    f/b Dr. Jannifer Franklin  . Seizures ([place]) last seisure june 2012  . Sleep apnea    inconclusive test per pt (some central and obstructive component)  . Sleep apnea 06/07/09   mild complex sleep apnea, worse in supine position (uses CPAP intermittently only)  . Tinea cruris 7/09    Family History  Problem Relation Age of Onset  . Arthritis Mother     rheumatoid  . Hyperlipidemia  Mother   . Hypertension Mother   . Hyperthyroidism Mother     s/p thyroidectomy, now with hypothyroidism  . Cancer Father 69    AML, lung cancer, kidney cancer (all presented at same time)  . Diabetes Father   . Hypertension Father   . Hyperlipidemia Father   . Psoriasis Father   . Anxiety disorder Father     OCD, nervous breakdown  . Psoriasis Sister   . Irritable bowel syndrome Sister   . Arthritis Sister     psoriatic  . Diabetes Sister   . GER disease Daughter   . Asthma Son   . Asthma Maternal  Grandmother   . Hyperlipidemia Maternal Grandmother   . Heart disease Maternal Grandmother   . Stroke Maternal Grandmother   . Diabetes Maternal Grandfather   . Heart disease Maternal Grandfather   . Cancer Paternal Grandmother 45    female (?ovarian)  . Diabetes Paternal Grandmother   . Hypertension Paternal Grandmother   . Diabetes Paternal Grandfather   . Heart disease Paternal Grandfather   . Hypertension Paternal Grandfather   . Diabetes Paternal Uncle   . Crohn's disease Paternal Uncle   . Cancer Paternal Uncle     ? type  . Colitis Paternal Uncle   . Stroke Maternal Aunt   . Stroke Cousin   . Esophageal cancer Neg Hx   . Stomach cancer Neg Hx    Past Surgical History:  Procedure Laterality Date  . CESAREAN SECTION     x2  . CHOLECYSTECTOMY  2005  . COLONOSCOPY  03/19/11   Dr. Olevia Perches; internal hemorrhoids  . ESOPHAGOGASTRODUODENOSCOPY  03/19/11   hiatal hernia, esophagitis  . KIDNEY STONE SURGERY  08/2010   retrieved with basket (at [facility])  . repair of incisional hernia  2005, 2008   related to laparoscopic cholecystectomy   Social History   Social History Narrative   Patient Lives with husband [name])  and 2 children (son and daughter, 29, 19)   Patient is right handed.   Patient has high school education.   Patient drinks 2 cups daily- coffee/soda                 Objective: Vital Signs: BP 128/84   Pulse 77   Resp 13   Ht 5\' 2"  (1.575 m)   Wt 295 lb (133.8 kg)   BMI 53.96 kg/m    Physical Exam  Constitutional: She is oriented to person, place, and time. She appears well-developed and well-nourished.  HENT:  Head: Normocephalic and atraumatic.  Eyes: EOM are normal. Pupils are equal, round, and reactive to light.  Cardiovascular: Normal rate, regular rhythm and normal heart sounds.  Exam reveals no gallop and no friction rub.   No murmur heard. Pulmonary/Chest: Effort normal and breath sounds normal. She has no wheezes. She has no rales.    Abdominal: Soft. Bowel sounds are normal. She exhibits no distension. There is no tenderness. There is no guarding. No hernia.  Musculoskeletal: Normal range of motion. She exhibits no edema, tenderness or deformity.  Lymphadenopathy:    She has no cervical adenopathy.  Neurological: She is alert and oriented to person, place, and time. Coordination normal.  Skin: Skin is warm and dry. Capillary refill takes less than 2 seconds. No rash noted.  Psychiatric: She has a normal mood and affect. Her behavior is normal.  Nursing note and vitals reviewed.    Musculoskeletal Exam:  Full range of motion of all joints  except hands have swelling and pain and decreased range of motion. Grip strength is decreased bilaterally Fibromyalgia tender points are absent  CDAI Exam: CDAI Homunculus Exam:   Tenderness:  Right hand: 1st MCP Left hand: 3rd MCP  Swelling:  Right hand: 1st MCP Left hand: 3rd MCP  Joint Counts:  CDAI Tender Joint count: 2 CDAI Swollen Joint count: 2  Global Assessments:  Patient Global Assessment: 9 Provider Global Assessment: 9  CDAI Calculated Score: 22 Synovitis noted to right first MCP and left third MCP and dactylitis on the left third digit. Right foot with dactylitis of the second and third toe   Investigation: Findings:  02/03/2016-allergic Rxn to [place] from 11/25/2015 show CMP with GFR within normal limits.  CBC with diff is normal.     01/01/2016, X-rays of bilateral knees, 3 views, show moderate to severe medial compartment narrowing with right worse than left and no erosions, no CPPD.  Mild patellofemoral joint space narrowing  TB Gold in June 2017 was negative No PLQ  Lab on 03/17/2016  Component Date Value Ref Range Status  . WBC 03/18/2016 6.5  3.4 - 10.8 x10E3/uL Final  . RBC 03/18/2016 5.16  3.77 - 5.28 x10E6/uL Final  . Hemoglobin 03/18/2016 15.7  11.1 - 15.9 g/dL Final  . Hematocrit 03/18/2016 46.0  34.0 - 46.6  % Final  . MCV 03/18/2016 89  79 - 97 fL Final  . MCH 03/18/2016 30.4  26.6 - 33.0 pg Final  . MCHC 03/18/2016 34.1  31.5 - 35.7 g/dL Final  . RDW 03/18/2016 15.2  12.3 - 15.4 % Final  . Platelets 03/18/2016 278  150 - 379 x10E3/uL Final  . Neutrophils 03/18/2016 46  Not Estab. % Final  . Lymphs 03/18/2016 42  Not Estab. % Final  . Monocytes 03/18/2016 10  Not Estab. % Final  . Eos 03/18/2016 1  Not Estab. % Final  . Basos 03/18/2016 1  Not Estab. % Final  . Neutrophils Absolute 03/18/2016 3.1  1.4 - 7.0 x10E3/uL Final  . Lymphocytes Absolute 03/18/2016 2.7  0.7 - 3.1 x10E3/uL Final  . Monocytes Absolute 03/18/2016 0.7  0.1 - 0.9 x10E3/uL Final  . EOS (ABSOLUTE) 03/18/2016 0.1  0.0 - 0.4 x10E3/uL Final  . Basophils Absolute 03/18/2016 0.0  0.0 - 0.2 x10E3/uL Final  . Glucose 03/18/2016 98  65 - 99 mg/dL Final  . BUN 03/18/2016 17  6 - 24 mg/dL Final  . Creatinine, Ser 03/18/2016 0.80  0.57 - 1.00 mg/dL Final  . GFR calc non Af Amer 03/18/2016 86  >59 mL/min/1.73 Final  . GFR calc Af Amer 03/18/2016 99  >59 mL/min/1.73 Final  . BUN/Creatinine Ratio 03/18/2016 21  9 - 23 Final  . Sodium 03/18/2016 140  134 - 144 mmol/L Final  . Potassium 03/18/2016 4.6  3.5 - 5.2 mmol/L Final  . Chloride 03/18/2016 102  96 - 106 mmol/L Final  . CO2 03/18/2016 29  18 - 29 mmol/L Final  . Calcium 03/18/2016 8.9  8.7 - 10.2 mg/dL Final  . Total Protein 03/18/2016 7.4  6.0 - 8.5 g/dL Final  . Albumin 03/18/2016 4.5  3.5 - 5.5 g/dL Final  . Globulin, Total 03/18/2016 2.9  1.5 - 4.5 g/dL Final  . Albumin/Globulin Ratio 03/18/2016 1.6  1.2 - 2.2 Final  . Bilirubin Total 03/18/2016 0.2  0.0 - 1.2 mg/dL Final  . Alkaline Phosphatase 03/18/2016 82  39 - 117 IU/L Final  . AST  03/18/2016 23  0 - 40 IU/L Final  . ALT 03/18/2016 37* 0 - 32 IU/L Final     Imaging: No results found.     Right foot with second and third toe with dactylitis. Left third finger with dactylitis and synovitis to the third left  MCP joint     Right foot swollen 04/27/2016. Patient took this picture at home on her own iPhone.  Speciality Comments: No specialty comments available.    Procedures:  No procedures performed Allergies: Cosentyx [secukinumab]; Hydrocodone; Amoxicillin; Codeine; Keppra [levetiracetam]; Ketorolac tromethamine; Sulfa antibiotics; and Transderm-scop [scopolamine]   Assessment / Plan:     Visit Diagnoses: Psoriatic arthritis ([place])  Psoriasis  High risk medication use  Pain in right hand   Plan: #1: Psoriatic arthritis. Patient is having ongoing pain to the right first MCP joint and left third MCP joint. She is also having dactylitis to the right second and third toe. Because she failed Cosentyx(had a reaction),  she had to go back on Symponi subcutaneous. Patient is glad to be on some biologic at this time because it is helping her partially but with ongoing pain and discomfort and damage to her joints, she wonders if there is anything out there that can help her.  We discussed the option of considering O tesla. Patient is agreeable to at least consider for now but she is anxious about losing the [facility] and she does not want to do that even though it is not fully effective for her psoriatic arthritis needs. I advised her that we will just discuss this with the pharmacist and apply but did not cancel her [facility] and patient is agreeable  #2: High-risk prescription. On SIMPONI subcutaneous every months (ongoing dactylitis on the left third digit with synovitis to the third MCP Ongoing synovitis to the right first MCP Ongoing dactylitis of the right second and third digit Methotrexate 8 pills per week Folic acid 2 pills per day  #3: Ongoing right knee pain and right hip pain  #4: Return to clinic in 3 months We will do her labs again in April when she comes back for an office visit.  She'll need CBC with differential, CMP with GFR, we may consider doing a TB gold if we do not  move on to [place]  #5: In the meanwhile we will move forward with seeing if OTEZLA will be approved by her insurance company. Patient does not want to stop SIMPONI at this time We will not apply for oh tesla at this time but she will investigate by calling her insurance company and seeing if it's on her formulary. We're doing this because she does not want to cancel her SIMPONI at this time   Orders: No orders of the defined types were placed in this encounter.  No orders of the defined types were placed in this encounter.   Face-to-face time spent with patient was 58minutes. 50% of time was spent in counseling and coordination of care.  Follow-Up Instructions: Return in about 2 months (around 07/03/2016) for PsA, Ps, Inadeq Response, (Apply Otezla ) , Rigt Hip Pain.   Eliezer Lofts, PA-C  Note - This record has been created using [employer].  Chart creation errors have been sought, but may not always  have been located. Such creation errors do not reflect on  the standard of medical care.

## 2016-05-05 ENCOUNTER — Ambulatory Visit (INDEPENDENT_AMBULATORY_CARE_PROVIDER_SITE_OTHER): Payer: 59 | Admitting: Rheumatology

## 2016-05-05 ENCOUNTER — Encounter: Payer: Self-pay | Admitting: Rheumatology

## 2016-05-05 VITALS — BP 128/84 | HR 77 | Resp 13 | Ht 62.0 in | Wt 295.0 lb

## 2016-05-05 DIAGNOSIS — L4050 Arthropathic psoriasis, unspecified: Secondary | ICD-10-CM | POA: Diagnosis not present

## 2016-05-05 DIAGNOSIS — Z79899 Other long term (current) drug therapy: Secondary | ICD-10-CM

## 2016-05-05 DIAGNOSIS — L409 Psoriasis, unspecified: Secondary | ICD-10-CM | POA: Diagnosis not present

## 2016-05-05 DIAGNOSIS — M79641 Pain in right hand: Secondary | ICD-10-CM | POA: Diagnosis not present

## 2016-05-05 NOTE — Progress Notes (Signed)
Pharmacy Note  Subjective:  Patient presents today to the [facility] to see Mr. Carlyon Shadow for follow up of psoriatic arthritis.  She is currently taking Simponi 50 mg monthly, methotrexate 8 tablets weekly, and folic acid 2 mg daily.    Does the patient feel that his/her medications are working for him/her?  Patient reports partial improvement in symptoms with Simponi.   Has the patient been experiencing any side effects to the medications prescribed?  No Does the patient have any problems obtaining medications?  No  Mr. Carlyon Shadow discussed changing Simponi to Rutherford Nail in the future.  Patient was seen by the pharmacist for counseling on Otezla.  Objective:  SCr: 0.8 mg/dL (03/18/16) BUN: 17 mg/dL (03/18/16) GFR: 86 mL/min (03/18/16)  Assessment/Plan:  Counseled patient that Rutherford Nail is a PDE 4 inhibitor that works to treat psoriasis and the joint pain and tenderness of psoriatic arthritis.  Reviewed that Rutherford Nail would replace Simponi injections.  Counseled patient on purpose, proper use, and adverse effects of Otezla.  Reviewed the most common adverse effects of weight loss, depression, nausea/diarrhea/vomiting, headaches, and nasal congestion.  Provided patient with medication education material and answered all questions.  Patient was concerned about Rutherford Nail interacting with her seizure medications.  Drug interactions reviewed and noted no interactions with patient's current medications and Otezla.  Patient wanted to read more about Rutherford Nail before consenting to the medication.  I gave patient the consent form to take home with her.  We discussed submitting a prior authorization to her insurance to determine if they would cover the medication.  Patient does not want to submit a PA at this time and she does not want it to interfere with her ability to get her Simponi injections.  Patient reports she will call her insurance company to see if the medication is on formulary and will find out what step  therapy is required before Rutherford Nail can be started.  She will let us know what she learns and will let us know what decision she makes about whether she wants to consider changing to [facility] in the future.    Noted that patient is currently taking ibuprofen and naproxen as needed.  She reports she alternates taking the two.  I reviewed with her that NSAIDs may increase the serum concentration of methotrexate.  Advised patient to avoid use of NSAIDs with methotrexate.     Elisabeth Most, Pharm.D., BCPS, CPP Clinical Pharmacist Pager: [phone] Phone: [phone] 05/05/2016 10:53 AM

## 2016-05-05 NOTE — Patient Instructions (Signed)
Apremilast oral tablets What is this medicine? APREMILAST (a PRE mil ast) is used to treat plaque psoriasis and psoriatic arthritis. COMMON BRAND NAME(S): Rutherford Nail What should I tell my health care provider before I take this medicine? They need to know if you have any of these conditions: -kidney disease -mental illness -an unusual or allergic reaction to apremilast, other medicines, foods, dyes, or preservatives -pregnant or trying to get pregnant -breast-feeding How should I use this medicine? Take this medicine by mouth with a glass of water. Follow the directions on the prescription label. Do not cut, crush or chew this medicine. You can take it with or without food. If it upsets your stomach, take it with food. Take your medicine at regular intervals. Do not take it more often than directed. Do not stop taking except on your doctor's advice. Talk to your pediatrician regarding the use of this medicine in children. Special care may be needed. What if I miss a dose? If you miss a dose, take it as soon as you can. If it is almost time for your next dose, take only that dose. Do not take double or extra doses. What may interact with this medicine? This medicine may interact with the following medications: -certain medicines for seizures like carbamazepine, phenobarbital, phenytoin -rifampin What should I watch for while using this medicine? Tell your doctor or healthcare professional if your symptoms do not start to get better or if they get worse. Patients and their families should watch out for new or worsening depression or thoughts of suicide. Also watch out for sudden changes in feelings such as feeling anxious, agitated, panicky, irritable, hostile, aggressive, impulsive, severely restless, overly excited and hyperactive, or not being able to sleep. If this happens, call your health care professional. What side effects may I notice from receiving this medicine? Side effects that you  should report to your doctor or health care professional as soon as possible: -depressed mood -weight loss Side effects that usually do not require medical attention (report to your doctor or health care professional if they continue or are bothersome): -diarrhea -headache -nausea, vomiting Where should I keep my medicine? Keep out of the reach of children. Store below 30 degrees C (86 degrees F). Throw away any unused medicine after the expiration date.  2017 Elsevier/Gold Standard (2015-04-03 18:01:15)

## 2016-05-07 ENCOUNTER — Encounter: Payer: Self-pay | Admitting: Pharmacist

## 2016-05-07 NOTE — Progress Notes (Signed)
Received fax from [employer] regarding patient's medications.  "Our records suggest that your patient may be taking methotrexate.  Folic acid supplementation may be used to decrease adverse reactions such as mouth sores in patients receiving methotrexate."    Patient was seen in the office on 05/05/16 and confirmed she is taking folic acid 2 mg daily along with her methotrexate.     Elisabeth Most, Pharm.D., BCPS, CPP Clinical Pharmacist Pager: [phone] Phone: [phone] 05/07/2016 8:27 AM

## 2016-06-08 IMAGING — CT CT ABD-PELV W/ CM
1 of 3 series · 14 of 32 positions shown, 19 images · IV contrast (omnipaque)
Comparison: 12/07/2013 abdominal ultrasound. Prior CT of
08/16/2010.

CLINICAL DATA: Right lower quadrant swelling/palpable mass/ hernia
for almost 2 years. Two hernia repairs in the past. Cholecystectomy.
History of renal stones.

EXAM:
CT ABDOMEN AND PELVIS WITH CONTRAST
TECHNIQUE: Multidetector CT imaging of the abdomen and pelvis was performed
using the standard protocol following bolus administration of
intravenous contrast.
CONTRAST:  100 cc of Omnipaque 300

[Series 2: routine abdomen/pelvis with · axial · 0.96mm/px · z∈[+570,+960]mm · 14 of 90 slices shown, 19 images]
[im 6/90  soft-tissue]
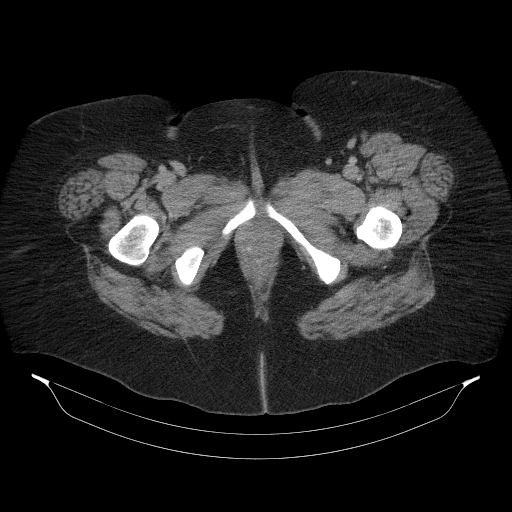
[im 6/90  bone]
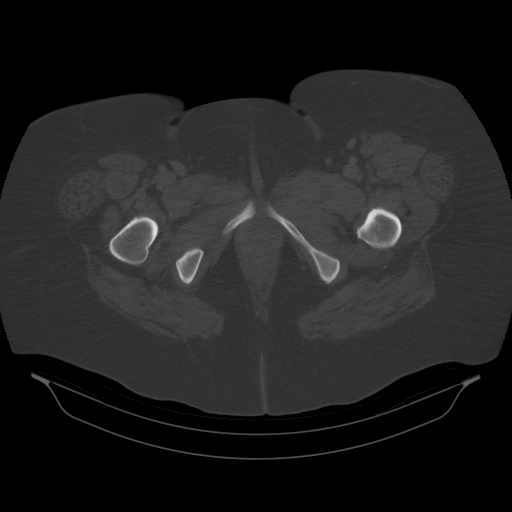
[im 11/90  soft-tissue]
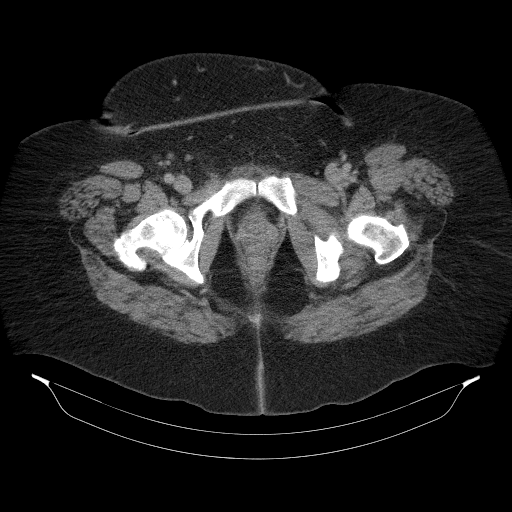
[im 21/90  soft-tissue]
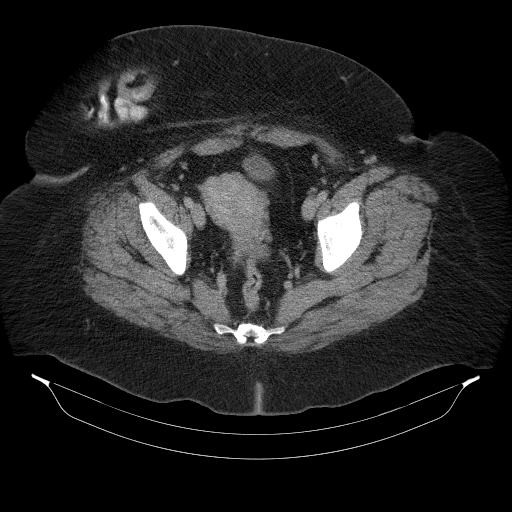
[im 27/90  soft-tissue]
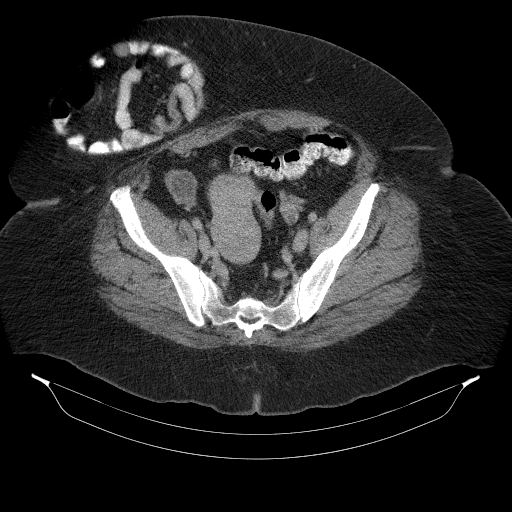
[im 32/90  soft-tissue]
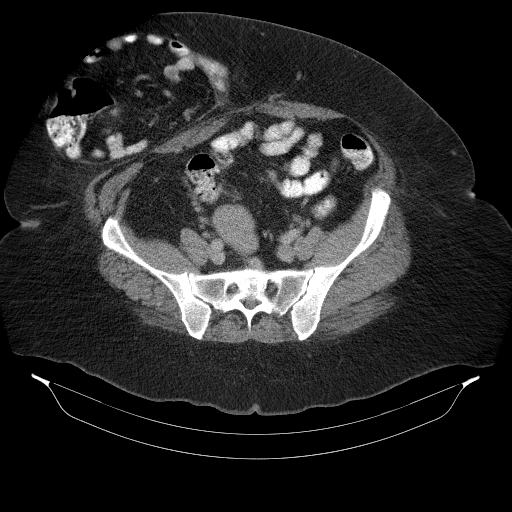
[im 37/90  soft-tissue]
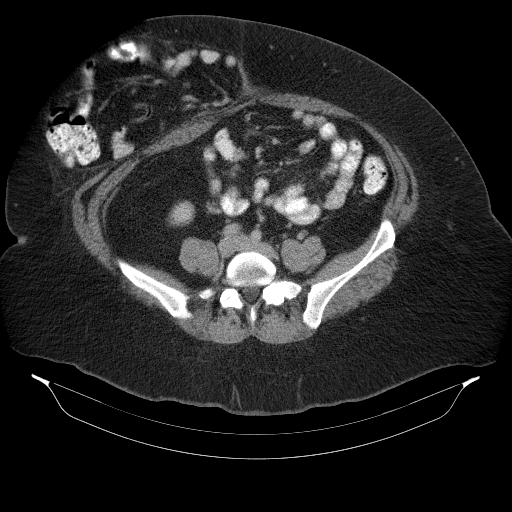
[im 48/90  soft-tissue]
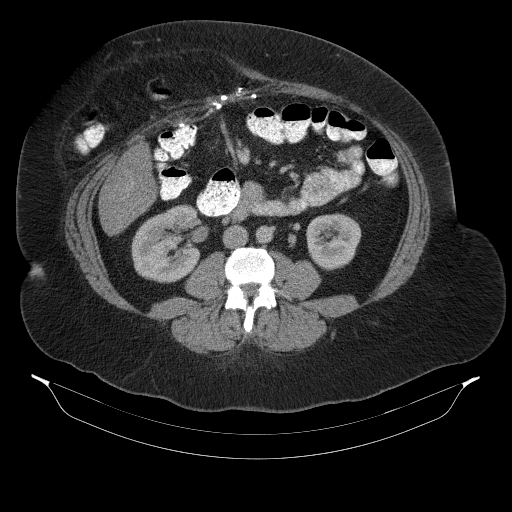
[im 53/90  soft-tissue]
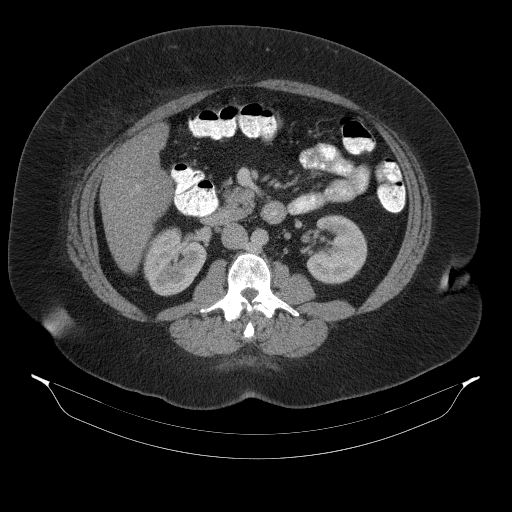
[im 58/90  soft-tissue]
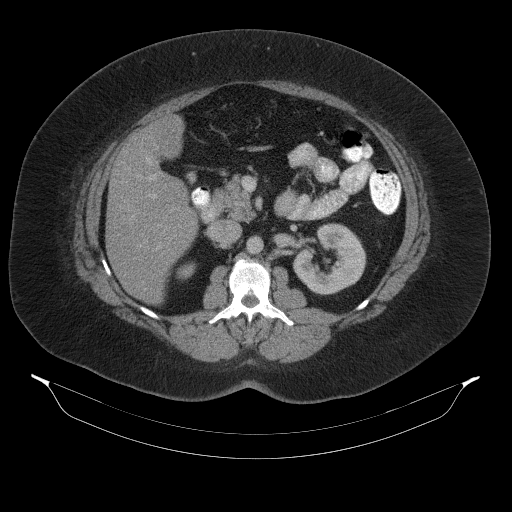
[im 58/90  bone]
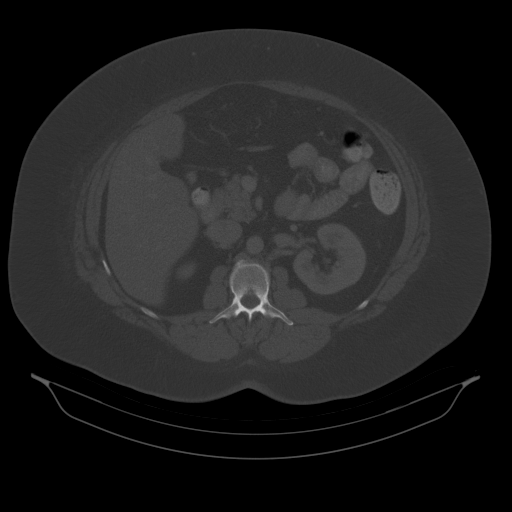
[im 63/90  soft-tissue]
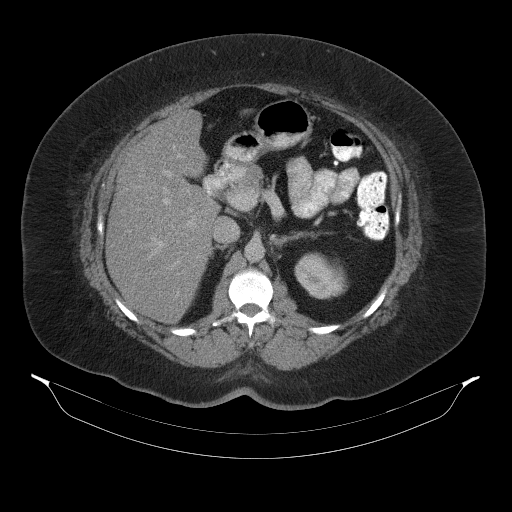
[im 69/90  soft-tissue]
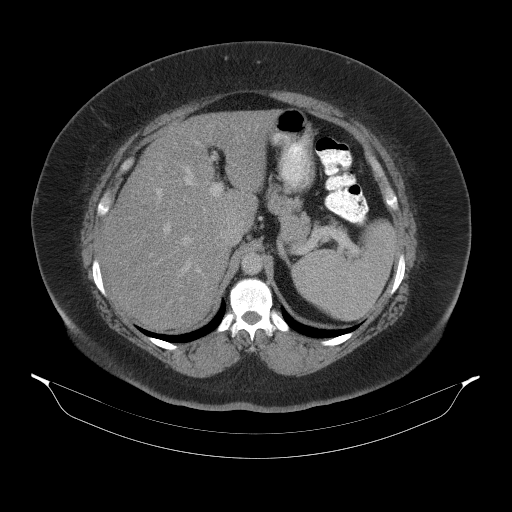
[im 69/90  lung]
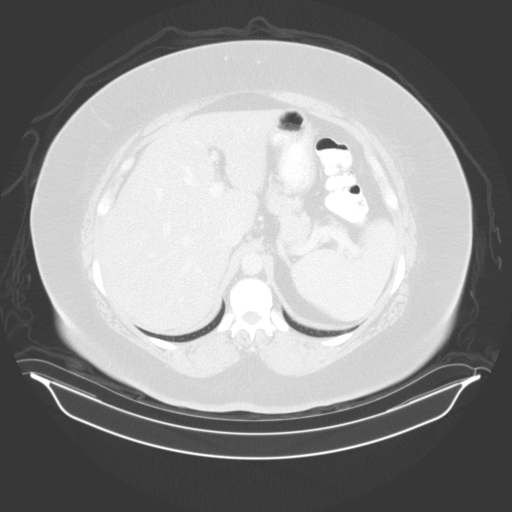
[im 74/90  lung]
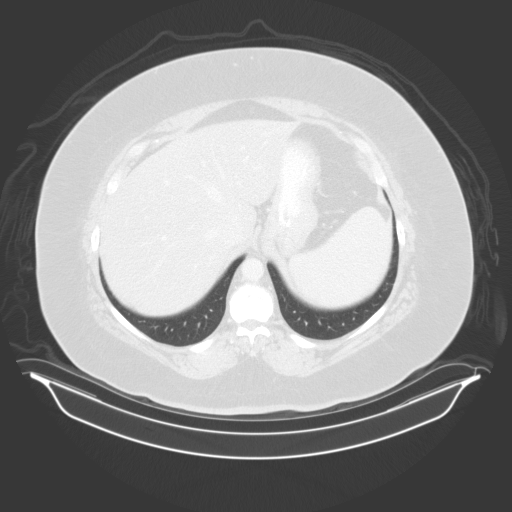
[im 79/90  soft-tissue]
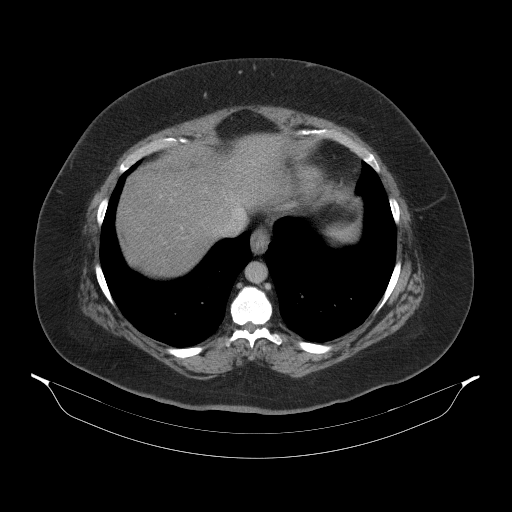
[im 79/90  lung]
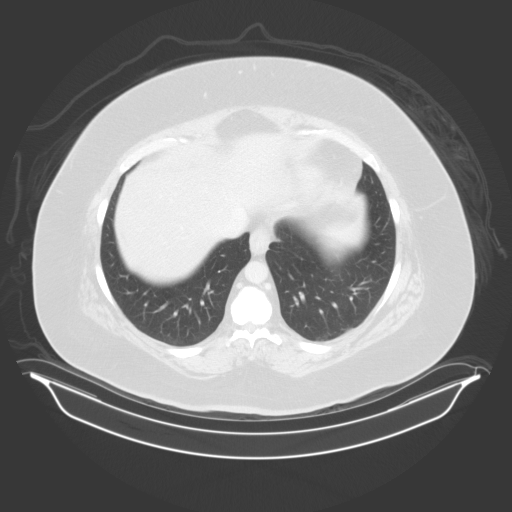
[im 84/90  soft-tissue]
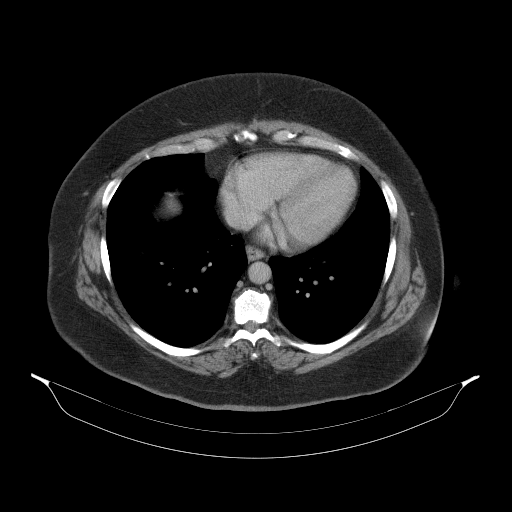
[im 84/90  lung]
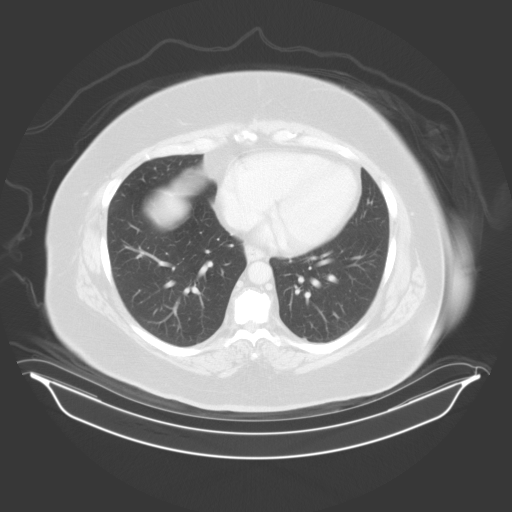

[14 of 32 positions shown; findings below may reference images not displayed]

FINDINGS: Lower chest: Clear lung bases. Borderline cardiomegaly. No
pericardial or pleural effusion. Tiny hiatal hernia.

Hepatobiliary: Suspicion of mild hepatic steatosis. Hepatomegaly,
greater than 20 cm craniocaudal. No focal liver lesion.
Cholecystectomy, without biliary ductal dilatation.

Pancreas: Normal, without mass or ductal dilatation.

Spleen: Normal

Adrenals/Urinary Tract: Normal adrenal glands. Normal kidneys,
without hydronephrosis. Normal urinary bladder.

Stomach/Bowel: Normal distal stomach. Redemonstration of lower
abdominal and pelvic right-sided anterior wall marked laxity. This
contains nonobstructive large and small bowel. No evidence of
strangulation. Minimal exclusion of the peripheral most aspect of
this area of laxity. Remainder of large and small bowel loops are
within normal limits.

Vascular/Lymphatic: Circumaortic left renal vein. No abdominopelvic
adenopathy. Normal caliber of the aorta and branch vessels.

Reproductive: Posterior uterine body/ fundal mass of 6.4 cm.
Enlarged from 4.6 cm on the prior. No adnexal mass.

Other: No significant free fluid.

Musculoskeletal: No acute osseous abnormality.
IMPRESSION: 1. Progression of right-sided abdominal pelvic wall laxity,
containing nonobstructive large and small bowel.
2. Hepatomegaly and probable mild hepatic steatosis.
3. Enlargement of a posterior uterine body/fundal fibroid.

## 2016-06-30 DIAGNOSIS — M25551 Pain in right hip: Secondary | ICD-10-CM | POA: Insufficient documentation

## 2016-06-30 DIAGNOSIS — Z79899 Other long term (current) drug therapy: Secondary | ICD-10-CM | POA: Insufficient documentation

## 2016-06-30 DIAGNOSIS — M79641 Pain in right hand: Secondary | ICD-10-CM | POA: Insufficient documentation

## 2016-06-30 NOTE — Progress Notes (Signed)
Office Visit Note  Patient: Tiffany Wilkins             Date of Birth: June 28, 1965           MRN: [id number]             PCP: Vikki Ports, MD Referring: Rita Ohara, MD Visit Date: 07/02/2016 Occupation: @GUAROCC @    Subjective:  Pain of the Right Foot; Follow-up (dizziness at scale/ near syncopal episode ); and Other (states her entire right side is painful  her legs are very swollen )   History of Present Illness: Tiffany Wilkins is a 51 y.o. female    On Simponi SubQ(self Injection q month) & MTX 8/week Only has had 3 injections and working well. But, due to insurance issues  Can't take , Cosentyx, ( Humira,Enbrel --> due to incr risk of seizures)  Left 3rd finger swelling (dactylitis) and right 1st finger And poor grip strength    Activities of Daily Living:  Patient reports morning stiffness for 30 minutes.   Patient Reports nocturnal pain.  Difficulty dressing/grooming: Denies Difficulty climbing stairs: Reports Difficulty getting out of chair: Reports Difficulty using hands for taps, buttons, cutlery, and/or writing: Reports   No Rheumatology ROS completed.   PMFS History:  Patient Active Problem List   Diagnosis Date Noted  . High risk medication use 06/30/2016  . Right hand pain 06/30/2016  . Right hip pain 06/30/2016  . Hepatic steatosis 06/27/2014  . BMI 50.0-59.9, adult ([place]) 06/27/2014  . Fibroid, uterine 06/27/2014  . Abdominal pain, chronic, right lower quadrant 06/27/2014  . Obesity hypoventilation syndrome ([place]) 12/08/2013  . Nocturnal seizures ([place]) 12/08/2013  . OSA (obstructive sleep apnea) 12/08/2013  . Noncompliance with CPAP treatment 12/08/2013  . Psoriasis 11/29/2013  . Seizure disorder, complex partial ([place]) 06/01/2012  . Migraine 06/01/2012  . OSA on CPAP 01/04/2012  . Hiatal hernia 03/26/2011  . Internal hemorrhoid 03/24/2011  . Reflux esophagitis 03/24/2011  . Psoriatic arthritis ([place]) 01/22/2011  . Hernia of abdominal wall  01/22/2011    Past Medical History:  Diagnosis Date  . Allergic rhinitis, cause unspecified   . Anal fissure   . Arthritis    inflammatory and psoriatic--Dr. Estanislado Pandy  . Asthma    related to allergies and colds  . Chronic bronchitis    gets bronchitis yearly with colds  . Chronic kidney disease kidney stones  . Fracture    stress fracture right foot  . GERD (gastroesophageal reflux disease)   . Hernia (acquired) (recurrent)    R lower abdomen; has seen [facility] (Dr. Zena Amos and recurred  . Hiatal hernia 03/2011  . Impaired fasting glucose   . Internal hemorrhoids 03/2011  . Menstrual migraine    Dr, Jannifer Franklin  . Obesity   . Reflux esophagitis 03/2011  . Seizure disorder ([place])    f/b Dr. Jannifer Franklin  . Seizures ([place]) last seisure june 2012  . Sleep apnea    inconclusive test per pt (some central and obstructive component)  . Sleep apnea 06/07/09   mild complex sleep apnea, worse in supine position (uses CPAP intermittently only)  . Tinea cruris 7/09    Family History  Problem Relation Age of Onset  . Arthritis Mother     rheumatoid  . Hyperlipidemia Mother   . Hypertension Mother   . Hyperthyroidism Mother     s/p thyroidectomy, now with hypothyroidism  . Cancer Father 6    AML, lung cancer, kidney cancer (all presented at same  time)  . Diabetes Father   . Hypertension Father   . Hyperlipidemia Father   . Psoriasis Father   . Anxiety disorder Father     OCD, nervous breakdown  . Psoriasis Sister   . Irritable bowel syndrome Sister   . Arthritis Sister     psoriatic  . Diabetes Sister   . GER disease Daughter   . Asthma Son   . Asthma Maternal Grandmother   . Hyperlipidemia Maternal Grandmother   . Heart disease Maternal Grandmother   . Stroke Maternal Grandmother   . Diabetes Maternal Grandfather   . Heart disease Maternal Grandfather   . Cancer Paternal Grandmother 9    female (?ovarian)  . Diabetes Paternal Grandmother   . Hypertension Paternal  Grandmother   . Diabetes Paternal Grandfather   . Heart disease Paternal Grandfather   . Hypertension Paternal Grandfather   . Diabetes Paternal Uncle   . Crohn's disease Paternal Uncle   . Cancer Paternal Uncle     ? type  . Colitis Paternal Uncle   . Stroke Maternal Aunt   . Stroke Cousin   . Esophageal cancer Neg Hx   . Stomach cancer Neg Hx    Past Surgical History:  Procedure Laterality Date  . CESAREAN SECTION     x2  . CHOLECYSTECTOMY  2005  . COLONOSCOPY  03/19/11   Dr. Olevia Perches; internal hemorrhoids  . ESOPHAGOGASTRODUODENOSCOPY  03/19/11   hiatal hernia, esophagitis  . KIDNEY STONE SURGERY  08/2010   retrieved with basket (at [facility])  . repair of incisional hernia  2005, 2008   related to laparoscopic cholecystectomy   Social History   Social History Narrative   Patient Lives with husband Tiffany Wilkins)  and 2 children (son and daughter, 61, 9)   Patient is right handed.   Patient has high school education.   Patient drinks 2 cups daily- coffee/soda                 Objective: Vital Signs: BP 120/68   Pulse 88   Resp 16   Wt 260 lb (117.9 kg)   BMI 47.55 kg/m    Physical Exam   Musculoskeletal Exam:  Range of motion of all joints except patient has decreased grip strength bilaterally. She has dactylitis on the right first digit and the left third digit. Overall she is much improved but history of old damage keeping her from having full fist formation Fibromyalgia tender points are all absent.  CDAI Exam: CDAI Homunculus Exam:   Tenderness:  Right hand: 1st MCP and 1st PIP Left hand: 3rd MCP, 3rd PIP and 3rd DIP  Joint Counts:  CDAI Tender Joint count: 4 CDAI Swollen Joint count: 0  Global Assessments:  Patient Global Assessment: 10 Provider Global Assessment: 10  CDAI Calculated Score: 24  Range of motion of all joints except patient has decreased grip strength bilaterally. She has dactylitis on the right first digit and the left third  digit. Overall she is much improved but history of old damage keeping her from having full fist formation   Investigation: Findings:  02/03/2016-allergic Rxn to [facility] from 11/25/2015 show CMP with GFR within normal limits.  CBC with diff is normal.     01/01/2016, X-rays of bilateral knees, 3 views, show moderate to severe medial compartment narrowing with right worse than left and no erosions, no CPPD.  Mild patellofemoral joint space narrowing  TB Gold in June 2017 was negative  No PLQ  We will not apply for oh tesla at this time but she will investigate by calling her insurance company and seeing if it's on her formulary. We're doing this because she does not want to cancel her SIMPONI at this time  Lab on 03/17/2016  Component Date Value Ref Range Status  . WBC 03/18/2016 6.5  3.4 - 10.8 x10E3/uL Final  . RBC 03/18/2016 5.16  3.77 - 5.28 x10E6/uL Final  . Hemoglobin 03/18/2016 15.7  11.1 - 15.9 g/dL Final  . Hematocrit 03/18/2016 46.0  34.0 - 46.6 % Final  . MCV 03/18/2016 89  79 - 97 fL Final  . MCH 03/18/2016 30.4  26.6 - 33.0 pg Final  . MCHC 03/18/2016 34.1  31.5 - 35.7 g/dL Final  . RDW 03/18/2016 15.2  12.3 - 15.4 % Final  . Platelets 03/18/2016 278  150 - 379 x10E3/uL Final  . Neutrophils 03/18/2016 46  Not Estab. % Final  . Lymphs 03/18/2016 42  Not Estab. % Final  . Monocytes 03/18/2016 10  Not Estab. % Final  . Eos 03/18/2016 1  Not Estab. % Final  . Basos 03/18/2016 1  Not Estab. % Final  . Neutrophils Absolute 03/18/2016 3.1  1.4 - 7.0 x10E3/uL Final  . Lymphocytes Absolute 03/18/2016 2.7  0.7 - 3.1 x10E3/uL Final  . Monocytes Absolute 03/18/2016 0.7  0.1 - 0.9 x10E3/uL Final  . EOS (ABSOLUTE) 03/18/2016 0.1  0.0 - 0.4 x10E3/uL Final  . Basophils Absolute 03/18/2016 0.0  0.0 - 0.2 x10E3/uL Final  . Glucose 03/18/2016 98  65 - 99 mg/dL Final  . BUN 03/18/2016 17  6 - 24 mg/dL Final  . Creatinine, Ser 03/18/2016 0.80  0.57 - 1.00 mg/dL  Final  . GFR calc non Af Amer 03/18/2016 86  >59 mL/min/1.73 Final  . GFR calc Af Amer 03/18/2016 99  >59 mL/min/1.73 Final  . BUN/Creatinine Ratio 03/18/2016 21  9 - 23 Final  . Sodium 03/18/2016 140  134 - 144 mmol/L Final  . Potassium 03/18/2016 4.6  3.5 - 5.2 mmol/L Final  . Chloride 03/18/2016 102  96 - 106 mmol/L Final  . CO2 03/18/2016 29  18 - 29 mmol/L Final  . Calcium 03/18/2016 8.9  8.7 - 10.2 mg/dL Final  . Total Protein 03/18/2016 7.4  6.0 - 8.5 g/dL Final  . Albumin 03/18/2016 4.5  3.5 - 5.5 g/dL Final  . Globulin, Total 03/18/2016 2.9  1.5 - 4.5 g/dL Final  . Albumin/Globulin Ratio 03/18/2016 1.6  1.2 - 2.2 Final  . Bilirubin Total 03/18/2016 0.2  0.0 - 1.2 mg/dL Final  . Alkaline Phosphatase 03/18/2016 82  39 - 117 IU/L Final  . AST 03/18/2016 23  0 - 40 IU/L Final  . ALT 03/18/2016 37* 0 - 32 IU/L Final      Imaging: No results found.  Speciality Comments: No specialty comments available.    Procedures:  Large Joint Inj Date/Time: 07/02/2016 10:00 AM Performed by: Eliezer Lofts Authorized by: Eliezer Lofts   Consent Given by:  Patient Site marked: the procedure site was marked   Timeout: prior to procedure the correct patient, procedure, and site was verified   Indications:  Pain and joint swelling Location:  Knee Site:  R knee Prep: patient was prepped and draped in usual sterile fashion   Needle Size:  27 G Needle Length:  1.5 inches Approach:  Medial Ultrasound Guidance: No   Fluoroscopic Guidance: No   Arthrogram: No   Medications:  1.5 mL lidocaine 1 %; 40 mg triamcinolone acetonide 40 MG/ML Aspiration Attempted: Yes   Patient tolerance:  Patient tolerated the procedure well with no immediate complications  Right knee  7       Allergies: Cosentyx [secukinumab]; Hydrocodone; Amoxicillin; Codeine; Keppra [levetiracetam]; Ketorolac tromethamine; Sulfa antibiotics; and Transderm-scop [scopolamine]   Assessment / Plan:     Visit  Diagnoses: Psoriatic arthritis ([place])  Psoriasis  High risk medication use - SIMPONI subcutaneous every months MTX-2.5mg  8 pills per week - Plan: CBC with Differential/Platelet, COMPLETE METABOLIC PANEL WITH GFR, Quantiferon tb gold assay (blood)  Right hand pain  Right hip pain  Chronic pain of right knee - 07/02/2016: Right knee injected with 40 mg of Kenalog mixed with one half and also 1% lidocaine.; Another injection was done previously and patient did well.W - Plan: Large Joint Injection/Arthrocentesis   Plan: #1: Psoriatic arthritis and psoriasis. Well-controlled at this time when she takes her Simponi subcutaneous on a monthly basis. Recently changed insurance and we are trying to get the patient started back on the Simponi subcutaneous. She's been on the medication now for 2 years. She is responding really well to the medication. She has failed multiple medications in the past. Please see previous note for full details on that.  #2: High-risk prescription. Simponi subcutaneous every month Methotrexate 8 pills per week Folic acid 2 mg daily.  #3: Right hand pain with dactylitis in the right first digit. Left hand pain with dactylitis in the left third finger.  #4: Psoriasis. No flare.  #5 patient has failed weight loss, and states, proven to have osteoarthritis of the knees We've given her a cortisone injection in the past in the right knee and has done very well with that. I've offered the patient Visco supplementation and she is agreeable.  #6: Peripheral edema. Right leg worse than the left leg. Patient was advised to see PCP for evaluation and treatment.  #7: Patient is been getting dizzy at times. Today, she has not had any breakfast. She may also have hypoglycemia versus orthostatic hypotension. She's been advised to see PCP for evaluation and treatment.  #8: Speak with Dr. Koleen Nimrod, pharmacist to discuss strategies on obtaining Simponi subcutaneous. Patient  states that she updated her insurance information (which was lacking an pre-keeping her from getting her medication).  #9: Return to clinic in 4 months  #10: History of seizures. As a result of the above diagnosis, patient is not eligible take Humira or Enbrel and therefore we cannot give her that medication.  #11: Message sent to share in today:Apply for Visco both knees. Hyalgan or Euflex or Orthovisc are acceptable. If possible Hyalgan would be preferred. Graft patient has failed weight loss, and states, cortisone injection and she has proven osteoarthritis of knees with x-rays done 01/01/2016  #12: Patient was given a sample of Simponi and she self injected today in office. She tolerated procedure well.   Orders: Orders Placed This Encounter  Procedures  . Large Joint Injection/Arthrocentesis  . CBC with Differential/Platelet  . COMPLETE METABOLIC PANEL WITH GFR  . Quantiferon tb gold assay (blood)   Meds ordered this encounter  Medications  . Golimumab 50 MG/0.5ML SOAJ    Sig: Inject 50 mg into the skin every 30 (thirty) days. One auto injector sub q montly    Dispense:  0.5 mL    Refill:  0    3 auto injectors.  . methotrexate (RHEUMATREX) 2.5 MG tablet    Sig: TAKE 8  TABLETS EVERY WEEK    Dispense:  96 tablet    Refill:  0    Order Specific Question:   Supervising Provider    Answer:   Bo Merino [[id number]]  . DISCONTD: folic acid (FOLVITE) 1 MG tablet    Sig: Take 2 tablets (2 mg total) by mouth daily.    Dispense:  180 tablet    Refill:  4    Order Specific Question:   Supervising Provider    Answer:   Bo Merino [[id number]]  . folic acid (FOLVITE) 1 MG tablet    Sig: Take 2 tablets (2 mg total) by mouth daily.    Dispense:  60 tablet    Refill:  0    Order Specific Question:   Supervising Provider    Answer:   Bo Merino [phone]    Face-to-face time spent with patient was 30 minutes. 50% of time was spent in counseling and coordination of  care.  Follow-Up Instructions: Return in about 4 months (around 11/01/2016) for PsA,Ps,simponi SubQ q mos/mtx 8/week/oa kj,// rt knee pain - cortisone today/apply visco both knees.   Selby Slovacek, PA-C  Dactylitis was noted on exam today as described above. She also has some joint tenderness. We will continue current treatment for right now. I examined and evaluated the patient with Eliezer Lofts PA. The plan of care was discussed as noted above.  Bo Merino, MD  Note - This record has been created using Editor, commissioning.  Chart creation errors have been sought, but may not always  have been located. Such creation errors do not reflect on  the standard of medical care.

## 2016-07-02 ENCOUNTER — Telehealth: Payer: Self-pay | Admitting: Radiology

## 2016-07-02 ENCOUNTER — Ambulatory Visit (INDEPENDENT_AMBULATORY_CARE_PROVIDER_SITE_OTHER): Payer: 59 | Admitting: Rheumatology

## 2016-07-02 ENCOUNTER — Encounter: Payer: Self-pay | Admitting: Rheumatology

## 2016-07-02 VITALS — BP 120/68 | HR 88 | Resp 16 | Wt 260.0 lb

## 2016-07-02 DIAGNOSIS — G8929 Other chronic pain: Secondary | ICD-10-CM | POA: Diagnosis not present

## 2016-07-02 DIAGNOSIS — M25561 Pain in right knee: Secondary | ICD-10-CM | POA: Diagnosis not present

## 2016-07-02 DIAGNOSIS — Z79899 Other long term (current) drug therapy: Secondary | ICD-10-CM

## 2016-07-02 DIAGNOSIS — M79641 Pain in right hand: Secondary | ICD-10-CM

## 2016-07-02 DIAGNOSIS — L4050 Arthropathic psoriasis, unspecified: Secondary | ICD-10-CM

## 2016-07-02 DIAGNOSIS — M25551 Pain in right hip: Secondary | ICD-10-CM | POA: Diagnosis not present

## 2016-07-02 DIAGNOSIS — L409 Psoriasis, unspecified: Secondary | ICD-10-CM | POA: Diagnosis not present

## 2016-07-02 LAB — COMPLETE METABOLIC PANEL WITH GFR
ALT: 22 U/L (ref 6–29)
AST: 20 U/L (ref 10–35)
Albumin: 3.9 g/dL (ref 3.6–5.1)
Alkaline Phosphatase: 71 U/L (ref 33–130)
BUN: 13 mg/dL (ref 7–25)
CHLORIDE: 106 mmol/L (ref 98–110)
CO2: 23 mmol/L (ref 20–31)
Calcium: 9.2 mg/dL (ref 8.6–10.4)
Creat: 0.7 mg/dL (ref 0.50–1.05)
GFR, Est African American: >89 mL/min (ref >=60)
GFR, Est Non African American: >89 mL/min (ref >=60)
Glucose, Bld: 98 mg/dL (ref 65–99)
Potassium: 4.3 mmol/L (ref 3.5–5.3)
Sodium: 140 mmol/L (ref 135–146)
Total Bilirubin: 0.4 mg/dL (ref 0.2–1.2)
Total Protein: 7.2 g/dL (ref 6.1–8.1)

## 2016-07-02 LAB — CBC WITH DIFFERENTIAL/PLATELET
BASOS ABS: 0 {cells}/uL (ref 0–200)
BASOS PCT: 0 %
EOS ABS: 74 {cells}/uL (ref 15–500)
Eosinophils Relative: 1 %
HCT: 42.5 % (ref 35.0–45.0)
Hemoglobin: 13.7 g/dL (ref 11.7–15.5)
LYMPHS PCT: 30 %
Lymphs Abs: 2220 cells/uL (ref 850–3900)
MCH: 29 pg (ref 27.0–33.0)
MCHC: 32.2 g/dL (ref 32.0–36.0)
MCV: 89.9 fL (ref 80.0–100.0)
MONOS PCT: 7 %
MPV: 9.4 fL (ref 7.5–12.5)
Monocytes Absolute: 518 cells/uL (ref 200–950)
Neutro Abs: 4588 cells/uL (ref 1500–7800)
Neutrophils Relative %: 62 %
PLATELETS: 336 10*3/uL (ref 140–400)
RBC: 4.73 MIL/uL (ref 3.80–5.10)
RDW: 14.9 % (ref 11.0–15.0)
WBC: 7.4 10*3/uL (ref 3.8–10.8)

## 2016-07-02 MED ORDER — FOLIC ACID 1 MG PO TABS: 2.0000 mg | ORAL_TABLET | Freq: Every day | ORAL | 4 refills | Status: DC

## 2016-07-02 MED ORDER — TRIAMCINOLONE ACETONIDE 40 MG/ML IJ SUSP
40.0000 mg | INTRAMUSCULAR | Status: AC | PRN
  Administered 2016-07-02: 40 mg via INTRA_ARTICULAR

## 2016-07-02 MED ORDER — GOLIMUMAB 50 MG/0.5ML ~~LOC~~ SOAJ: 50.0000 mg | SUBCUTANEOUS | 0 refills | Status: DC

## 2016-07-02 MED ORDER — LIDOCAINE HCL 1 % IJ SOLN
1.5000 mL | INTRAMUSCULAR | Status: AC | PRN
  Administered 2016-07-02: 1.5 mL

## 2016-07-02 MED ORDER — METHOTREXATE 2.5 MG PO TABS: ORAL_TABLET | ORAL | 0 refills | Status: DC

## 2016-07-02 MED ORDER — FOLIC ACID 1 MG PO TABS: 2.0000 mg | ORAL_TABLET | Freq: Every day | ORAL | 0 refills | Status: DC

## 2016-07-02 NOTE — Telephone Encounter (Signed)
Rx bin [phone] RX group [place] number [id number] I D number [id number] [facility]  New insurance  She is on Simponi Sub Q

## 2016-07-02 NOTE — Progress Notes (Addendum)
Rheumatology Medication Review by a Pharmacist Does the patient feel that his/her medications are working for him/her?  Yes Has the patient been experiencing any side effects to the medications prescribed?  No Does the patient have any problems obtaining medications?  Yes - patient has new insurance and needs new prior authorization.    Issues to address at subsequent visits: Prior authorization through patient's insurance   Pharmacist comments:  Tiffany Wilkins is a pleasant 51 yo F who presents for follow up of psoriatic arthritis.  She is currently taking Simponi 50 mg SQ every month, methotrexate 8 tablets weekly, and folic acid 2 mg daily.  Most recent standing labs were on 03/18/16.  Will recommend standing labs today.  Most recent TB Gold negative on 09/05/2015.  Will recommend TB Gold in June 2018.    Patient was provided with Simponi SQ sample today by Mr. Carlyon Shadow.  Patient wanted to self-inject sample in clinic.  Patient confirms it has been one month since her last Simponi injection.  Patient self-injected Simponi in her right leg.  Patient tolerated injection without adverse effects.    Will update patient once we know results of PA for Simponi.  Patient denies any questions or concerns regarding her medications at this time.   Elisabeth Most, Pharm.D., BCPS, CPP Clinical Pharmacist Pager: [phone] Phone: [phone] 07/02/2016 10:29 AM

## 2016-07-03 ENCOUNTER — Telehealth: Payer: Self-pay

## 2016-07-03 NOTE — Telephone Encounter (Signed)
Submitted a prior authorization request for Simponi through cover my meds. Received a "n/a" status online. Called [facility] [phone]. Spoke with Barbra Sarks who states that it is still pending. There was a form faxed to the office today from a clinician to clarify/verify some information. We have no record of receiving it. Fax number was verified. We will have to call back on Monday to have the form faxed back to [place] because the clinic office was closed.   Will call back on Monday, April 23rd to have forms faxed.   Raziah Funnell, [place], CPhT 3:52 PM

## 2016-07-06 ENCOUNTER — Telehealth: Payer: Self-pay | Admitting: Pharmacist

## 2016-07-06 NOTE — Telephone Encounter (Addendum)
Received approval for patient's Simponi.  Approval effective from 07/05/2016 to 07/06/2018.  I updated patient on this information.   Last visit: 07/02/16 Next visit: 11/03/16 Labs: 07/02/16 CBC, CMP normal 09/03/15 TB Gold negative  Okay to refill Simponi?  Per approval, this medication is required to be filled through [place].

## 2016-07-06 NOTE — Telephone Encounter (Signed)
Ok to refill. Can offer samples if needed if delay in obtaining simponi sub q

## 2016-07-06 NOTE — Telephone Encounter (Signed)
Patient received sample in clinic on 07/02/16.  She will not be due for next Simponi injection until 08/01/16.  I have advised patient to contact our office if she has any further difficulty getting prescription filled through [facility].  Patient voiced understanding.    Elisabeth Most, Pharm.D., BCPS, CPP Clinical Pharmacist Pager: [phone] Phone: [phone] 07/06/2016 10:23 AM

## 2016-07-07 ENCOUNTER — Other Ambulatory Visit: Payer: Self-pay | Admitting: Radiology

## 2016-07-07 ENCOUNTER — Telehealth: Payer: Self-pay | Admitting: Pharmacist

## 2016-07-07 MED ORDER — GOLIMUMAB 50 MG/0.5ML ~~LOC~~ SOAJ: 50.0000 mg | SUBCUTANEOUS | 0 refills | Status: DC

## 2016-07-07 NOTE — Telephone Encounter (Signed)
I received a call from [facility] with questions on patient's Simponi prescription.  They said they received a referral on this patient but have not received the prescription.  I have advised that the prescription was sent this morning.  Corene Cornea reports it is likely still being processed.  He reports he will call back tomorrow if there is any further difficulty with patient's prescription.    Elisabeth Most, Pharm.D., BCPS, CPP Clinical Pharmacist Pager: [phone] Phone: [phone] 07/07/2016 4:20 PM

## 2016-08-03 ENCOUNTER — Telehealth (INDEPENDENT_AMBULATORY_CARE_PROVIDER_SITE_OTHER): Payer: Self-pay | Admitting: Radiology

## 2016-08-03 NOTE — Telephone Encounter (Signed)
Message  Received: 2 weeks ago  Message Contents  Shona Needles, [place]  Yatziry Deakins, RT      Previous Messages    ----- Message -----  From: Eliezer Lofts, PA-C  Sent: 07/02/2016 10:29 AM  To: Shona Needles, RT   Apply for Visco both knees.  Hyalgan or Euflex or Orthovisc are acceptable.  If possible Hyalgan would be preferred. Graft patient has failed weight loss, and states, cortisone injection and she has proven osteoarthritis of knees with x-rays done 01/01/2016    I have faxed in for VOB Hyalgan, will followup.

## 2016-08-17 NOTE — Telephone Encounter (Signed)
[facility] has received the Rx and will be contacting patient to discuss copay/setup.

## 2016-08-20 NOTE — Telephone Encounter (Signed)
[facility] has reached out to patient and LMVM x2, they are waiting on patient to call them back.   [facility] ph [phone]

## 2016-08-28 NOTE — Telephone Encounter (Signed)
I called patient and told her to call [phone] and speak with bruit over the SPP and discussed the gel injections.I was able to reach the patient and she will call them back immediately (today)

## 2016-08-28 NOTE — Telephone Encounter (Signed)
SPP has called patient multiple times with no response from patient about gel injections, FYI.

## 2016-09-23 LAB — HM MAMMOGRAPHY

## 2016-09-28 ENCOUNTER — Telehealth: Payer: Self-pay | Admitting: Rheumatology

## 2016-09-28 ENCOUNTER — Ambulatory Visit (INDEPENDENT_AMBULATORY_CARE_PROVIDER_SITE_OTHER): Payer: 59

## 2016-09-28 ENCOUNTER — Encounter (INDEPENDENT_AMBULATORY_CARE_PROVIDER_SITE_OTHER): Payer: Self-pay | Admitting: Orthopaedic Surgery

## 2016-09-28 ENCOUNTER — Ambulatory Visit (INDEPENDENT_AMBULATORY_CARE_PROVIDER_SITE_OTHER): Payer: 59 | Admitting: Orthopaedic Surgery

## 2016-09-28 VITALS — BP 128/80 | HR 73 | Ht 62.0 in | Wt 299.0 lb

## 2016-09-28 DIAGNOSIS — M542 Cervicalgia: Secondary | ICD-10-CM

## 2016-09-28 MED ORDER — METHOCARBAMOL 500 MG PO TABS: 500.0000 mg | ORAL_TABLET | Freq: Every day | ORAL | 0 refills | Status: DC | PRN

## 2016-09-28 NOTE — Telephone Encounter (Signed)
Patient came into office. Patient states she has been having discomfort in her neck since June. Patient states over the weekend her feet came out from underneath her and she hit her head. Patient is now having trouble turing her head. Patient was given the option to follow up with urgent care or to see Dr.Whitfield. Patient has been worked into Dr.Whitfield's schedule.

## 2016-09-28 NOTE — Progress Notes (Signed)
Office Visit Note   Patient: Tiffany Wilkins           Date of Birth: 1965-03-27           MRN: [id number] Visit Date: 09/28/2016              Requested by: Rita Ohara, [address] [place], [place] [address] PCP: Rita Ohara, MD   Assessment & Plan: Visit Diagnoses:  1. Neck pain, acute   2. Cervicalgia   Cervical spine pain with evidence of muscle spasm. No radicular discomfort. No acute trauma identified by plain film  Plan: Soft cervical collar, Robaxin. Office 2 weeks if no improvement  consider physical therapy.  Follow-Up Instructions: Return in about 2 weeks (around 10/12/2016).   Orders:  Orders Placed This Encounter  Procedures  . XR Cervical Spine 2 or 3 views   Meds ordered this encounter  Medications  . methocarbamol (ROBAXIN) 500 MG tablet    Sig: Take 1 tablet (500 mg total) by mouth daily as needed for muscle spasms.    Dispense:  30 tablet    Refill:  0      Procedures: No procedures performed   Clinical Data: No additional findings.   Subjective: Chief Complaint  Patient presents with  . Neck - Pain    Tiffany Wilkins is a 51 y o that presents with neck pain 6 weeks, slept on a long trip, neck painful. Psoriatic arthritis, migraines, pain into her BIL shoulders.She fell while walking onto her neck and rear. Stabbing pains after the fall on 7/13.  Tiffany Wilkins noted onset of neck pain insidiously last month while visiting in [place]. He began experiencing some stiffness in her neck without numbness or tingling or referred pain to either upper extremity. She had an exacerbation of her pain this past Friday when she tripped over a stake in the street and fell backwards back of her head and neck. Since that time she's had considerable pain and stiffness particularly on the left side. No numbness or tingling. Trying over-the-counter medicines and either ice or heat. 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 She has limitation of rotation from a neutral position to both right and left. Minimal flexion extension related neck pain but no referred pain to either shoulder or either upper extremity. Good grip and release bilaterally. No skin changes no pain to range of motion of shoulder. Mild discomfort along the left side of her neck. Not on the right. No masses.  Specialty Comments:  No specialty comments available.  Imaging: Xr Cervical Spine 2 Or 3 Views  Result Date: 09/28/2016 Films of the cervical spine obtained in the AP lateral projections. There is reversal of the normal cervical lordosis. Joint spaces are well maintained. No evidence of listhesis. No ectopic calcification. No evidence of fracture or subluxation    PMFS History: Patient Active Problem List   Diagnosis Date Noted  . High risk medication use 06/30/2016  . Right hand pain 06/30/2016  . Right hip pain 06/30/2016  . Hepatic steatosis 06/27/2014  . BMI 50.0-59.9, adult ([place]) 06/27/2014  . Fibroid, uterine 06/27/2014  . Abdominal pain, chronic, right lower quadrant 06/27/2014  .  Obesity hypoventilation syndrome ([place]) 12/08/2013  . Nocturnal seizures ([place]) 12/08/2013  . OSA (obstructive sleep apnea) 12/08/2013  . Noncompliance with CPAP treatment 12/08/2013  . Psoriasis 11/29/2013  . Seizure disorder, complex partial ([place]) 06/01/2012  . Migraine 06/01/2012  . OSA on CPAP 01/04/2012  . Hiatal hernia 03/26/2011  . Internal hemorrhoid 03/24/2011  . Reflux esophagitis 03/24/2011  . Psoriatic arthritis ([place]) 01/22/2011  . Hernia of abdominal wall 01/22/2011   Past Medical History:  Diagnosis Date  . Allergic rhinitis, cause unspecified   . Anal fissure   . Arthritis    inflammatory and psoriatic--Dr. Estanislado Pandy  . Asthma    related to  allergies and colds  . Chronic bronchitis    gets bronchitis yearly with colds  . Chronic kidney disease kidney stones  . Fracture    stress fracture right foot  . GERD (gastroesophageal reflux disease)   . Hernia (acquired) (recurrent)    R lower abdomen; has seen [facility] (Dr. Zena Amos and recurred  . Hiatal hernia 03/2011  . Impaired fasting glucose   . Internal hemorrhoids 03/2011  . Menstrual migraine    Dr, Jannifer Franklin  . Obesity   . Reflux esophagitis 03/2011  . Seizure disorder ([place])    f/b Dr. Jannifer Franklin  . Seizures ([place]) last seisure june 2012  . Sleep apnea    inconclusive test per pt (some central and obstructive component)  . Sleep apnea 06/07/09   mild complex sleep apnea, worse in supine position (uses CPAP intermittently only)  . Tinea cruris 7/09    Family History  Problem Relation Age of Onset  . Arthritis Mother        rheumatoid  . Hyperlipidemia Mother   . Hypertension Mother   . Hyperthyroidism Mother        s/p thyroidectomy, now with hypothyroidism  . Cancer Father 62       AML, lung cancer, kidney cancer (all presented at same time)  . Diabetes Father   . Hypertension Father   . Hyperlipidemia Father   . Psoriasis Father   . Anxiety disorder Father        OCD, nervous breakdown  . Psoriasis Sister   . Irritable bowel syndrome Sister   . Arthritis Sister        psoriatic  . Diabetes Sister   . GER disease Daughter   . Asthma Son   . Asthma Maternal Grandmother   . Hyperlipidemia Maternal Grandmother   . Heart disease Maternal Grandmother   . Stroke Maternal Grandmother   . Diabetes Maternal Grandfather   . Heart disease Maternal Grandfather   . Cancer Paternal Grandmother 64       female (?ovarian)  . Diabetes Paternal Grandmother   . Hypertension Paternal Grandmother   . Diabetes Paternal Grandfather   . Heart disease Paternal Grandfather   . Hypertension Paternal Grandfather   . Diabetes Paternal Uncle   . Crohn's disease Paternal Uncle    . Cancer Paternal Uncle        ? type  . Colitis Paternal Uncle   . Stroke Maternal Aunt   . Stroke Cousin   . Esophageal cancer Neg Hx   . Stomach cancer Neg Hx     Past Surgical History:  Procedure Laterality Date  . CESAREAN SECTION     x2  . CHOLECYSTECTOMY  2005  . COLONOSCOPY  03/19/11   Dr. Olevia Perches; internal hemorrhoids  . ESOPHAGOGASTRODUODENOSCOPY  03/19/11   hiatal hernia, esophagitis  . KIDNEY STONE  SURGERY  08/2010   retrieved with basket (at [facility])  . repair of incisional hernia  2005, 2008   related to laparoscopic cholecystectomy   Social History   Occupational History  . Pattern maker [employer]   Social History Main Topics  . Smoking status: Never Smoker  . Smokeless tobacco: Never Used  . Alcohol use No     Comment: rarely  . Drug use: No  . Sexual activity: Yes    Partners: Male    Birth control/ protection: Surgical     Comment: husband had vasectomy     Garald Balding, MD   Note - This record has been created using [employer].  Chart creation errors have been sought, but may not always  have been located. Such creation errors do not reflect on  the standard of medical care.

## 2016-09-28 NOTE — Telephone Encounter (Signed)
Patient called stating that she has been dealing with neck pain since the first part of June.  Over the weekend she fell and it is now worse than before.  She is wanting to get fit into Dr. Arlean Hopping schedule ASAP.  Thank you.

## 2016-09-30 ENCOUNTER — Encounter: Payer: Self-pay | Admitting: Family Medicine

## 2016-09-30 ENCOUNTER — Encounter: Payer: Self-pay | Admitting: *Deleted

## 2016-10-12 ENCOUNTER — Ambulatory Visit (INDEPENDENT_AMBULATORY_CARE_PROVIDER_SITE_OTHER): Payer: 59 | Admitting: Neurology

## 2016-10-12 ENCOUNTER — Encounter: Payer: Self-pay | Admitting: Neurology

## 2016-10-12 VITALS — BP 122/80 | HR 69 | Ht 62.0 in | Wt 296.5 lb

## 2016-10-12 DIAGNOSIS — G40209 Localization-related (focal) (partial) symptomatic epilepsy and epileptic syndromes with complex partial seizures, not intractable, without status epilepticus: Secondary | ICD-10-CM | POA: Diagnosis not present

## 2016-10-12 MED ORDER — TOPIRAMATE ER 50 MG PO CAP24: 3.0000 | ORAL_CAPSULE | Freq: Every day | ORAL | 3 refills | Status: DC

## 2016-10-12 NOTE — Progress Notes (Signed)
Reason for visit: Seizures  Tiffany Wilkins is an 50 y.o. female  History of present illness:  Tiffany Wilkins is a 51 year old right-handed white female with a history of morbid obesity, psoriatic arthritis, and a history of seizures. The patient has done well with the seizures over the last year with exception that she had an aura to the seizure with dj vu around a period of time that she was not sleeping well because of neck discomfort. The patient did not have an overt seizure. She is on Trokendi taking 50 mg tablets, 3 tablets at nighttime. The patient is being evaluated for bariatric surgery. She has an abdominal hernia that needs to be repaired, and she was told that she needs to lose weight prior to the surgery. The patient is operating a motor vehicle. Sleep deprivation is a big seizure activator for her. In the past, the patient has also had difficulty following general anesthesia for surgery.  Past Medical History:  Diagnosis Date  . Allergic rhinitis, cause unspecified   . Anal fissure   . Arthritis    inflammatory and psoriatic--Dr. Estanislado Pandy  . Asthma    related to allergies and colds  . Chronic bronchitis    gets bronchitis yearly with colds  . Chronic kidney disease kidney stones  . Fracture    stress fracture right foot  . GERD (gastroesophageal reflux disease)   . Hernia (acquired) (recurrent)    R lower abdomen; has seen [facility] (Dr. Zena Amos and recurred  . Hiatal hernia 03/2011  . Impaired fasting glucose   . Internal hemorrhoids 03/2011  . Menstrual migraine    Dr, Jannifer Franklin  . Obesity   . Reflux esophagitis 03/2011  . Seizure disorder ([place])    f/b Dr. Jannifer Franklin  . Seizures ([place]) last seisure june 2012  . Sleep apnea    inconclusive test per pt (some central and obstructive component)  . Sleep apnea 06/07/09   mild complex sleep apnea, worse in supine position (uses CPAP intermittently only)  . Tinea cruris 7/09    Past Surgical History:  Procedure Laterality  Date  . CESAREAN SECTION     x2  . CHOLECYSTECTOMY  2005  . COLONOSCOPY  03/19/11   Dr. Olevia Perches; internal hemorrhoids  . ESOPHAGOGASTRODUODENOSCOPY  03/19/11   hiatal hernia, esophagitis  . KIDNEY STONE SURGERY  08/2010   retrieved with basket (at [facility])  . repair of incisional hernia  2005, 2008   related to laparoscopic cholecystectomy    Family History  Problem Relation Age of Onset  . Arthritis Mother        rheumatoid  . Hyperlipidemia Mother   . Hypertension Mother   . Hyperthyroidism Mother        s/p thyroidectomy, now with hypothyroidism  . Cancer Father 37       AML, lung cancer, kidney cancer (all presented at same time)  . Diabetes Father   . Hypertension Father   . Hyperlipidemia Father   . Psoriasis Father   . Anxiety disorder Father        OCD, nervous breakdown  . Psoriasis Sister   . Irritable bowel syndrome Sister   . Arthritis Sister        psoriatic  . Diabetes Sister   . GER disease Daughter   . Asthma Son   . Asthma Maternal Grandmother   . Hyperlipidemia Maternal Grandmother   . Heart disease Maternal Grandmother   . Stroke Maternal Grandmother   . Diabetes  Maternal Grandfather   . Heart disease Maternal Grandfather   . Cancer Paternal Grandmother 65       female (?ovarian)  . Diabetes Paternal Grandmother   . Hypertension Paternal Grandmother   . Diabetes Paternal Grandfather   . Heart disease Paternal Grandfather   . Hypertension Paternal Grandfather   . Diabetes Paternal Uncle   . Crohn's disease Paternal Uncle   . Cancer Paternal Uncle        ? type  . Colitis Paternal Uncle   . Stroke Maternal Aunt   . Stroke Cousin   . Esophageal cancer Neg Hx   . Stomach cancer Neg Hx     Social history:  reports that she has never smoked. She has never used smokeless tobacco. She reports that she does not drink alcohol or use drugs.    Allergies  Allergen Reactions  . Cosentyx [Secukinumab] Anaphylaxis  . Hydrocodone Itching and  Palpitations  . Amoxicillin Hives  . Codeine Itching and Other (See Comments)    Heart races.  . Keppra [Levetiracetam]     Suicide ideation  . Ketorolac Tromethamine Other (See Comments)    Stopped breathing.  . Sulfa Antibiotics Nausea Only  . Transderm-Scop [Scopolamine] Other (See Comments)    Stopped breathing.    Medications:  Prior to Admission medications   Medication Sig Start Date End Date Taking? Authorizing Provider  albuterol (PROVENTIL HFA;VENTOLIN HFA) 108 (90 BASE) MCG/ACT inhaler Inhale 2 puffs into the lungs as needed.     Yes [provider]  fluconazole (DIFLUCAN) 150 MG tablet Take 1 tablet (150 mg total) by mouth once. 08/30/12  Yes Tysinger, Camelia Eng, PA-C  folic acid (FOLVITE) 1 MG tablet Take 2 tablets (2 mg total) by mouth daily. 07/02/16 09/25/17 Yes Panwala, Naitik, PA-C  Golimumab 50 MG/0.5ML SOAJ Inject 50 mg into the skin every 30 (thirty) days. One auto injector sub q montly 07/07/16  Yes Deveshwar, Abel Presto, MD  ibuprofen (ADVIL,MOTRIN) 200 MG tablet Take 400 mg by mouth every 6 (six) hours as needed.    Yes [provider]  methocarbamol (ROBAXIN) 500 MG tablet Take 1 tablet (500 mg total) by mouth daily as needed for muscle spasms. 09/28/16  Yes Garald Balding, MD  methotrexate (RHEUMATREX) 2.5 MG tablet TAKE 8 TABLETS EVERY WEEK 07/02/16  Yes Panwala, Naitik, PA-C  Multiple Minerals-Vitamins (CALCIUM-MAGNESIUM-ZINC-D3) TABS Take 2 tablets by mouth daily.   Yes [provider]  Naproxen Sodium 220 MG CAPS Take 1 capsule by mouth as needed.    Yes [provider]  Topiramate ER (TROKENDI XR) 50 MG CP24 Take 3 capsules by mouth at bedtime.  07/04/15  Yes [provider]    ROS:  Out of a complete 14 system review of symptoms, the patient complains only of the following symptoms, and all other reviewed systems are negative.  Leg swelling Frequent waking Incontinence of the bladder, frequency of urination Joint  pain, joint swelling, muscle cramps, neck pain, neck stiffness Dizziness, headache  Blood pressure 122/80, pulse 69, height 5\' 2"  (1.575 m), weight 296 lb 8 oz (134.5 kg).  Physical Exam  General: The patient is alert and cooperative at the time of the examination. The patient is morbidly obese.  Skin: 1+ edema to ankles is seen bilaterally.   Neurologic Exam  Mental status: The patient is alert and oriented x 3 at the time of the examination. The patient has apparent normal recent and remote memory, with an apparently normal attention  span and concentration ability.   Cranial nerves: Facial symmetry is present. Speech is normal, no aphasia or dysarthria is noted. Extraocular movements are full. Visual fields are full.  Motor: The patient has good strength in all 4 extremities.  Sensory examination: Soft touch sensation is symmetric on the face, arms, and legs.  Coordination: The patient has good finger-nose-finger and heel-to-shin bilaterally.  Gait and station: The patient has a normal gait. Tandem gait was not tested. Romberg is negative. No drift is seen.  Reflexes: Deep tendon reflexes are symmetric, but are depressed.   Assessment/Plan:  1. History of seizures  2. Morbid obesity  3. Psoriatic arthritis  The patient had a recent aura for her seizures associated with sleep deprivation. Otherwise, she is generally very well controlled with her seizure events. The patient will remain on her current dose of Trokendi, she will follow-up in one year, sooner if needed. If she does undergo bariatric surgery in early 2019, she may require an IV medication and conversion to sprinkles of Topamax.  Jill Alexanders MD 10/12/2016 10:10 AM  [facility] [address] [place], [place] [address]  Phone [phone] Fax [phone]

## 2016-10-16 ENCOUNTER — Other Ambulatory Visit: Payer: Self-pay

## 2016-10-16 ENCOUNTER — Other Ambulatory Visit: Payer: Self-pay | Admitting: *Deleted

## 2016-10-16 DIAGNOSIS — Z79899 Other long term (current) drug therapy: Secondary | ICD-10-CM

## 2016-10-16 LAB — CBC WITH DIFFERENTIAL/PLATELET
Basophils Absolute: 47 cells/uL (ref 0–200)
Basophils Relative: 1 %
EOS ABS: 94 {cells}/uL (ref 15–500)
Eosinophils Relative: 2 %
HCT: 45.8 % — ABNORMAL HIGH (ref 35.0–45.0)
Hemoglobin: 15 g/dL (ref 11.7–15.5)
Lymphocytes Relative: 31 %
Lymphs Abs: 1457 cells/uL (ref 850–3900)
MCH: 30.1 pg (ref 27.0–33.0)
MCHC: 32.8 g/dL (ref 32.0–36.0)
MCV: 92 fL (ref 80.0–100.0)
MONO ABS: 893 {cells}/uL (ref 200–950)
MPV: 9.4 fL (ref 7.5–12.5)
Monocytes Relative: 19 %
NEUTROS PCT: 47 %
Neutro Abs: 2209 cells/uL (ref 1500–7800)
Platelets: 348 10*3/uL (ref 140–400)
RBC: 4.98 MIL/uL (ref 3.80–5.10)
RDW: 16 % — ABNORMAL HIGH (ref 11.0–15.0)
WBC: 4.7 10*3/uL (ref 3.8–10.8)

## 2016-10-17 LAB — COMPLETE METABOLIC PANEL WITH GFR
ALBUMIN: 4.2 g/dL (ref 3.6–5.1)
ALK PHOS: 75 U/L (ref 33–130)
ALT: 33 U/L — ABNORMAL HIGH (ref 6–29)
AST: 25 U/L (ref 10–35)
BILIRUBIN TOTAL: 0.4 mg/dL (ref 0.2–1.2)
BUN: 15 mg/dL (ref 7–25)
CALCIUM: 9.2 mg/dL (ref 8.6–10.4)
CO2: 23 mmol/L (ref 20–31)
Chloride: 102 mmol/L (ref 98–110)
Creat: 0.86 mg/dL (ref 0.50–1.05)
GFR, EST NON AFRICAN AMERICAN: 79 mL/min (ref >=60)
GLUCOSE: 89 mg/dL (ref 65–99)
POTASSIUM: 4.1 mmol/L (ref 3.5–5.3)
SODIUM: 139 mmol/L (ref 135–146)
TOTAL PROTEIN: 7.2 g/dL (ref 6.1–8.1)

## 2016-10-18 LAB — QUANTIFERON TB GOLD ASSAY (BLOOD)
INTERFERON GAMMA RELEASE ASSAY: NEGATIVE
QUANTIFERON TB AG MINUS NIL: 0.12 [IU]/mL
Quantiferon Nil Value: 0.09 IU/mL

## 2016-10-18 NOTE — Progress Notes (Signed)
Mild elevation of ALT. Avoid NSAIDS and alcohol . Rest of the labs are WNLs.

## 2016-10-19 NOTE — Progress Notes (Signed)
Tb gold neg

## 2016-10-20 ENCOUNTER — Encounter: Payer: Self-pay | Admitting: Rheumatology

## 2016-10-20 ENCOUNTER — Ambulatory Visit (INDEPENDENT_AMBULATORY_CARE_PROVIDER_SITE_OTHER): Payer: 59 | Admitting: Rheumatology

## 2016-10-20 VITALS — BP 118/78 | HR 80 | Resp 16 | Ht 62.0 in | Wt 299.0 lb

## 2016-10-20 DIAGNOSIS — L4050 Arthropathic psoriasis, unspecified: Secondary | ICD-10-CM

## 2016-10-20 DIAGNOSIS — G8929 Other chronic pain: Secondary | ICD-10-CM | POA: Diagnosis not present

## 2016-10-20 DIAGNOSIS — M25562 Pain in left knee: Secondary | ICD-10-CM

## 2016-10-20 DIAGNOSIS — Z79899 Other long term (current) drug therapy: Secondary | ICD-10-CM | POA: Diagnosis not present

## 2016-10-20 DIAGNOSIS — M25561 Pain in right knee: Secondary | ICD-10-CM | POA: Diagnosis not present

## 2016-10-20 DIAGNOSIS — L409 Psoriasis, unspecified: Secondary | ICD-10-CM | POA: Diagnosis not present

## 2016-10-20 DIAGNOSIS — M170 Bilateral primary osteoarthritis of knee: Secondary | ICD-10-CM

## 2016-10-20 NOTE — Progress Notes (Signed)
Office Visit Note  Patient: Tiffany Wilkins             Date of Birth: 1965/08/09           MRN: [id number]             PCP: Rita Ohara, MD Referring: Rita Ohara, MD Visit Date: 10/20/2016 Occupation: '@GUAROCC'$ @    Subjective:  No chief complaint on file.  History of Present Illness: Tiffany Wilkins is a 51 y.o. female  Was last seen in our office on 07/02/2016. On that visit, she reported that Symponi subcutaneous and methotrexate 8 pills per week were working well for her.  Note: Patient can't take===> (Cosentyx, ( Humira,Enbrel --> due to incr risk of seizures))   Today, patient reports that she is doing very well with the Symponi subcutaneous and methotrexate pills per week and folic acid 2 pills every day.  No joint pain swelling and stiffness. Patient has an abdominal hernia for which she is currently seeing [facility] for evaluation and treatment and possible surgery after appropriate weight loss.  She also reports that she strained her neck a few weeks back. She was unable to see [place] but was able to see Dr. Durward Fortes. X-ray show that she has reversal of her C-spine curvature consistent with muscle spasms. Patient got relief with ice, heat, muscle relaxer.  Activities of Daily Living:  Patient reports morning stiffness for 30 minutes.   Patient Reports nocturnal pain.  Difficulty dressing/grooming: Denies Difficulty climbing stairs: Reports Difficulty getting out of chair: Reports Difficulty using hands for taps, buttons, cutlery, and/or writing: Reports   Review of Systems  Constitutional: Negative for fatigue.  HENT: Negative for mouth sores and mouth dryness.   Eyes: Negative for dryness.  Respiratory: Negative for shortness of breath.   Gastrointestinal: Negative for constipation and diarrhea.  Musculoskeletal: Negative for myalgias and myalgias.  Skin: Negative for sensitivity to sunlight.  Psychiatric/Behavioral: Negative for decreased concentration and  sleep disturbance.    PMFS History:  Patient Active Problem List   Diagnosis Date Noted  . High risk medication use 06/30/2016  . Right hand pain 06/30/2016  . Right hip pain 06/30/2016  . Hepatic steatosis 06/27/2014  . BMI 50.0-59.9, adult ([place]) 06/27/2014  . Fibroid, uterine 06/27/2014  . Abdominal pain, chronic, right lower quadrant 06/27/2014  . Obesity hypoventilation syndrome ([place]) 12/08/2013  . Nocturnal seizures ([place]) 12/08/2013  . OSA (obstructive sleep apnea) 12/08/2013  . Noncompliance with CPAP treatment 12/08/2013  . Psoriasis 11/29/2013  . Seizure disorder, complex partial ([place]) 06/01/2012  . Migraine 06/01/2012  . OSA on CPAP 01/04/2012  . Hiatal hernia 03/26/2011  . Internal hemorrhoid 03/24/2011  . Reflux esophagitis 03/24/2011  . Psoriatic arthritis ([place]) 01/22/2011  . Hernia of abdominal wall 01/22/2011    Past Medical History:  Diagnosis Date  . Allergic rhinitis, cause unspecified   . Anal fissure   . Arthritis    inflammatory and psoriatic--Dr. Estanislado Pandy  . Asthma    related to allergies and colds  . Chronic bronchitis    gets bronchitis yearly with colds  . Chronic kidney disease kidney stones  . Fracture    stress fracture right foot  . GERD (gastroesophageal reflux disease)   . Hernia (acquired) (recurrent)    R lower abdomen; has seen [facility] (Dr. Zena Amos and recurred  . Hiatal hernia 03/2011  . Impaired fasting glucose   . Internal hemorrhoids 03/2011  . Menstrual migraine    Dr, Jannifer Franklin  .  Obesity   . Reflux esophagitis 03/2011  . Seizure disorder ([place])    f/b Dr. Jannifer Franklin  . Seizures ([place]) last seisure june 2012  . Sleep apnea    inconclusive test per pt (some central and obstructive component)  . Sleep apnea 06/07/09   mild complex sleep apnea, worse in supine position (uses CPAP intermittently only)  . Tinea cruris 7/09    Family History  Problem Relation Age of Onset  . Arthritis Mother        rheumatoid  . Hyperlipidemia  Mother   . Hypertension Mother   . Hyperthyroidism Mother        s/p thyroidectomy, now with hypothyroidism  . Cancer Father 69       AML, lung cancer, kidney cancer (all presented at same time)  . Diabetes Father   . Hypertension Father   . Hyperlipidemia Father   . Psoriasis Father   . Anxiety disorder Father        OCD, nervous breakdown  . Psoriasis Sister   . Irritable bowel syndrome Sister   . Arthritis Sister        psoriatic  . Diabetes Sister   . GER disease Daughter   . Asthma Son   . Asthma Maternal Grandmother   . Hyperlipidemia Maternal Grandmother   . Heart disease Maternal Grandmother   . Stroke Maternal Grandmother   . Diabetes Maternal Grandfather   . Heart disease Maternal Grandfather   . Cancer Paternal Grandmother 54       female (?ovarian)  . Diabetes Paternal Grandmother   . Hypertension Paternal Grandmother   . Diabetes Paternal Grandfather   . Heart disease Paternal Grandfather   . Hypertension Paternal Grandfather   . Diabetes Paternal Uncle   . Crohn's disease Paternal Uncle   . Cancer Paternal Uncle        ? type  . Colitis Paternal Uncle   . Stroke Maternal Aunt   . Stroke Cousin   . Esophageal cancer Neg Hx   . Stomach cancer Neg Hx    Past Surgical History:  Procedure Laterality Date  . CESAREAN SECTION     x2  . CHOLECYSTECTOMY  2005  . COLONOSCOPY  03/19/11   Dr. Olevia Perches; internal hemorrhoids  . ESOPHAGOGASTRODUODENOSCOPY  03/19/11   hiatal hernia, esophagitis  . KIDNEY STONE SURGERY  08/2010   retrieved with basket (at [facility])  . repair of incisional hernia  2005, 2008   related to laparoscopic cholecystectomy   Social History   Social History Narrative   Patient Lives with husband [name])  and 2 children (son and daughter, 15, 71)   Patient is right handed.   Patient has high school education.   Patient drinks 2 cups daily- coffee/soda                 Objective: Vital Signs: BP 118/78   Pulse 80   Resp 16   Ht 5'  2" (1.575 m)   Wt 299 lb (135.6 kg)   BMI 54.69 kg/m    Physical Exam  Constitutional: She is oriented to person, place, and time. She appears well-developed and well-nourished.  HENT:  Head: Normocephalic and atraumatic.  Eyes: Pupils are equal, round, and reactive to light. EOM are normal.  Cardiovascular: Normal rate, regular rhythm and normal heart sounds.  Exam reveals no gallop and no friction rub.   No murmur heard. Pulmonary/Chest: Effort normal and breath sounds normal. She has no wheezes. She has  no rales.  Abdominal: Soft. Bowel sounds are normal. She exhibits no distension. There is no tenderness. There is no guarding. No hernia.  Musculoskeletal: Normal range of motion. She exhibits no edema, tenderness or deformity.  Lymphadenopathy:    She has no cervical adenopathy.  Neurological: She is alert and oriented to person, place, and time. Coordination normal.  Skin: Skin is warm and dry. Capillary refill takes less than 2 seconds. No rash noted.  Psychiatric: She has a normal mood and affect. Her behavior is normal.  Nursing note and vitals reviewed.    Musculoskeletal Exam:  Good range of motion of all jointsExcept patient has decreased grip strength bilaterally with left third finger with poor flexion She has dactylitis on the right first digit and the left third digit mostly resolvedi Patient is improving with her Symponi subcutaneous but she does have history of old damage keeping her from having full fist formation Fiber myalgia tender points are absent   CDAI Exam: CDAI Homunculus Exam:   Tenderness:  Left hand: 3rd MCP  Joint Counts:  CDAI Tender Joint count: 1 CDAI Swollen Joint count: 0  Global Assessments:  Patient Global Assessment: 5 Provider Global Assessment: 5  CDAI Calculated Score: 11    Investigation: No additional findings. Orders Only on 10/16/2016  Component Date Value Ref Range Status  . Interferon Gamma Release Assay 10/16/2016  NEGATIVE  NEGATIVE Final   Negative test result. M. tuberculosis complex infection unlikely.  . Quantiferon Nil Value 10/16/2016 0.09  IU/mL Final  . Mitogen-Nil 10/16/2016 >10.00  IU/mL Final  . Quantiferon Tb Ag Minus Nil Value 10/16/2016 0.12  IU/mL Final   Comment:   The Nil tube value is used to determine if the patient has a preexisting immune response which could cause a false-positive reading on the test. In order for a test to be valid, the Nil tube must have a value of less than or equal to 8.0 IU/mL.   The mitogen control tube is used to assure the patient has a healthy immune status and also serves as a control for correct blood handling and incubation. It is used to detect false-negative readings. The mitogen tube must have a gamma interferon value of greater than or equal to 0.5 IU/mL higher than the value of the Nil tube.   The TB antigen tube is coated with the M. tuberculosis specific antigens. For a test to be considered positive, the TB antigen tube value minus the Nil tube value must be greater than or equal to 0.35 IU/mL.   For additional information, please refer to http://education.questdiagnostics.com/faq/QFT (This link is being provided for informational/educational purposes only.)   . WBC 10/16/2016 4.7  3.8 - 10.8 K/uL Final  . RBC 10/16/2016 4.98  3.80 - 5.10 MIL/uL Final  . Hemoglobin 10/16/2016 15.0  11.7 - 15.5 g/dL Final  . HCT 10/16/2016 45.8* 35.0 - 45.0 % Final  . MCV 10/16/2016 92.0  80.0 - 100.0 fL Final  . MCH 10/16/2016 30.1  27.0 - 33.0 pg Final  . MCHC 10/16/2016 32.8  32.0 - 36.0 g/dL Final  . RDW 10/16/2016 16.0* 11.0 - 15.0 % Final  . Platelets 10/16/2016 348  140 - 400 K/uL Final  . MPV 10/16/2016 9.4  7.5 - 12.5 fL Final  . Neutro Abs 10/16/2016 2209  1,500 - 7,800 cells/uL Final  . Lymphs Abs 10/16/2016 1457  850 - 3,900 cells/uL Final  . Monocytes Absolute 10/16/2016 893  200 - 950 cells/uL Final  .  Eosinophils Absolute 10/16/2016  94  15 - 500 cells/uL Final  . Basophils Absolute 10/16/2016 47  0 - 200 cells/uL Final  . Neutrophils Relative % 10/16/2016 47  % Final  . Lymphocytes Relative 10/16/2016 31  % Final  . Monocytes Relative 10/16/2016 19  % Final  . Eosinophils Relative 10/16/2016 2  % Final  . Basophils Relative 10/16/2016 1  % Final  . Smear Review 10/16/2016 Criteria for review not met   Final  . Sodium 10/16/2016 139  135 - 146 mmol/L Final  . Potassium 10/16/2016 4.1  3.5 - 5.3 mmol/L Final  . Chloride 10/16/2016 102  98 - 110 mmol/L Final  . CO2 10/16/2016 23  20 - 31 mmol/L Final  . Glucose, Bld 10/16/2016 89  65 - 99 mg/dL Final  . BUN 10/16/2016 15  7 - 25 mg/dL Final  . Creat 10/16/2016 0.86  0.50 - 1.05 mg/dL Final   Comment:   For patients > or = 51 years of age: The upper reference limit for Creatinine is approximately 13% higher for people identified as African-American.     . Total Bilirubin 10/16/2016 0.4  0.2 - 1.2 mg/dL Final  . Alkaline Phosphatase 10/16/2016 75  33 - 130 U/L Final  . AST 10/16/2016 25  10 - 35 U/L Final  . ALT 10/16/2016 33* 6 - 29 U/L Final  . Total Protein 10/16/2016 7.2  6.1 - 8.1 g/dL Final  . Albumin 10/16/2016 4.2  3.6 - 5.1 g/dL Final  . Calcium 10/16/2016 9.2  8.6 - 10.4 mg/dL Final  . GFR, Est African American 10/16/2016 >89  >=60 mL/min Final  . GFR, Est Non African American 10/16/2016 79  >=60 mL/min Final  Abstract on 09/30/2016  Component Date Value Ref Range Status  . HM Mammogram 09/23/2016 0-4 Bi-Rad  0-4 Bi-Rad, Self Reported Normal Final   normal  Office Visit on 07/02/2016  Component Date Value Ref Range Status  . WBC 07/02/2016 7.4  3.8 - 10.8 K/uL Final  . RBC 07/02/2016 4.73  3.80 - 5.10 MIL/uL Final  . Hemoglobin 07/02/2016 13.7  11.7 - 15.5 g/dL Final  . HCT 07/02/2016 42.5  35.0 - 45.0 % Final  . MCV 07/02/2016 89.9  80.0 - 100.0 fL Final  . MCH 07/02/2016 29.0  27.0 - 33.0 pg Final  . MCHC 07/02/2016 32.2  32.0 - 36.0 g/dL Final    . RDW 07/02/2016 14.9  11.0 - 15.0 % Final  . Platelets 07/02/2016 336  140 - 400 K/uL Final  . MPV 07/02/2016 9.4  7.5 - 12.5 fL Final  . Neutro Abs 07/02/2016 4588  1,500 - 7,800 cells/uL Final  . Lymphs Abs 07/02/2016 2220  850 - 3,900 cells/uL Final  . Monocytes Absolute 07/02/2016 518  200 - 950 cells/uL Final  . Eosinophils Absolute 07/02/2016 74  15 - 500 cells/uL Final  . Basophils Absolute 07/02/2016 0  0 - 200 cells/uL Final  . Neutrophils Relative % 07/02/2016 62  % Final  . Lymphocytes Relative 07/02/2016 30  % Final  . Monocytes Relative 07/02/2016 7  % Final  . Eosinophils Relative 07/02/2016 1  % Final  . Basophils Relative 07/02/2016 0  % Final  . Smear Review 07/02/2016 Criteria for review not met   Final  . Sodium 07/02/2016 140  135 - 146 mmol/L Final  . Potassium 07/02/2016 4.3  3.5 - 5.3 mmol/L Final  . Chloride 07/02/2016 106  98 - 110 mmol/L Final  .  CO2 07/02/2016 23  20 - 31 mmol/L Final  . Glucose, Bld 07/02/2016 98  65 - 99 mg/dL Final  . BUN 07/02/2016 13  7 - 25 mg/dL Final  . Creat 07/02/2016 0.70  0.50 - 1.05 mg/dL Final   Comment:   For patients > or = 51 years of age: The upper reference limit for Creatinine is approximately 13% higher for people identified as African-American.     . Total Bilirubin 07/02/2016 0.4  0.2 - 1.2 mg/dL Final  . Alkaline Phosphatase 07/02/2016 71  33 - 130 U/L Final  . AST 07/02/2016 20  10 - 35 U/L Final  . ALT 07/02/2016 22  6 - 29 U/L Final  . Total Protein 07/02/2016 7.2  6.1 - 8.1 g/dL Final  . Albumin 07/02/2016 3.9  3.6 - 5.1 g/dL Final  . Calcium 07/02/2016 9.2  8.6 - 10.4 mg/dL Final  . GFR, Est African American 07/02/2016 >89  >=60 mL/min Final  . GFR, Est Non African American 07/02/2016 >89  >=60 mL/min Final     Imaging: Xr Cervical Spine 2 Or 3 Views  Result Date: 09/28/2016 Films of the cervical spine obtained in the AP lateral projections. There is reversal of the normal cervical lordosis. Joint  spaces are well maintained. No evidence of listhesis. No ectopic calcification. No evidence of fracture or subluxation   Speciality Comments: No specialty comments available.    Procedures:  No procedures performed Allergies: Cosentyx [secukinumab]; Hydrocodone; Amoxicillin; Codeine; Keppra [levetiracetam]; Ketorolac tromethamine; Sulfa antibiotics; and Transderm-scop [scopolamine]   Assessment / Plan:     Visit Diagnoses: Psoriatic arthritis ([place])  Psoriasis  High risk medication use   Plan: #1: Psoriatic arthritis Doing well. No complaint except left third MCP joint with tenderness to palpation. History of same. No redness or warmth or swelling to that joint.  #2: Psoriasis. Doing well. No flare except to periumbilical area. She is using steroid cream.  #3: High-risk medication Simponi subcutaneous every month Methotrexate 8 pills per week Folic acid 2 mg every day Adequate response  #4:torticollis -- see xray results Patient is doing better with this compared to initial visit. Using muscle relaxer, heat, ice.  #5: Labs are up-to-date and normal. TB goal is negative as of August 2018.  #6: Bilateral knee OA. Patient will be getting Visco supplementation in the near future. It has been applied for and waiting on the patient to call her insurance company back.  #7: Return to clinic in 5 months   Orders: No orders of the defined types were placed in this encounter.  No orders of the defined types were placed in this encounter.   Face-to-face time spent with patient was 30 minutes. 50% of time was spent in counseling and coordination of care.  Follow-Up Instructions: Return in about 5 months (around 03/22/2017).   Eliezer Lofts, PA-C   I examined and evaluated the patient with Eliezer Lofts PA. Patient has no synovitis on my examination today. The plan of care was discussed as noted above.  Bo Merino, MD  Note - This record has been created using  Editor, commissioning.  Chart creation errors have been sought, but may not always  have been located. Such creation errors do not reflect on  the standard of medical care.

## 2016-10-26 ENCOUNTER — Other Ambulatory Visit: Payer: Self-pay | Admitting: Rheumatology

## 2016-10-26 ENCOUNTER — Telehealth: Payer: Self-pay | Admitting: Rheumatology

## 2016-10-26 ENCOUNTER — Telehealth: Payer: Self-pay | Admitting: *Deleted

## 2016-10-26 NOTE — Telephone Encounter (Signed)
Pharmacy requesting a refill on Patients Simponi. Fax#[phone] Due to be mailed out tomorrow 10/27/16

## 2016-10-26 NOTE — Telephone Encounter (Signed)
Pharmacy called, delivery of Hyalgan BIL, Deveshwar, patient purchased, 10/27/16 should be delivered to office. RX changed to 5 weeks instead of 4 weeks.

## 2016-10-26 NOTE — Telephone Encounter (Signed)
Last Visit: 10/20/16 Next Visit: due January 2019. Message sent to the front to schedule patient. Labs: 10/16/16 Mild elevation of ALT. Avoid NSAIDS and alcohol . Rest of the labs are WNLs TB Gold: 10/16/16 WNL  Okay to refill per Dr. Estanislado Pandy

## 2016-10-27 NOTE — Telephone Encounter (Signed)
Refill sent in on 10/26/16

## 2016-10-29 ENCOUNTER — Telehealth: Payer: Self-pay | Admitting: *Deleted

## 2016-10-29 NOTE — Telephone Encounter (Signed)
Hyalgan BIL, patient purchased, delivered to office 10/28/16, please call patient and schedule appt w/Dr. Estanislado Pandy x 5. Thank you.

## 2016-11-03 ENCOUNTER — Ambulatory Visit: Payer: 59 | Admitting: Rheumatology

## 2016-11-17 ENCOUNTER — Telehealth: Payer: Self-pay | Admitting: Rheumatology

## 2016-11-17 NOTE — Telephone Encounter (Signed)
Opened in error

## 2016-11-20 ENCOUNTER — Ambulatory Visit (INDEPENDENT_AMBULATORY_CARE_PROVIDER_SITE_OTHER): Payer: 59

## 2016-11-20 ENCOUNTER — Ambulatory Visit (INDEPENDENT_AMBULATORY_CARE_PROVIDER_SITE_OTHER): Payer: 59 | Admitting: Rheumatology

## 2016-11-20 DIAGNOSIS — M170 Bilateral primary osteoarthritis of knee: Secondary | ICD-10-CM

## 2016-11-20 DIAGNOSIS — M1711 Unilateral primary osteoarthritis, right knee: Secondary | ICD-10-CM

## 2016-11-20 DIAGNOSIS — M1712 Unilateral primary osteoarthritis, left knee: Secondary | ICD-10-CM

## 2016-11-20 MED ORDER — SODIUM HYALURONATE (VISCOSUP) 20 MG/2ML IX SOSY
20.0000 mg | PREFILLED_SYRINGE | INTRA_ARTICULAR | Status: AC | PRN
  Administered 2016-11-20: 20 mg via INTRA_ARTICULAR

## 2016-11-20 MED ORDER — LIDOCAINE HCL 1 % IJ SOLN
1.5000 mL | INTRAMUSCULAR | Status: AC | PRN
  Administered 2016-11-20: 1.5 mL

## 2016-11-20 NOTE — Progress Notes (Signed)
   Procedure Note  Patient: Tiffany Wilkins             Date of Birth: 1965/05/13           MRN: [id number]             Visit Date: 11/20/2016  Procedures: Visit Diagnoses: Primary osteoarthritis of both knees - Plan: XR KNEE 3 VIEW LEFT, Large Joint Injection/Arthrocentesis, Large Joint Injection/Arthrocentesis, CANCELED: Large Joint Injection/Arthrocentesis  Large Joint Inj Date/Time: 11/20/2016 1:04 PM Performed by: Bo Merino Authorized by: Bo Merino   Consent Given by:  Patient Site marked: the procedure site was marked   Timeout: prior to procedure the correct patient, procedure, and site was verified   Indications:  Pain Location:  Knee Site:  R knee Prep: patient was prepped and draped in usual sterile fashion   Needle Size:  27 G Needle Length:  1.5 inches Ultrasound Guidance: No   Fluoroscopic Guidance: No   Arthrogram: No   Medications:  20 mg Sodium Hyaluronate 20 MG/2ML; 1.5 mL lidocaine 1 % Aspiration Attempted: Yes   Patient tolerance:  Patient tolerated the procedure well with no immediate complications Large Joint Inj Date/Time: 11/20/2016 1:05 PM Performed by: Bo Merino Authorized by: Bo Merino   Consent Given by:  Patient Site marked: the procedure site was marked   Timeout: prior to procedure the correct patient, procedure, and site was verified   Indications:  Pain Location:  Knee Site:  L knee Prep: patient was prepped and draped in usual sterile fashion   Needle Size:  27 G Needle Length:  1.5 inches Ultrasound Guidance: No   Fluoroscopic Guidance: No   Arthrogram: No   Medications:  1.5 mL lidocaine 1 %; 20 mg Sodium Hyaluronate 20 MG/2ML Aspiration Attempted: Yes   Patient tolerance:  Patient tolerated the procedure well with no immediate complications    Bo Merino, MD

## 2016-11-20 NOTE — Progress Notes (Deleted)
   Procedure Note  Patient: Tiffany Wilkins             Date of Birth: 26-Apr-1965           MRN: [id number]             Visit Date: 11/20/2016  Procedures: Visit Diagnoses: Primary osteoarthritis of both knees - Plan: XR KNEE 3 VIEW LEFT, Large Joint Injection/Arthrocentesis, Large Joint Injection/Arthrocentesis  Large Joint Inj Date/Time: 11/20/2016 1:07 PM Performed by: Bo Merino Authorized by: Bo Merino   Consent Given by:  Patient Site marked: the procedure site was marked   Timeout: prior to procedure the correct patient, procedure, and site was verified   Indications:  Pain and joint swelling Location:  Knee Site:  R knee Prep: patient was prepped and draped in usual sterile fashion   Needle Size:  27 G Needle Length:  1.5 inches Approach:  Medial Ultrasound Guidance: No   Fluoroscopic Guidance: No   Arthrogram: No   Medications:  20 mg Sodium Hyaluronate 20 MG/2ML; 1.5 mL lidocaine 1 % Aspiration Attempted: Yes   Patient tolerance:  Patient tolerated the procedure well with no immediate complications

## 2016-11-26 ENCOUNTER — Ambulatory Visit (INDEPENDENT_AMBULATORY_CARE_PROVIDER_SITE_OTHER): Payer: 59 | Admitting: *Deleted

## 2016-11-26 DIAGNOSIS — M1711 Unilateral primary osteoarthritis, right knee: Secondary | ICD-10-CM | POA: Diagnosis not present

## 2016-11-26 DIAGNOSIS — M1712 Unilateral primary osteoarthritis, left knee: Secondary | ICD-10-CM

## 2016-11-26 DIAGNOSIS — M170 Bilateral primary osteoarthritis of knee: Secondary | ICD-10-CM

## 2016-11-26 MED ORDER — LIDOCAINE HCL 1 % IJ SOLN
1.5000 mL | INTRAMUSCULAR | Status: AC | PRN
  Administered 2016-11-26: 1.5 mL

## 2016-11-26 MED ORDER — SODIUM HYALURONATE (VISCOSUP) 20 MG/2ML IX SOSY
20.0000 mg | PREFILLED_SYRINGE | INTRA_ARTICULAR | Status: AC | PRN
  Administered 2016-11-26: 20 mg via INTRA_ARTICULAR

## 2016-11-26 NOTE — Progress Notes (Signed)
   Procedure Note  Patient: Tiffany Wilkins             Date of Birth: [birth year]           MRN: [id number]             Visit Date: 11/26/2016  Procedures: Visit Diagnoses: Primary osteoarthritis of both knees Hyalgan #2 BKJ P/P Large Joint Inj Date/Time: 11/26/2016 12:33 PM Performed by: Bo Merino Authorized by: Bo Merino   Consent Given by:  Patient Site marked: the procedure site was marked   Timeout: prior to procedure the correct patient, procedure, and site was verified   Indications:  Pain and joint swelling Location:  Knee Site:  R knee Prep: patient was prepped and draped in usual sterile fashion   Needle Size:  27 G Needle Length:  1.5 inches Approach:  Medial Ultrasound Guidance: No   Fluoroscopic Guidance: No   Arthrogram: No   Medications:  1.5 mL lidocaine 1 %; 20 mg Sodium Hyaluronate 20 MG/2ML Aspiration Attempted: Yes   Aspirate amount (mL):  0 Patient tolerance:  Patient tolerated the procedure well with no immediate complications Large Joint Inj Date/Time: 11/26/2016 12:34 PM Performed by: Bo Merino Authorized by: Bo Merino   Consent Given by:  Patient Site marked: the procedure site was marked   Timeout: prior to procedure the correct patient, procedure, and site was verified   Indications:  Pain and joint swelling Location:  Knee Site:  L knee Prep: patient was prepped and draped in usual sterile fashion   Needle Size:  27 G Needle Length:  1.5 inches Approach:  Medial Ultrasound Guidance: No   Fluoroscopic Guidance: No   Arthrogram: No   Medications:  1.5 mL lidocaine 1 %; 20 mg Sodium Hyaluronate 20 MG/2ML Aspiration Attempted: Yes   Aspirate amount (mL):  0 Patient tolerance:  Patient tolerated the procedure well with no immediate complications   Bo Merino, MD

## 2016-11-27 ENCOUNTER — Ambulatory Visit: Payer: 59 | Admitting: Rheumatology

## 2016-12-01 NOTE — Progress Notes (Signed)
   Procedure Note  Patient: Tiffany Wilkins             Date of Birth: [birth year]           MRN: [id number]             Visit Date: 12/11/2016  Procedures: Visit Diagnoses: Bilateral primary osteoarthritis of knee - Plan: Large Joint Injection/Arthrocentesis, Large Joint Injection/Arthrocentesis Hyalgan #4 bilateral knees patient purchase  Large Joint Inj Date/Time: 12/11/2016 11:41 AM Performed by: Bo Merino Authorized by: Bo Merino   Consent Given by:  Patient Site marked: the procedure site was marked   Timeout: prior to procedure the correct patient, procedure, and site was verified   Indications:  Pain Location:  Knee Site:  R knee Prep: patient was prepped and draped in usual sterile fashion   Needle Size:  27 G Needle Length:  1.5 inches Ultrasound Guidance: No   Fluoroscopic Guidance: No   Arthrogram: No   Medications:  1.5 mL lidocaine 1 %; 20 mg Sodium Hyaluronate 20 MG/2ML Aspiration Attempted: Yes   Patient tolerance:  Patient tolerated the procedure well with no immediate complications Large Joint Inj Date/Time: 12/11/2016 11:41 AM Performed by: Bo Merino Authorized by: Bo Merino   Consent Given by:  Patient Site marked: the procedure site was marked   Timeout: prior to procedure the correct patient, procedure, and site was verified   Indications:  Pain Location:  Knee Site:  L knee Prep: patient was prepped and draped in usual sterile fashion   Needle Size:  27 G Needle Length:  1.5 inches Ultrasound Guidance: No   Fluoroscopic Guidance: No   Arthrogram: No   Medications:  1.5 mL lidocaine 1 %; 20 mg Sodium Hyaluronate 20 MG/2ML Aspiration Attempted: Yes   Patient tolerance:  Patient tolerated the procedure well with no immediate complications    Bo Merino, MD

## 2016-12-04 ENCOUNTER — Ambulatory Visit (INDEPENDENT_AMBULATORY_CARE_PROVIDER_SITE_OTHER): Payer: 59 | Admitting: Rheumatology

## 2016-12-04 ENCOUNTER — Telehealth: Payer: Self-pay | Admitting: *Deleted

## 2016-12-04 DIAGNOSIS — M1711 Unilateral primary osteoarthritis, right knee: Secondary | ICD-10-CM

## 2016-12-04 DIAGNOSIS — M170 Bilateral primary osteoarthritis of knee: Secondary | ICD-10-CM

## 2016-12-04 DIAGNOSIS — M1712 Unilateral primary osteoarthritis, left knee: Secondary | ICD-10-CM

## 2016-12-04 MED ORDER — PREDNISONE 5 MG PO TABS: ORAL_TABLET | ORAL | 0 refills | Status: DC

## 2016-12-04 MED ORDER — SODIUM HYALURONATE (VISCOSUP) 20 MG/2ML IX SOSY
20.0000 mg | PREFILLED_SYRINGE | INTRA_ARTICULAR | Status: AC | PRN
  Administered 2016-12-04: 20 mg via INTRA_ARTICULAR

## 2016-12-04 MED ORDER — LIDOCAINE HCL 1 % IJ SOLN
1.5000 mL | INTRAMUSCULAR | Status: AC | PRN
  Administered 2016-12-04: 1.5 mL

## 2016-12-04 NOTE — Progress Notes (Signed)
   Procedure Note  Patient: Tiffany Wilkins             Date of Birth: [birth year]           MRN: [id number]             Visit Date: 12/04/2016  Procedures: Visit Diagnoses: Bilateral primary osteoarthritis of knee - Plan: Large Joint Injection/Arthrocentesis, Large Joint Injection/Arthrocentesis   HYALGAN BIL #3 PATIENT PURCHASED  Large Joint Inj Date/Time: 12/04/2016 11:40 AM Performed by: Bo Merino Authorized by: Bo Merino   Consent Given by:  Patient Site marked: the procedure site was marked   Timeout: prior to procedure the correct patient, procedure, and site was verified   Indications:  Pain and joint swelling Location:  Knee Site:  R knee Prep: patient was prepped and draped in usual sterile fashion   Needle Size:  27 G Needle Length:  1.5 inches Approach:  Medial Ultrasound Guidance: No   Fluoroscopic Guidance: No   Arthrogram: No   Medications:  1.5 mL lidocaine 1 %; 20 mg Sodium Hyaluronate 20 MG/2ML Aspiration Attempted: Yes   Patient tolerance:  Patient tolerated the procedure well with no immediate complications Large Joint Inj Date/Time: 12/04/2016 11:41 AM Performed by: Bo Merino Authorized by: Bo Merino   Consent Given by:  Patient Site marked: the procedure site was marked   Timeout: prior to procedure the correct patient, procedure, and site was verified   Indications:  Pain and joint swelling Location:  Knee Site:  L knee Prep: patient was prepped and draped in usual sterile fashion   Needle Size:  27 G Needle Length:  1.5 inches Approach:  Medial Ultrasound Guidance: No   Fluoroscopic Guidance: No   Arthrogram: No   Medications:  20 mg Sodium Hyaluronate 20 MG/2ML; 1.5 mL lidocaine 1 % Aspiration Attempted: Yes   Patient tolerance:  Patient tolerated the procedure well with no immediate complications   Bo Merino, MD

## 2016-12-04 NOTE — Telephone Encounter (Signed)
Patient in office today for a Hyalgen injection. Patient is having swelling in her left second finger and her left hand. Patient is taking the Methotrexate and Simponi as prescribed. Patient states she feels well controlled on the Simopni and is just having a flare. Per Dr. Estanislado Pandy okay to send in Prednisone taper.

## 2016-12-11 ENCOUNTER — Ambulatory Visit (INDEPENDENT_AMBULATORY_CARE_PROVIDER_SITE_OTHER): Payer: 59 | Admitting: Rheumatology

## 2016-12-11 DIAGNOSIS — M1711 Unilateral primary osteoarthritis, right knee: Secondary | ICD-10-CM | POA: Diagnosis not present

## 2016-12-11 DIAGNOSIS — M1712 Unilateral primary osteoarthritis, left knee: Secondary | ICD-10-CM | POA: Diagnosis not present

## 2016-12-11 DIAGNOSIS — M170 Bilateral primary osteoarthritis of knee: Secondary | ICD-10-CM

## 2016-12-11 MED ORDER — SODIUM HYALURONATE (VISCOSUP) 20 MG/2ML IX SOSY
20.0000 mg | PREFILLED_SYRINGE | INTRA_ARTICULAR | Status: AC | PRN
  Administered 2016-12-11: 20 mg via INTRA_ARTICULAR

## 2016-12-11 MED ORDER — LIDOCAINE HCL 1 % IJ SOLN
1.5000 mL | INTRAMUSCULAR | Status: AC | PRN
  Administered 2016-12-11: 1.5 mL

## 2016-12-11 NOTE — Progress Notes (Signed)
   Procedure Note  Patient: Tiffany Wilkins             Date of Birth: 11/14/1965           MRN: [id number]             Visit Date: 12/18/2016  Procedures: Visit Diagnoses: Primary osteoarthritis of both knees Hyalgan #5 bilateral patient purchase Large Joint Inj Date/Time: 12/18/2016 11:54 AM Performed by: Bo Merino Authorized by: Bo Merino   Consent Given by:  Patient Site marked: the procedure site was marked   Timeout: prior to procedure the correct patient, procedure, and site was verified   Indications:  Pain Location:  Knee Site:  R knee Prep: patient was prepped and draped in usual sterile fashion   Needle Size:  27 G Needle Length:  1.5 inches Ultrasound Guidance: No   Fluoroscopic Guidance: No   Arthrogram: No   Medications:  20 mg Sodium Hyaluronate 20 MG/2ML; 1.5 mL lidocaine 1 % Aspiration Attempted: Yes   Patient tolerance:  Patient tolerated the procedure well with no immediate complications Large Joint Inj Date/Time: 12/18/2016 11:55 AM Performed by: Bo Merino Authorized by: Bo Merino   Consent Given by:  Patient Site marked: the procedure site was marked   Timeout: prior to procedure the correct patient, procedure, and site was verified   Indications:  Pain Location:  Knee Site:  L knee Prep: patient was prepped and draped in usual sterile fashion   Needle Size:  27 G Needle Length:  1.5 inches Ultrasound Guidance: No   Fluoroscopic Guidance: No   Arthrogram: No   Medications:  20 mg Sodium Hyaluronate 20 MG/2ML; 1.5 mL lidocaine 1 % Aspiration Attempted: Yes   Patient tolerance:  Patient tolerated the procedure well with no immediate complications    Bo Merino, MD

## 2016-12-18 ENCOUNTER — Ambulatory Visit (INDEPENDENT_AMBULATORY_CARE_PROVIDER_SITE_OTHER): Payer: 59 | Admitting: Rheumatology

## 2016-12-18 DIAGNOSIS — M1711 Unilateral primary osteoarthritis, right knee: Secondary | ICD-10-CM | POA: Diagnosis not present

## 2016-12-18 DIAGNOSIS — M170 Bilateral primary osteoarthritis of knee: Secondary | ICD-10-CM

## 2016-12-18 DIAGNOSIS — M1712 Unilateral primary osteoarthritis, left knee: Secondary | ICD-10-CM | POA: Diagnosis not present

## 2016-12-18 MED ORDER — SODIUM HYALURONATE (VISCOSUP) 20 MG/2ML IX SOSY
20.0000 mg | PREFILLED_SYRINGE | INTRA_ARTICULAR | Status: AC | PRN
  Administered 2016-12-18: 20 mg via INTRA_ARTICULAR

## 2016-12-18 MED ORDER — LIDOCAINE HCL 1 % IJ SOLN
1.5000 mL | INTRAMUSCULAR | Status: AC | PRN
  Administered 2016-12-18: 1.5 mL

## 2017-01-18 ENCOUNTER — Other Ambulatory Visit: Payer: Self-pay | Admitting: Rheumatology

## 2017-01-18 NOTE — Telephone Encounter (Signed)
Last Visit: 11/20/16 Next Visit: 02/18/17 Labs: 10/16/16  TB Gold: 10/16/16 Neg  Okay to refill per Dr. Estanislado Pandy

## 2017-02-05 NOTE — Progress Notes (Signed)
Office Visit Note  Patient: Tiffany Wilkins             Date of Birth: Jul 27, 1965           MRN: [id number]             PCP: Rita Ohara, MD Referring: Rita Ohara, MD Visit Date: 02/18/2017 Occupation: @GUAROCC @    Subjective:  Hand pain and swelling    History of Present Illness: Tiffany Wilkins is a 51 y.o. female with a history of psoriatic arthritis.  Patient states her Simponi subQ dose was delayed getting to her in November.  She was due 02/04/17 and was not able to take it until 02/10/17, which caused her to flare.  She is experiencing bilateral hand pain and swelling.  Her right knee, right ankle, and right MTPs are painful and swollen.  She is taking Methotrexate 8 tablets and folic acid 2 mg. She states she has active psoriasis patches on her periumbilical, gluteal cleft, and a patch behind her right ear.  She has been using topical cream.     Activities of Daily Living:  Patient reports morning stiffness for  all day.   Patient Reports nocturnal pain.  Difficulty dressing/grooming: Denies Difficulty climbing stairs: Denies Difficulty getting out of chair: Reports Difficulty using hands for taps, buttons, cutlery, and/or writing: Reports   Review of Systems  Constitutional: Positive for fatigue. Negative for weakness.  HENT: Negative for mouth sores, mouth dryness and nose dryness.   Eyes: Negative for dryness.  Respiratory: Negative for cough, hemoptysis, shortness of breath and difficulty breathing.   Cardiovascular: Negative.  Positive for swelling in legs/feet. Negative for chest pain, palpitations, hypertension and irregular heartbeat.  Gastrointestinal: Negative.  Negative for blood in stool, constipation and diarrhea.  Endocrine: Negative for increased urination.  Genitourinary: Negative for painful urination.  Musculoskeletal: Positive for arthralgias, joint pain, joint swelling and morning stiffness. Negative for myalgias, muscle weakness, muscle tenderness and  myalgias.  Skin: Positive for rash. Negative for color change, pallor, hair loss, nodules/bumps, redness, skin tightness, ulcers and sensitivity to sunlight.  Neurological: Negative for dizziness, numbness and headaches.  Hematological: Positive for bruising/bleeding tendency. Negative for swollen glands.  Psychiatric/Behavioral: Negative.  Negative for depressed mood and sleep disturbance. The patient is not nervous/anxious.     PMFS History:  Patient Active Problem List   Diagnosis Date Noted  . Primary osteoarthritis of both knees 12/11/2016  . High risk medication use 06/30/2016  . Right hand pain 06/30/2016  . Right hip pain 06/30/2016  . Hepatic steatosis 06/27/2014  . BMI 50.0-59.9, adult ([place]) 06/27/2014  . Fibroid, uterine 06/27/2014  . Abdominal pain, chronic, right lower quadrant 06/27/2014  . Obesity hypoventilation syndrome ([place]) 12/08/2013  . Nocturnal seizures ([place]) 12/08/2013  . OSA (obstructive sleep apnea) 12/08/2013  . Noncompliance with CPAP treatment 12/08/2013  . Psoriasis 11/29/2013  . Seizure disorder, complex partial ([place]) 06/01/2012  . Migraine 06/01/2012  . OSA on CPAP 01/04/2012  . Hiatal hernia 03/26/2011  . Internal hemorrhoid 03/24/2011  . Reflux esophagitis 03/24/2011  . Psoriatic arthritis ([place]) 01/22/2011  . Hernia of abdominal wall 01/22/2011    Past Medical History:  Diagnosis Date  . Allergic rhinitis, cause unspecified   . Anal fissure   . Arthritis    inflammatory and psoriatic--Dr. Estanislado Pandy  . Asthma    related to allergies and colds  . Chronic bronchitis    gets bronchitis yearly with colds  . Chronic kidney disease kidney  stones  . Fracture    stress fracture right foot  . GERD (gastroesophageal reflux disease)   . Hernia (acquired) (recurrent)    R lower abdomen; has seen [facility] (Dr. Zena Amos and recurred  . Hiatal hernia 03/2011  . Impaired fasting glucose   . Internal hemorrhoids 03/2011  . Menstrual migraine    Dr,  Jannifer Franklin  . Obesity   . Reflux esophagitis 03/2011  . Seizure disorder ([place])    f/b Dr. Jannifer Franklin  . Seizures ([place]) last seisure june 2012  . Sleep apnea    inconclusive test per pt (some central and obstructive component)  . Sleep apnea 06/07/09   mild complex sleep apnea, worse in supine position (uses CPAP intermittently only)  . Tinea cruris 7/09    Family History  Problem Relation Age of Onset  . Arthritis Mother        rheumatoid  . Hyperlipidemia Mother   . Hypertension Mother   . Hyperthyroidism Mother        s/p thyroidectomy, now with hypothyroidism  . Cancer Father 7       AML, lung cancer, kidney cancer (all presented at same time)  . Diabetes Father   . Hypertension Father   . Hyperlipidemia Father   . Psoriasis Father   . Anxiety disorder Father        OCD, nervous breakdown  . Psoriasis Sister   . Irritable bowel syndrome Sister   . Arthritis Sister        psoriatic  . Diabetes Sister   . GER disease Daughter   . Asthma Son   . Asthma Maternal Grandmother   . Hyperlipidemia Maternal Grandmother   . Heart disease Maternal Grandmother   . Stroke Maternal Grandmother   . Diabetes Maternal Grandfather   . Heart disease Maternal Grandfather   . Cancer Paternal Grandmother 19       female (?ovarian)  . Diabetes Paternal Grandmother   . Hypertension Paternal Grandmother   . Diabetes Paternal Grandfather   . Heart disease Paternal Grandfather   . Hypertension Paternal Grandfather   . Diabetes Paternal Uncle   . Crohn's disease Paternal Uncle   . Cancer Paternal Uncle        ? type  . Colitis Paternal Uncle   . Stroke Maternal Aunt   . Stroke Cousin   . Esophageal cancer Neg Hx   . Stomach cancer Neg Hx    Past Surgical History:  Procedure Laterality Date  . CESAREAN SECTION     x2  . CHOLECYSTECTOMY  2005  . COLONOSCOPY  03/19/11   Dr. Olevia Perches; internal hemorrhoids  . ESOPHAGOGASTRODUODENOSCOPY  03/19/11   hiatal hernia, esophagitis  . KIDNEY STONE  SURGERY  08/2010   retrieved with basket (at [facility])  . repair of incisional hernia  2005, 2008   related to laparoscopic cholecystectomy   Social History   Social History Narrative   Patient Lives with husband [name])  and 2 children (son and daughter, 26, 58)   Patient is right handed.   Patient has high school education.   Patient drinks 2 cups daily- coffee/soda              Objective: Vital Signs: BP 127/85 (BP Location: Left Arm, Patient Position: Sitting, Cuff Size: Large)   Pulse 71   Resp 15   Ht 5' 2"  (1.575 m)   Wt 294 lb (133.4 kg)   BMI 53.77 kg/m    Physical Exam  Constitutional:  She is oriented to person, place, and time. She appears well-developed and well-nourished.  HENT:  Head: Normocephalic and atraumatic.  Eyes: Conjunctivae and EOM are normal.  Neck: Normal range of motion.  Cardiovascular: Normal rate, regular rhythm, normal heart sounds and intact distal pulses.  Pulmonary/Chest: Effort normal and breath sounds normal.  Abdominal: Soft. Bowel sounds are normal.  Lymphadenopathy:    She has no cervical adenopathy.  Neurological: She is alert and oriented to person, place, and time.  Skin: Skin is warm and dry. Capillary refill takes less than 2 seconds.  Psoriasis in periumbilical area, gluteal cleft, and behind right ear  Psychiatric: She has a normal mood and affect. Her behavior is normal.  Nursing note and vitals reviewed.    Musculoskeletal Exam: C-spine, thoracic, and lumbar good ROM.  Shoulder joints good ROM with discomfort.  Elbow and wrist joints good ROM.  MCPs, PIPs, and DIPs good ROM.  Left 2nd DIP synovitis.  Incomplete fist formation of the left hand.  Decreased grip strength bilaterally.  Hip joints and knee joints good ROM.  Limited ROM of bilateral ankles. Edema in bilateral ankles.     CDAI Exam: CDAI Homunculus Exam:   Tenderness:  Left hand: 2nd DIP RLE: tibiofemoral and tibiotalar Right foot: 1st MTP, 2nd MTP, 3rd  MTP, 4th MTP and 5th MTP  Swelling:  Left hand: 2nd DIP RLE: tibiofemoral  Joint Counts:  CDAI Tender Joint count: 1 CDAI Swollen Joint count: 1  Global Assessments:  Patient Global Assessment: 5 Provider Global Assessment: 5  CDAI Calculated Score: 12    Investigation: No additional findings.TB Gold: 10/16/2016 Negative  CBC Latest Ref Rng & Units 10/16/2016 07/02/2016 03/18/2016  WBC 3.8 - 10.8 K/uL 4.7 7.4 6.5  Hemoglobin 11.7 - 15.5 g/dL 15.0 13.7 15.7  Hematocrit 35.0 - 45.0 % 45.8(H) 42.5 46.0  Platelets 140 - 400 K/uL 348 336 278   CMP Latest Ref Rng & Units 10/16/2016 07/02/2016 03/18/2016  Glucose 65 - 99 mg/dL 89 98 98  BUN 7 - 25 mg/dL 15 13 17   Creatinine 0.50 - 1.05 mg/dL 0.86 0.70 0.80  Sodium 135 - 146 mmol/L 139 140 140  Potassium 3.5 - 5.3 mmol/L 4.1 4.3 4.6  Chloride 98 - 110 mmol/L 102 106 102  CO2 20 - 31 mmol/L 23 23 29   Calcium 8.6 - 10.4 mg/dL 9.2 9.2 8.9  Total Protein 6.1 - 8.1 g/dL 7.2 7.2 7.4  Total Bilirubin 0.2 - 1.2 mg/dL 0.4 0.4 0.2  Alkaline Phos 33 - 130 U/L 75 71 82  AST 10 - 35 U/L 25 20 23   ALT 6 - 29 U/L 33(H) 22 37(H)    Imaging: No results found.  Speciality Comments: TB Gold: 10/16/16 Neg    Procedures:  No procedures performed Allergies: Cosentyx [secukinumab]; Hydrocodone; Amoxicillin; Codeine; Keppra [levetiracetam]; Ketorolac tromethamine; Sulfa antibiotics; and Transderm-scop [scopolamine]   Assessment / Plan:     Visit Diagnoses: Psoriatic arthritis [facility]): Patient had a delay receiving her last dose of Simponi subcutaneous (originially supposed to receive it 11/22 but did not have it until 02/10/17), which caused a flare.  She continues to have bilateral hand pain.  She takes Ibuprofen 200 mg TID for pain relief.          Psoriasis: She continues to have psoriasis patches in the periumbilical region, gluteal cleft, and behind left ear.  She continues to use topical steroid cream.  High risk medication use - Simponi, MTX, folic  acid  -  Routine lab monitoring drawn today.  TB gold was performed 10/16/16, which was negative. Plan: CBC with Differential/Platelet, COMPLETE METABOLIC PANEL WITH GFR  HLA B27 positive  Other medical conditions are listed as follows:   Angioma - L spine   History of epilepsy - Dr. Jannifer Franklin   History of sleep apnea  History of asthma  History of bronchitis  History of obesity  History of abdominal hernia  Family history of rheumatoid arthritis - mother   History of cholecystectomy  History of hernia repair - x2    Orders: Orders Placed This Encounter  Procedures  . CBC with Differential/Platelet  . COMPLETE METABOLIC PANEL WITH GFR   No orders of the defined types were placed in this encounter.   Face-to-face time spent with patient was 30 minutes.Greater than 50% of time was spent in counseling and coordination of care.  Follow-Up Instructions: Return in about 5 months (around 07/19/2017) for Psoriatic arthritis.  Bo Merino, MD Note - This record has been created using Editor, commissioning.  Chart creation errors have been sought, but may not always  have been located. Such creation errors do not reflect on  the standard of medical care.

## 2017-02-10 ENCOUNTER — Telehealth: Payer: Self-pay | Admitting: Neurology

## 2017-02-10 NOTE — Telephone Encounter (Signed)
The patient was placed on naltrexone and Wellbutrin through the weight loss center, the patient has had increased frequency of seizures.  She needs to come off the Wellbutrin, she did stop this last week.  If she continues to have seizures, she is to contact our office.

## 2017-02-10 NOTE — Telephone Encounter (Signed)
Pt called she is being seen at [facility] and has been prescribed several medications. She has been having break thru seizures, this weekend she had a lot tonic clonic seizures. She quit taking maltrexone 50 mg a couple weeks ago. She quit taking bupropion 150mg  before Thanksgiving. Please call to discuss

## 2017-02-18 ENCOUNTER — Encounter: Payer: Self-pay | Admitting: Rheumatology

## 2017-02-18 ENCOUNTER — Ambulatory Visit (INDEPENDENT_AMBULATORY_CARE_PROVIDER_SITE_OTHER): Payer: 59 | Admitting: Rheumatology

## 2017-02-18 VITALS — BP 127/85 | HR 71 | Resp 15 | Ht 62.0 in | Wt 294.0 lb

## 2017-02-18 DIAGNOSIS — Z1589 Genetic susceptibility to other disease: Secondary | ICD-10-CM | POA: Diagnosis not present

## 2017-02-18 DIAGNOSIS — Z8669 Personal history of other diseases of the nervous system and sense organs: Secondary | ICD-10-CM

## 2017-02-18 DIAGNOSIS — D1800 Hemangioma unspecified site: Secondary | ICD-10-CM | POA: Diagnosis not present

## 2017-02-18 DIAGNOSIS — L409 Psoriasis, unspecified: Secondary | ICD-10-CM

## 2017-02-18 DIAGNOSIS — Z8709 Personal history of other diseases of the respiratory system: Secondary | ICD-10-CM

## 2017-02-18 DIAGNOSIS — Z9889 Other specified postprocedural states: Secondary | ICD-10-CM

## 2017-02-18 DIAGNOSIS — Z8639 Personal history of other endocrine, nutritional and metabolic disease: Secondary | ICD-10-CM | POA: Diagnosis not present

## 2017-02-18 DIAGNOSIS — Z79899 Other long term (current) drug therapy: Secondary | ICD-10-CM

## 2017-02-18 DIAGNOSIS — Z9049 Acquired absence of other specified parts of digestive tract: Secondary | ICD-10-CM | POA: Diagnosis not present

## 2017-02-18 DIAGNOSIS — L4050 Arthropathic psoriasis, unspecified: Secondary | ICD-10-CM | POA: Diagnosis not present

## 2017-02-18 DIAGNOSIS — Z8261 Family history of arthritis: Secondary | ICD-10-CM | POA: Diagnosis not present

## 2017-02-18 DIAGNOSIS — Z8719 Personal history of other diseases of the digestive system: Secondary | ICD-10-CM

## 2017-02-18 LAB — CBC WITH DIFFERENTIAL/PLATELET
BASOS ABS: 68 {cells}/uL (ref 0–200)
Basophils Relative: 0.7 %
EOS ABS: 68 {cells}/uL (ref 15–500)
EOS PCT: 0.7 %
HEMATOCRIT: 42.7 % (ref 35.0–45.0)
HEMOGLOBIN: 14.1 g/dL (ref 11.7–15.5)
LYMPHS ABS: 3191 {cells}/uL (ref 850–3900)
MCH: 30.1 pg (ref 27.0–33.0)
MCHC: 33 g/dL (ref 32.0–36.0)
MCV: 91 fL (ref 80.0–100.0)
MPV: 9.5 fL (ref 7.5–12.5)
Monocytes Relative: 8.1 %
NEUTROS ABS: 5587 {cells}/uL (ref 1500–7800)
Neutrophils Relative %: 57.6 %
Platelets: 358 10*3/uL (ref 140–400)
RBC: 4.69 10*6/uL (ref 3.80–5.10)
RDW: 14 % (ref 11.0–15.0)
Total Lymphocyte: 32.9 %
WBC mixed population: 786 cells/uL (ref 200–950)
WBC: 9.7 10*3/uL (ref 3.8–10.8)

## 2017-02-18 LAB — COMPLETE METABOLIC PANEL WITH GFR
AG Ratio: 1.3 (calc) (ref 1.0–2.5)
ALBUMIN MSPROF: 4 g/dL (ref 3.6–5.1)
ALKALINE PHOSPHATASE (APISO): 74 U/L (ref 33–130)
ALT: 23 U/L (ref 6–29)
AST: 16 U/L (ref 10–35)
BUN: 15 mg/dL (ref 7–25)
CALCIUM: 9.5 mg/dL (ref 8.6–10.4)
CHLORIDE: 104 mmol/L (ref 98–110)
CO2: 28 mmol/L (ref 20–32)
Creat: 0.78 mg/dL (ref 0.50–1.05)
GFR, Est African American: 102 mL/min/{1.73_m2} (ref >=60)
GFR, Est Non African American: 88 mL/min/{1.73_m2} (ref >=60)
GLOBULIN: 3.1 g/dL (ref 1.9–3.7)
Glucose, Bld: 103 mg/dL — ABNORMAL HIGH (ref 65–99)
POTASSIUM: 4.6 mmol/L (ref 3.5–5.3)
Sodium: 141 mmol/L (ref 135–146)
Total Bilirubin: 0.3 mg/dL (ref 0.2–1.2)
Total Protein: 7.1 g/dL (ref 6.1–8.1)

## 2017-02-19 NOTE — Progress Notes (Signed)
Labs WNL.

## 2017-04-19 ENCOUNTER — Other Ambulatory Visit: Payer: Self-pay | Admitting: Rheumatology

## 2017-04-19 NOTE — Telephone Encounter (Signed)
Last visit: 02/18/2017 Next visit: 07/21/2017 Labs: 02/18/2017 WNL  TB Gold: 10/16/2016 Negative   Okay to refill per Dr. Estanislado Pandy.

## 2017-04-22 ENCOUNTER — Telehealth (INDEPENDENT_AMBULATORY_CARE_PROVIDER_SITE_OTHER): Payer: Self-pay | Admitting: Radiology

## 2017-04-22 NOTE — Telephone Encounter (Signed)
Email from patient for med request-  First Name: Tiffany Last Name: Lynelle Wilkins Address: [place]: [place]: [place] Zip: [place] Phone: [phone] Email: [email or web address] Comments: I am out of my methotrexate. Can you please contact [facility] and refill. Please advise if I need to come in prior you can reach me at my work number [phone]

## 2017-04-23 ENCOUNTER — Telehealth: Payer: Self-pay | Admitting: Rheumatology

## 2017-04-23 MED ORDER — METHOTREXATE 2.5 MG PO TABS: ORAL_TABLET | ORAL | 0 refills | Status: DC

## 2017-04-23 NOTE — Telephone Encounter (Signed)
See previous phone note.  

## 2017-04-23 NOTE — Telephone Encounter (Signed)
Patient is out of MTX, and needs a refill for Friday. Patient request a 3 month supply sent to [facility]. Patient is in a flare right now.

## 2017-04-23 NOTE — Telephone Encounter (Signed)
Patient had an ample supply of her MTX due to getting a double amount of her last prescription. Patient is now out of her MTX and needs refill.   Last visit: 02/18/2017 Next visit: 07/21/2017 Labs: 02/18/2017 WNL   Okay to refill per Dr. Estanislado Pandy

## 2017-06-10 ENCOUNTER — Other Ambulatory Visit: Payer: Self-pay | Admitting: Rheumatology

## 2017-06-11 ENCOUNTER — Other Ambulatory Visit: Payer: Self-pay

## 2017-06-11 DIAGNOSIS — Z79899 Other long term (current) drug therapy: Secondary | ICD-10-CM

## 2017-06-11 LAB — CBC WITH DIFFERENTIAL/PLATELET
BASOS ABS: 69 {cells}/uL (ref 0–200)
Basophils Relative: 1 %
EOS PCT: 0.9 %
Eosinophils Absolute: 62 cells/uL (ref 15–500)
HCT: 42.5 % (ref 35.0–45.0)
Hemoglobin: 14.1 g/dL (ref 11.7–15.5)
Lymphs Abs: 2891 cells/uL (ref 850–3900)
MCH: 30.2 pg (ref 27.0–33.0)
MCHC: 33.2 g/dL (ref 32.0–36.0)
MCV: 91 fL (ref 80.0–100.0)
MPV: 9.5 fL (ref 7.5–12.5)
Monocytes Relative: 8.2 %
NEUTROS PCT: 48 %
Neutro Abs: 3312 cells/uL (ref 1500–7800)
PLATELETS: 350 10*3/uL (ref 140–400)
RBC: 4.67 10*6/uL (ref 3.80–5.10)
RDW: 14.4 % (ref 11.0–15.0)
Total Lymphocyte: 41.9 %
WBC mixed population: 566 cells/uL (ref 200–950)
WBC: 6.9 10*3/uL (ref 3.8–10.8)

## 2017-06-11 LAB — COMPLETE METABOLIC PANEL WITH GFR
AG RATIO: 1.4 (calc) (ref 1.0–2.5)
ALKALINE PHOSPHATASE (APISO): 72 U/L (ref 33–130)
ALT: 30 U/L — AB (ref 6–29)
AST: 22 U/L (ref 10–35)
Albumin: 4.2 g/dL (ref 3.6–5.1)
BILIRUBIN TOTAL: 0.4 mg/dL (ref 0.2–1.2)
BUN: 12 mg/dL (ref 7–25)
CHLORIDE: 107 mmol/L (ref 98–110)
CO2: 29 mmol/L (ref 20–32)
Calcium: 9.4 mg/dL (ref 8.6–10.4)
Creat: 0.71 mg/dL (ref 0.50–1.05)
GFR, EST AFRICAN AMERICAN: 114 mL/min/{1.73_m2} (ref >=60)
GFR, Est Non African American: 99 mL/min/{1.73_m2} (ref >=60)
Globulin: 3 g/dL (calc) (ref 1.9–3.7)
Glucose, Bld: 110 mg/dL — ABNORMAL HIGH (ref 65–99)
POTASSIUM: 4.6 mmol/L (ref 3.5–5.3)
Sodium: 143 mmol/L (ref 135–146)
TOTAL PROTEIN: 7.2 g/dL (ref 6.1–8.1)

## 2017-06-14 NOTE — Progress Notes (Signed)
Glucose is mildly elevated.  ALT  borderline elevated.  She is on MTX 8 tablets weekly.  We will continue to monitor her LFTs.

## 2017-07-05 ENCOUNTER — Other Ambulatory Visit: Payer: Self-pay | Admitting: Rheumatology

## 2017-07-05 NOTE — Telephone Encounter (Signed)
Last visit: 02/18/2017 Next visit: 07/21/2017 Labs: 06/11/17 Glucose is mildly elevated. ALT borderline elevated. We will continue to monitor her LFTs.   Okay to refill per Dr. Estanislado Pandy

## 2017-07-09 NOTE — Progress Notes (Signed)
Office Visit Note  Patient: Tiffany Wilkins             Date of Birth: 29-Jan-1966           MRN: [id number]             PCP: Rita Ohara, MD Referring: Rita Ohara, MD Visit Date: 07/21/2017 Occupation: @GUAROCC @    Subjective:  Pain in both hands and feet.  History of Present Illness: Tiffany Wilkins is a 52 y.o. female with history of psoriatic arthritis.  Patient continues to take Symphony, methotrexate tablets once weekly, and folic acid 2 mg daily.  She continues to have pain and swelling in her bilateral feet.  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  She denies any SI joint pain, Achilles tendinitis, plantar fasciitis.  She states that this combination of Symphony and methotrexate have worked the best out of any other medication she has tried in the past.  She states that she continues to have pain in her right knee.  She also has chronic fatigue.  Activities of Daily Living:  Patient reports morning stiffness for 2-3 hours.   Patient Reports nocturnal pain.  Difficulty dressing/grooming: Denies Difficulty climbing stairs: Reports Difficulty getting out of chair: Reports Difficulty using hands for taps, buttons, cutlery, and/or writing: Reports   Review of Systems  Constitutional: Positive for fatigue.  HENT: Negative for mouth sores, mouth dryness and nose dryness.   Eyes: Positive for dryness. Negative for pain and visual disturbance.  Respiratory: Negative for cough, hemoptysis, shortness of breath and difficulty breathing.   Cardiovascular: Negative for chest pain, palpitations, hypertension and swelling in legs/feet.  Gastrointestinal: Negative for blood in stool, constipation and diarrhea.  Endocrine: Negative for increased urination.  Genitourinary: Negative for painful urination.  Musculoskeletal: Positive for arthralgias, joint pain, joint swelling and morning stiffness. Negative for myalgias, muscle weakness, muscle tenderness and myalgias.  Skin:  Positive for rash (Psoriasis). Negative for color change, pallor, hair loss, nodules/bumps, skin tightness, ulcers and sensitivity to sunlight.  Allergic/Immunologic: Negative for susceptible to infections.  Neurological: Negative for dizziness, numbness, headaches and weakness.  Hematological: Negative for swollen glands.  Psychiatric/Behavioral: Positive for sleep disturbance. Negative for depressed mood. The patient is not nervous/anxious.     PMFS History:  Patient Active Problem List   Diagnosis Date Noted  . Primary osteoarthritis of both knees 12/11/2016  . High risk medication use 06/30/2016  . Right hand pain 06/30/2016  . Right hip pain 06/30/2016  . Hepatic steatosis 06/27/2014  . BMI 50.0-59.9, adult ([place]) 06/27/2014  . Fibroid, uterine 06/27/2014  . Abdominal pain, chronic, right lower quadrant 06/27/2014  . Obesity hypoventilation syndrome ([place]) 12/08/2013  . Nocturnal seizures ([place]) 12/08/2013  . OSA (obstructive sleep apnea) 12/08/2013  . Noncompliance with CPAP treatment 12/08/2013  . Psoriasis 11/29/2013  . Seizure disorder, complex partial ([place]) 06/01/2012  . Migraine 06/01/2012  . OSA on CPAP 01/04/2012  . Hiatal hernia 03/26/2011  . Internal hemorrhoid 03/24/2011  . Reflux esophagitis 03/24/2011  . Psoriatic arthritis ([place]) 01/22/2011  . Hernia of abdominal wall 01/22/2011    Past Medical History:  Diagnosis Date  . Allergic rhinitis, cause unspecified   . Anal fissure   . Arthritis    inflammatory and psoriatic--Dr. Estanislado Pandy  . Asthma    related to allergies and colds  . Chronic bronchitis    gets bronchitis yearly with colds  . Chronic kidney disease kidney stones  . Fracture  stress fracture right foot  . GERD (gastroesophageal reflux disease)   . Hernia (acquired) (recurrent)    R lower abdomen; has seen [facility] (Dr. Zena Amos and recurred  . Hiatal hernia 03/2011  . Impaired fasting glucose   . Internal hemorrhoids 03/2011  . Menstrual  migraine    Dr, Jannifer Franklin  . Obesity   . Reflux esophagitis 03/2011  . Seizure disorder ([place])    f/b Dr. Jannifer Franklin  . Seizures ([place]) last seisure june 2012  . Sleep apnea    inconclusive test per pt (some central and obstructive component)  . Sleep apnea 06/07/09   mild complex sleep apnea, worse in supine position (uses CPAP intermittently only)  . Tinea cruris 7/09    Family History  Problem Relation Age of Onset  . Arthritis Mother        rheumatoid  . Hyperlipidemia Mother   . Hypertension Mother   . Hyperthyroidism Mother        s/p thyroidectomy, now with hypothyroidism  . Cancer Father 68       AML, lung cancer, kidney cancer (all presented at same time)  . Diabetes Father   . Hypertension Father   . Hyperlipidemia Father   . Psoriasis Father   . Anxiety disorder Father        OCD, nervous breakdown  . Psoriasis Sister   . Irritable bowel syndrome Sister   . Arthritis Sister        psoriatic  . Diabetes Sister   . GER disease Daughter   . Asthma Son   . Asthma Maternal Grandmother   . Hyperlipidemia Maternal Grandmother   . Heart disease Maternal Grandmother   . Stroke Maternal Grandmother   . Diabetes Maternal Grandfather   . Heart disease Maternal Grandfather   . Cancer Paternal Grandmother 65       female (?ovarian)  . Diabetes Paternal Grandmother   . Hypertension Paternal Grandmother   . Diabetes Paternal Grandfather   . Heart disease Paternal Grandfather   . Hypertension Paternal Grandfather   . Diabetes Paternal Uncle   . Crohn's disease Paternal Uncle   . Cancer Paternal Uncle        ? type  . Colitis Paternal Uncle   . Stroke Maternal Aunt   . Stroke Cousin   . Esophageal cancer Neg Hx   . Stomach cancer Neg Hx    Past Surgical History:  Procedure Laterality Date  . CESAREAN SECTION     x2  . CHOLECYSTECTOMY  2005  . COLONOSCOPY  03/19/11   Dr. Olevia Perches; internal hemorrhoids  . ESOPHAGOGASTRODUODENOSCOPY  03/19/11   hiatal hernia, esophagitis  .  KIDNEY STONE SURGERY  08/2010   retrieved with basket (at [facility])  . repair of incisional hernia  2005, 2008   related to laparoscopic cholecystectomy   Social History   Social History Narrative   Patient Lives with husband [name])  and 2 children (son and daughter, 52, 47)   Patient is right handed.   Patient has high school education.   Patient drinks 2 cups daily- coffee/soda              Objective: Vital Signs: BP 118/78 (BP Location: Left Arm, Patient Position: Sitting, Cuff Size: Normal)   Pulse 70   Resp 18   Ht 5' 2"  (1.575 m)   Wt 295 lb (133.8 kg)   BMI 53.96 kg/m    Physical Exam  Constitutional: She is oriented to person, place, and  time. She appears well-developed and well-nourished.  HENT:  Head: Normocephalic and atraumatic.  Eyes: Conjunctivae and EOM are normal.  Neck: Normal range of motion.  Cardiovascular: Normal rate, regular rhythm, normal heart sounds and intact distal pulses.  Pulmonary/Chest: Effort normal and breath sounds normal.  Abdominal: Soft. Bowel sounds are normal.  Lymphadenopathy:    She has no cervical adenopathy.  Neurological: She is alert and oriented to person, place, and time.  Skin: Skin is warm and dry. Capillary refill takes less than 2 seconds.  Psychiatric: She has a normal mood and affect. Her behavior is normal.  Nursing note and vitals reviewed.    Musculoskeletal Exam: C-spine, thoracic, and lumbar spine good ROM. No midline spinal tenderness.  No SI joint tenderness. Shoulder joints, elbow joints, wrist joints, MCPs, PIPs, DIPs good ROM. Right thumb dactylitis. Left 2nd digit dactylitis. Hip joints, knee joints, ankle joints, MTPs, PIPs, and DIPs good ROM. Bilateral 2nd and 3rd toe dactylitis. Dorsum of right foot swelling No warmth or effusion of knee joints.  No tenderness of trochanteric bursitis.   CDAI Exam: CDAI Homunculus Exam:   Tenderness:  Right hand: 1st MCP Left hand: 2nd PIP and 2nd DIP Right foot:  2nd MTP, 3rd MTP, 2nd PIP and 3rd PIP Left foot: 2nd MTP, 3rd MTP, 2nd PIP and 3rd PIP  Swelling:  Right hand: 1st MCP Left hand: 2nd PIP and 2nd DIP Right foot: 2nd MTP, 3rd MTP, 2nd PIP and 3rd PIP Left foot: 2nd MTP, 3rd MTP, 2nd PIP and 3rd PIP  Joint Counts:  CDAI Tender Joint count: 2 CDAI Swollen Joint count: 2     Investigation: No additional findings.TB Gold: 10/16/2016 Negative  CBC Latest Ref Rng & Units 06/11/2017 02/18/2017 10/16/2016  WBC 3.8 - 10.8 Thousand/uL 6.9 9.7 4.7  Hemoglobin 11.7 - 15.5 g/dL 14.1 14.1 15.0  Hematocrit 35.0 - 45.0 % 42.5 42.7 45.8(H)  Platelets 140 - 400 Thousand/uL 350 358 348   CMP Latest Ref Rng & Units 06/11/2017 02/18/2017 10/16/2016  Glucose 65 - 99 mg/dL 110(H) 103(H) 89  BUN 7 - 25 mg/dL 12 15 15   Creatinine 0.50 - 1.05 mg/dL 0.71 0.78 0.86  Sodium 135 - 146 mmol/L 143 141 139  Potassium 3.5 - 5.3 mmol/L 4.6 4.6 4.1  Chloride 98 - 110 mmol/L 107 104 102  CO2 20 - 32 mmol/L 29 28 23   Calcium 8.6 - 10.4 mg/dL 9.4 9.5 9.2  Total Protein 6.1 - 8.1 g/dL 7.2 7.1 7.2  Total Bilirubin 0.2 - 1.2 mg/dL 0.4 0.3 0.4  Alkaline Phos 33 - 130 U/L - - 75  AST 10 - 35 U/L 22 16 25   ALT 6 - 29 U/L 30(H) 23 33(H)    Imaging: No results found.  Speciality Comments: TB Gold: 10/16/16 Neg    Procedures:  No procedures performed Allergies: Cosentyx [secukinumab]; Hydrocodone; Amoxicillin; Codeine; Keppra [levetiracetam]; Ketorolac tromethamine; Sulfa antibiotics; and Transderm-scop [scopolamine]   Assessment / Plan:     Visit Diagnoses: Psoriatic arthritis ([place]): She has synovitis and dactylitis of several joints.  She has dactylitis of her left second digit as well as dactylitis in her feet of second and third digits.  She continues to be on Simponi subcutaneous injections once a month and methotrexate 8 tablets weekly and folic acid 2 mg daily.  She feels that this is the best medication combination that she has had so far and does not want to make  any changes at this time.  She  is having bariatric surgery at the end of June and was advised to hold her Simponi for 1 month and methotrexate for 1 week prior to surgery.  She should be cleared by her surgeon before restarting on her medications.  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  She was given a refill of clobetasol cream.  High risk medication use - Simponi, MTX, folic acid.  ALT was 40 in March.  We will recheck labs in June and every 3 months to monitor for drug toxicity.  HLA B27 positive  Other medical conditions are listed as follows:  History of epilepsy - Dr. Jannifer Franklin   History of hernia repair - x2  History of sleep apnea  History of cholecystectomy  History of asthma  History of abdominal hernia  History of bronchitis  History of obesity  Family history of rheumatoid arthritis - mother    Orders: No orders of the defined types were placed in this encounter.  Meds ordered this encounter  Medications  . diclofenac sodium (VOLTAREN) 1 % GEL    Sig: Apply 3 g to 3 large joints up to 3 times daily.    Dispense:  3 Tube    Refill:  3  . clobetasol cream (TEMOVATE) 0.05 %    Sig: Apply 1 application topically 2 (two) times daily.    Dispense:  45 g    Refill:  0    Face-to-face time spent with patient was 30 minutes. >50% of time was spent in counseling and coordination of care.  Follow-Up Instructions: Return in about 5 months (around 12/21/2017) for Psoriatic arthritis.   [name], PA-C   I examined and evaluated the patient with Hazel Sams PA.  Patient continues to have dactylitis in several digits as described above.  She is satisfied with the current therapy and does not want to change treatment.  The plan of care was discussed as noted above.  Bo Merino, MD  Note - This record has been created using Editor, commissioning.  Chart creation errors have been sought, but may not always  have been located. Such creation  errors do not reflect on  the standard of medical care.

## 2017-07-21 ENCOUNTER — Encounter: Payer: Self-pay | Admitting: Rheumatology

## 2017-07-21 ENCOUNTER — Ambulatory Visit (INDEPENDENT_AMBULATORY_CARE_PROVIDER_SITE_OTHER): Payer: 59 | Admitting: Rheumatology

## 2017-07-21 VITALS — BP 118/78 | HR 70 | Resp 18 | Ht 62.0 in | Wt 295.0 lb

## 2017-07-21 DIAGNOSIS — Z8709 Personal history of other diseases of the respiratory system: Secondary | ICD-10-CM

## 2017-07-21 DIAGNOSIS — Z1589 Genetic susceptibility to other disease: Secondary | ICD-10-CM | POA: Diagnosis not present

## 2017-07-21 DIAGNOSIS — Z8639 Personal history of other endocrine, nutritional and metabolic disease: Secondary | ICD-10-CM

## 2017-07-21 DIAGNOSIS — L4050 Arthropathic psoriasis, unspecified: Secondary | ICD-10-CM

## 2017-07-21 DIAGNOSIS — Z8719 Personal history of other diseases of the digestive system: Secondary | ICD-10-CM | POA: Diagnosis not present

## 2017-07-21 DIAGNOSIS — L409 Psoriasis, unspecified: Secondary | ICD-10-CM

## 2017-07-21 DIAGNOSIS — Z8261 Family history of arthritis: Secondary | ICD-10-CM

## 2017-07-21 DIAGNOSIS — Z79899 Other long term (current) drug therapy: Secondary | ICD-10-CM | POA: Diagnosis not present

## 2017-07-21 DIAGNOSIS — Z9049 Acquired absence of other specified parts of digestive tract: Secondary | ICD-10-CM | POA: Diagnosis not present

## 2017-07-21 DIAGNOSIS — Z8669 Personal history of other diseases of the nervous system and sense organs: Secondary | ICD-10-CM

## 2017-07-21 DIAGNOSIS — Z9889 Other specified postprocedural states: Secondary | ICD-10-CM | POA: Diagnosis not present

## 2017-07-21 MED ORDER — CLOBETASOL PROPIONATE 0.05 % EX CREA: 1.0000 "application " | TOPICAL_CREAM | Freq: Two times a day (BID) | CUTANEOUS | 0 refills | Status: DC

## 2017-07-21 MED ORDER — FLUCONAZOLE 150 MG PO TABS
ORAL_TABLET | ORAL | 1 refills | Status: DC
Start: 2017-07-21 — End: 2019-05-16

## 2017-07-21 MED ORDER — DICLOFENAC SODIUM 1 % TD GEL: TRANSDERMAL | 3 refills | Status: DC

## 2017-07-21 NOTE — Patient Instructions (Signed)
Standing Labs We placed an order today for your standing lab work.    Please come back and get your standing labs in June and every 3 months  We have open lab Monday through Friday from 8:30-11:30 AM and 1:30-4:00 PM  at the office of Dr. Shaili Deveshwar.   You may experience shorter wait times on Monday and Friday afternoons. The office is located at [address], [address], [place], [place] [address] No appointment is necessary.   Labs are drawn by [facility].  You may receive a bill from [facility] for your lab work. If you have any questions regarding directions or hours of operation,  please call [phone].    

## 2017-07-23 ENCOUNTER — Encounter: Payer: Self-pay | Admitting: Rheumatology

## 2017-07-26 ENCOUNTER — Other Ambulatory Visit: Payer: Self-pay | Admitting: Rheumatology

## 2017-07-26 NOTE — Telephone Encounter (Signed)
Last Visit: 07/21/17 Next Visit: 12/24/17 Labs: 06/11/17 Glucose is mildly elevated. ALT borderline elevated We will continue to monitor her LFTs.  TB Gold: 10/16/16 Neg   Okay to refill per Dr. Estanislado Pandy

## 2017-08-05 ENCOUNTER — Telehealth: Payer: Self-pay | Admitting: Neurology

## 2017-08-05 NOTE — Telephone Encounter (Signed)
[facility]@ office of Dr Glennon Hamilton office called stating pt informed them she has epilepsy.  Pt mentioned to them that Dr Jannifer Franklin mentioned putting her on a liquid medication for her epilepsy.  Otila Kluver states Dr Alvan Dame is wanting a seizure prevention medication.  Please call Otila Kluver at [phone]

## 2017-08-05 NOTE — Telephone Encounter (Signed)
I called the patient.  The patient will be having bariatric surgery in July, she needs a medication that can be given IV and in liquid form.  I will get her in for revisit, we will discuss a switch over to Vimpat.

## 2017-08-06 NOTE — Telephone Encounter (Signed)
Called and spoke w/ pt. Scheduled f/u for 08/18/17 at 1:30pm, check in 1:00pm. She verbalized understanding.

## 2017-08-18 ENCOUNTER — Ambulatory Visit (INDEPENDENT_AMBULATORY_CARE_PROVIDER_SITE_OTHER): Payer: 59 | Admitting: Neurology

## 2017-08-18 ENCOUNTER — Encounter: Payer: Self-pay | Admitting: Neurology

## 2017-08-18 VITALS — BP 110/65 | HR 76 | Ht 62.0 in | Wt 294.5 lb

## 2017-08-18 DIAGNOSIS — G43009 Migraine without aura, not intractable, without status migrainosus: Secondary | ICD-10-CM | POA: Diagnosis not present

## 2017-08-18 DIAGNOSIS — G40209 Localization-related (focal) (partial) symptomatic epilepsy and epileptic syndromes with complex partial seizures, not intractable, without status epilepticus: Secondary | ICD-10-CM | POA: Diagnosis not present

## 2017-08-18 MED ORDER — LACOSAMIDE 50 MG PO TABS: 150.0000 mg | ORAL_TABLET | Freq: Two times a day (BID) | ORAL | 2 refills | Status: DC

## 2017-08-18 NOTE — Patient Instructions (Signed)
   With the Vimpat 50 mg tablet start one tablet twice a day for 2 weeks, then take 2 tablets twice a day for 2 weeks, then take 3 tablets twice a day.  When you start Vimpat 100 mg twice a day, reduce the Trokendi to 2 at night for 2 weeks, then one at night for 2 weeks, then stop.

## 2017-08-18 NOTE — Progress Notes (Signed)
Reason for visit: Seizures  Tiffany Wilkins is an 52 y.o. female  History of present illness:  Tiffany Wilkins is a 52 year old right-handed white female with a history of morbid obesity, psoriatic arthritis, and history of seizures.  The patient has done relatively well with her seizures, she did have one brief aura in April 2019 when she was under stress and she missed several doses of her medication.  The patient has been back on the medication and she has done well.  She does operate a motor vehicle without difficulty.  She is planning to have bariatric surgery on September 22, 2017.  The patient needs to have a medication that she can get IV and a medication that has a liquid form.  The plans are to switch her to Vimpat and get her off of Trokendi at least temporarily.  The patient returns to the office for this purpose.  Past Medical History:  Diagnosis Date  . Allergic rhinitis, cause unspecified   . Anal fissure   . Arthritis    inflammatory and psoriatic--Dr. Estanislado Pandy  . Asthma    related to allergies and colds  . Chronic bronchitis    gets bronchitis yearly with colds  . Chronic kidney disease kidney stones  . Fracture    stress fracture right foot  . GERD (gastroesophageal reflux disease)   . Hernia (acquired) (recurrent)    R lower abdomen; has seen [facility] (Dr. Zena Amos and recurred  . Hiatal hernia 03/2011  . Impaired fasting glucose   . Internal hemorrhoids 03/2011  . Menstrual migraine    Dr, Jannifer Franklin  . Obesity   . Reflux esophagitis 03/2011  . Seizure disorder ([place])    f/b Dr. Jannifer Franklin  . Seizures ([place]) last seisure june 2012  . Sleep apnea    inconclusive test per pt (some central and obstructive component)  . Sleep apnea 06/07/09   mild complex sleep apnea, worse in supine position (uses CPAP intermittently only)  . Tinea cruris 7/09    Past Surgical History:  Procedure Laterality Date  . CESAREAN SECTION     x2  . CHOLECYSTECTOMY  2005  . COLONOSCOPY  03/19/11     Dr. Olevia Perches; internal hemorrhoids  . ESOPHAGOGASTRODUODENOSCOPY  03/19/11   hiatal hernia, esophagitis  . KIDNEY STONE SURGERY  08/2010   retrieved with basket (at [facility])  . repair of incisional hernia  2005, 2008   related to laparoscopic cholecystectomy    Family History  Problem Relation Age of Onset  . Arthritis Mother        rheumatoid  . Hyperlipidemia Mother   . Hypertension Mother   . Hyperthyroidism Mother        s/p thyroidectomy, now with hypothyroidism  . Cancer Father 27       AML, lung cancer, kidney cancer (all presented at same time)  . Diabetes Father   . Hypertension Father   . Hyperlipidemia Father   . Psoriasis Father   . Anxiety disorder Father        OCD, nervous breakdown  . Psoriasis Sister   . Irritable bowel syndrome Sister   . Arthritis Sister        psoriatic  . Diabetes Sister   . GER disease Daughter   . Asthma Son   . Asthma Maternal Grandmother   . Hyperlipidemia Maternal Grandmother   . Heart disease Maternal Grandmother   . Stroke Maternal Grandmother   . Diabetes Maternal Grandfather   . Heart  disease Maternal Grandfather   . Cancer Paternal Grandmother 74       female (?ovarian)  . Diabetes Paternal Grandmother   . Hypertension Paternal Grandmother   . Diabetes Paternal Grandfather   . Heart disease Paternal Grandfather   . Hypertension Paternal Grandfather   . Diabetes Paternal Uncle   . Crohn's disease Paternal Uncle   . Cancer Paternal Uncle        ? type  . Colitis Paternal Uncle   . Stroke Maternal Aunt   . Stroke Cousin   . Esophageal cancer Neg Hx   . Stomach cancer Neg Hx     Social history:  reports that she has never smoked. She has never used smokeless tobacco. She reports that she does not drink alcohol or use drugs.    Allergies  Allergen Reactions  . Cosentyx [Secukinumab] Anaphylaxis  . Hydrocodone Itching and Palpitations  . Amoxicillin Hives  . Codeine Itching and Other (See Comments)    Heart  races.  . Keppra [Levetiracetam]     Suicide ideation  . Ketorolac Tromethamine Other (See Comments)    Stopped breathing.  . Sulfa Antibiotics Nausea Only  . Transderm-Scop [Scopolamine] Other (See Comments)    Stopped breathing.    Medications:  Prior to Admission medications   Medication Sig Start Date End Date Taking? Authorizing Provider  albuterol (PROVENTIL HFA;VENTOLIN HFA) 108 (90 BASE) MCG/ACT inhaler Inhale 2 puffs into the lungs as needed.     Yes [provider]  clobetasol cream (TEMOVATE) 1.91 % Apply 1 application topically 2 (two) times daily. 07/21/17  Yes [name], PA-C  diclofenac sodium (VOLTAREN) 1 % GEL Apply 3 g to 3 large joints up to 3 times daily. 07/21/17  Yes [name], PA-C  fluconazole (DIFLUCAN) 150 MG tablet Take 1 tablet (150 mg) by mouth every 72 hours for 10-14 days if you develop recurrent yeast infection 07/21/17  Yes [name], PA-C  folic acid (FOLVITE) 1 MG tablet Take 2 tablets (2 mg total) by mouth daily. 07/02/16 09/25/17 Yes Panwala, Naitik, PA-C  ibuprofen (ADVIL,MOTRIN) 200 MG tablet Take 400 mg by mouth every 6 (six) hours as needed.    Yes [provider]  metFORMIN (GLUCOPHAGE) 500 MG tablet Take by mouth 2 (two) times daily. 03/02/17  Yes [provider]  methocarbamol (ROBAXIN) 500 MG tablet Take 1 tablet (500 mg total) by mouth daily as needed for muscle spasms. 09/28/16  Yes Garald Balding, MD  methotrexate (RHEUMATREX) 2.5 MG tablet TAKE 8 TABLETS WEEKLY 07/05/17  Yes Deveshwar, Abel Presto, MD  Multiple Minerals-Vitamins (CALCIUM-MAGNESIUM-ZINC-D3) TABS Take 2 tablets by mouth daily.   Yes [provider]  Naproxen Sodium 220 MG CAPS Take 1 capsule by mouth as needed.    Yes [provider]  Mayo 42 MG/0.5ML SOAJ INJECT ONE PEN SUBCUTANEOUSLY ONCE EVERY MONTH. BRING TO ROOM TEMP PRIOR TO ADMINISTRATION. REFRIGERATE. DO NOT FREEZE. 07/26/17  Yes Deveshwar, Abel Presto, MD  Topiramate ER  (TROKENDI XR) 50 MG CP24 Take 3 capsules by mouth at bedtime. 10/12/16  Yes Kathrynn Ducking, MD  lacosamide (VIMPAT) 50 MG TABS tablet Take 3 tablets (150 mg total) by mouth 2 (two) times daily. 08/18/17   Kathrynn Ducking, MD    ROS:  Out of a complete 14 system review of symptoms, the patient complains only of the following symptoms, and all other reviewed systems are negative.  Fatigue Incontinence of the bladder Joint pain, joint swelling, walking  difficulty Memory loss, tremors  Blood pressure 110/65, pulse 76, height 5\' 2"  (1.575 m), weight 294 lb 8 oz (133.6 kg).  Physical Exam  General: The patient is alert and cooperative at the time of the examination.  The patient is markedly obese.  Skin: No significant peripheral edema is noted.   Neurologic Exam  Mental status: The patient is alert and oriented x 3 at the time of the examination. The patient has apparent normal recent and remote memory, with an apparently normal attention span and concentration ability.   Cranial nerves: Facial symmetry is present. Speech is normal, no aphasia or dysarthria is noted. Extraocular movements are full. Visual fields are full.  Motor: The patient has good strength in all 4 extremities.  Sensory examination: Soft touch sensation is symmetric on the face, arms, and legs.  Coordination: The patient has good finger-nose-finger and heel-to-shin bilaterally.  Gait and station: The patient has a slightly wide-based gait, the patient walk without assistance.  Romberg is negative. No drift is seen.  Reflexes: Deep tendon reflexes are symmetric.   Assessment/Plan:  1.  Seizures  2.  Morbid obesity  The patient will be having bariatric surgery in the near future.  The patient will go on Vimpat taking 50 mg twice daily for 2 weeks and then go to 100 mg twice daily for 2 weeks and then take 150 mg twice daily.  When the patient reaches a dose of 100 mg twice daily, she will cut back on the  Trokendi to 2 tablets at night for 2 weeks then go to 1 tablet at night for 2 weeks and then stop.  The patient will follow-up in 6 months, she will call for the liquid form of Vimpat once she has surgery.  She may get IV medication in the hospital.  Jill Alexanders MD 08/18/2017 1:54 PM  [facility] [address] [place], [place] [address]  Phone [phone] Fax [phone]

## 2017-08-24 ENCOUNTER — Other Ambulatory Visit: Payer: Self-pay | Admitting: Neurology

## 2017-08-24 MED ORDER — PHENYTOIN SODIUM EXTENDED 100 MG PO CAPS: 300.0000 mg | ORAL_CAPSULE | Freq: Every day | ORAL | 3 refills | Status: DC

## 2017-08-24 NOTE — Telephone Encounter (Signed)
Pt requesting a call to discuss lacosamide (VIMPAT) 50 MG TABS tablet, stating that this medication has made her itch all over. Also stating that this mediation is in the family with Keppra, pt states that keppra has "messed with her head and caused suicidal behavior" Pleas call to discuss

## 2017-08-24 NOTE — Telephone Encounter (Signed)
I called the patient.  The patient is itching from the Vimpat, she will have to stop the medication.  I will try Dilantin as this as an IV preparation, oral suspension, and oral capsules.  She has taken Dilantin before and she knows that she can tolerate this drug.

## 2017-08-26 ENCOUNTER — Encounter: Payer: Self-pay | Admitting: Neurology

## 2017-08-26 ENCOUNTER — Telehealth: Payer: Self-pay | Admitting: Neurology

## 2017-08-26 NOTE — Telephone Encounter (Addendum)
Dr. Jannifer Franklin- are you able to write a mychart message to pt so she can give to Dr. Alvan Dame?   She would also like you to addend your last note to show you changed her medication from Vimpat to Dilantin.  Thank you

## 2017-08-26 NOTE — Telephone Encounter (Addendum)
Called pt. Advised d/t HIPAA law we cannot fax anything to Dr. Osie Cheeks unless we have a signed form from her stating we can due so. Offered to print office note for her to pick up and bring to his office. She declined stating she cannot miss anymore work.   She requested AVS on mychart be addended to state she is taking Dilantin not the Vimpat. She had SE from Vimpat. I advised we cannot do this. Stated we can addend last OV note and she can pick up. She declined. Wants Dr. Jannifer Franklin to send her a mychart message to advise on changes to her medication and recommendations for her to send to Dr. Osie Cheeks for her to be able to have surgery.  She would like release form placed up front in case mychart message not sufficient. She will pick up tomorrow if needed.

## 2017-08-26 NOTE — Telephone Encounter (Signed)
Pt requesting a call, stating Dr. Cleda Daub will need the updated list of medication from the previous office. So they can get everything settled for pts upcoming surgery. Please call to advise

## 2017-08-26 NOTE — Telephone Encounter (Signed)
The patient did not do well on the Vimpat, we had to change her to Dilantin, we will be placing her on 300 mg at night, we will check levels in a few weeks.  I will be happy to send a note to Dr. Osie Cheeks regarding this.

## 2017-09-12 ENCOUNTER — Telehealth: Payer: Self-pay | Admitting: Neurology

## 2017-09-12 DIAGNOSIS — Z5181 Encounter for therapeutic drug level monitoring: Secondary | ICD-10-CM

## 2017-09-12 NOTE — Telephone Encounter (Signed)
I called the patient.  She is to come in for a Dilantin level, we will make adjustments to the dosing depending upon the results of the blood work.

## 2017-09-13 ENCOUNTER — Other Ambulatory Visit (INDEPENDENT_AMBULATORY_CARE_PROVIDER_SITE_OTHER): Payer: Self-pay

## 2017-09-13 ENCOUNTER — Other Ambulatory Visit: Payer: Self-pay | Admitting: Neurology

## 2017-09-13 DIAGNOSIS — Z5181 Encounter for therapeutic drug level monitoring: Secondary | ICD-10-CM

## 2017-09-13 DIAGNOSIS — Z0289 Encounter for other administrative examinations: Secondary | ICD-10-CM

## 2017-09-14 ENCOUNTER — Telehealth: Payer: Self-pay | Admitting: Neurology

## 2017-09-14 LAB — PHENYTOIN LEVEL, TOTAL: PHENYTOIN (DILANTIN), SERUM: 6.4 ug/mL — AB (ref 10.0–20.0)

## 2017-09-14 MED ORDER — PHENYTOIN 50 MG PO CHEW: 50.0000 mg | CHEWABLE_TABLET | Freq: Every day | ORAL | 3 refills | Status: DC

## 2017-09-14 NOTE — Telephone Encounter (Signed)
I called patient.  Her Dilantin level 6.8, we need a level to be between 10 and 20, I will add a 50 mg tablet to the Dilantin dosing of 300 mg daily.  The patient will be going for surgery on 10 July.  The patient will be tapering off of Trokendi.  She will be off this within the next week.  Dilantin comes in an IV preparation, oral preparation with capsules or tablets, and also has a suspension.

## 2017-09-16 ENCOUNTER — Other Ambulatory Visit: Payer: Self-pay | Admitting: Rheumatology

## 2017-09-17 ENCOUNTER — Other Ambulatory Visit: Payer: Self-pay

## 2017-09-17 DIAGNOSIS — Z79899 Other long term (current) drug therapy: Secondary | ICD-10-CM

## 2017-09-17 LAB — CBC WITH DIFFERENTIAL/PLATELET
BASOS ABS: 58 {cells}/uL (ref 0–200)
Basophils Relative: 0.9 %
EOS PCT: 0.6 %
Eosinophils Absolute: 38 cells/uL (ref 15–500)
HEMATOCRIT: 40.8 % (ref 35.0–45.0)
Hemoglobin: 13.7 g/dL (ref 11.7–15.5)
LYMPHS ABS: 2694 {cells}/uL (ref 850–3900)
MCH: 30 pg (ref 27.0–33.0)
MCHC: 33.6 g/dL (ref 32.0–36.0)
MCV: 89.5 fL (ref 80.0–100.0)
MPV: 9.6 fL (ref 7.5–12.5)
Monocytes Relative: 8.5 %
NEUTROS ABS: 3066 {cells}/uL (ref 1500–7800)
NEUTROS PCT: 47.9 %
Platelets: 383 10*3/uL (ref 140–400)
RBC: 4.56 10*6/uL (ref 3.80–5.10)
RDW: 14.3 % (ref 11.0–15.0)
Total Lymphocyte: 42.1 %
WBC mixed population: 544 cells/uL (ref 200–950)
WBC: 6.4 10*3/uL (ref 3.8–10.8)

## 2017-09-17 LAB — COMPLETE METABOLIC PANEL WITH GFR
AG Ratio: 1.6 (calc) (ref 1.0–2.5)
ALBUMIN MSPROF: 4.4 g/dL (ref 3.6–5.1)
ALKALINE PHOSPHATASE (APISO): 97 U/L (ref 33–130)
ALT: 38 U/L — ABNORMAL HIGH (ref 6–29)
AST: 22 U/L (ref 10–35)
BUN: 14 mg/dL (ref 7–25)
CO2: 29 mmol/L (ref 20–32)
CREATININE: 0.58 mg/dL (ref 0.50–1.05)
Calcium: 9.5 mg/dL (ref 8.6–10.4)
Chloride: 100 mmol/L (ref 98–110)
GFR, EST AFRICAN AMERICAN: 124 mL/min/{1.73_m2} (ref >=60)
GFR, EST NON AFRICAN AMERICAN: 107 mL/min/{1.73_m2} (ref >=60)
GLOBULIN: 2.8 g/dL (ref 1.9–3.7)
Glucose, Bld: 96 mg/dL (ref 65–99)
Potassium: 4.3 mmol/L (ref 3.5–5.3)
SODIUM: 138 mmol/L (ref 135–146)
Total Bilirubin: 0.4 mg/dL (ref 0.2–1.2)
Total Protein: 7.2 g/dL (ref 6.1–8.1)

## 2017-09-17 MED ORDER — FOLIC ACID 1 MG PO TABS: 2.0000 mg | ORAL_TABLET | Freq: Every day | ORAL | 3 refills | Status: DC

## 2017-09-17 NOTE — Telephone Encounter (Addendum)
Last Visit: 07/21/17 Next Visit: 12/24/17 Labs: 06/11/17 Glucose is mildly elevated. ALT borderline elevated We will continue to monitor her LFTs.   Patient advised she is due for labs. She will come today to update labs.   Patient came in for labs  Okay to refill per Dr. Estanislado Pandy

## 2017-09-22 HISTORY — PX: BARIATRIC SURGERY: SHX1103

## 2017-09-23 ENCOUNTER — Telehealth: Payer: Self-pay | Admitting: Neurology

## 2017-09-23 NOTE — Telephone Encounter (Signed)
noted 

## 2017-09-23 NOTE — Telephone Encounter (Signed)
Dr Jeanine Luz [facility] [phone] requested an on Tuesday with Dr Jannifer Franklin, I advised her he is not in the office next week. She advised the pt had bariatric surgery yesterday and her phenytoin is a little low. Per Terrence Dupont pt can see Hoyle Sauer. Appt has been scheduled for 7/16 @ 2:45  FYI

## 2017-09-27 NOTE — Progress Notes (Signed)
[facility]  PATIENT: Tiffany Wilkins DOB: 12-15-1965   REASON FOR VISIT: Follow-up for seizure disorder recent bariatric surgery HISTORY FROM: Patient and husband    HISTORY OF PRESENT ILLNESS:UPDATE 7/16/2019CM [name], 52 year old female returns for follow-up with a history of morbid obesity seizure disorder and recent bariatric surgery.  She was switched to Vimpat at her last visit with Dr. Rodney Booze and took off Trokendi temporarily.  She had problems tolerating the Vimpat and was switched to Dilantin because it has a liquid form and IV form.  Her physician at [facility] Dr. Gordy Clement wanted her to be evaluated post surgery as they cannot get her Dilantin level therapeutic.  She was just discharged on the 12th.  She is currently taking 200 mg twice daily of liquid Dilantin.  Dilantin level on 09/13/2017 was 6.2 .She returns for reevaluation 6/5/19KWMs. Tavella is a 52 year old right-handed white female with a history of morbid obesity, psoriatic arthritis, and history of seizures.  The patient has done relatively well with her seizures, she did have one brief aura in April 2019 when she was under stress and she missed several doses of her medication.  The patient has been back on the medication and she has done well.  She does operate a motor vehicle without difficulty.  She is planning to have bariatric surgery on September 22, 2017.  The patient needs to have a medication that she can get IV and a medication that has a liquid form.  The plans are to switch her to Vimpat and get her off of Trokendi at least temporarily.  The patient returns to the office for this purpose.   REVIEW OF SYSTEMS: Full 14 system review of systems performed and notable only for those listed, all others are neg:  Constitutional: Activity change Cardiovascular: neg Ear/Nose/Throat: neg  Skin: neg Eyes: neg Respiratory: neg Gastroitestinal: urinary frequency, recent bariatric surgery    Hematology/Lymphatic: neg  Endocrine: neg Musculoskeletal: Walking difficulty joint pain Allergy/Immunology: neg Neurological: Seizure disorder history of migraines Psychiatric: neg Sleep : neg   ALLERGIES: Allergies  Allergen Reactions  . Cosentyx [Secukinumab] Anaphylaxis  . Hydrocodone Itching and Palpitations  . Vimpat [Lacosamide] Itching    Itching  . Amoxicillin Hives  . Codeine Itching and Other (See Comments)    Heart races.  . Keppra [Levetiracetam]     Suicide ideation  . Ketorolac Tromethamine Other (See Comments)    Stopped breathing.  . Sulfa Antibiotics Nausea Only  . Transderm-Scop [Scopolamine] Other (See Comments)    Stopped breathing.    HOME MEDICATIONS: Outpatient Medications Prior to Visit  Medication Sig Dispense Refill  . acetaminophen (TYLENOL) 500 MG tablet Hold this medication until finished taking oxycodone/acetaminophen.    . albuterol (PROVENTIL HFA;VENTOLIN HFA) 108 (90 BASE) MCG/ACT inhaler Inhale 2 puffs into the lungs as needed.      . clobetasol cream (TEMOVATE) 7.61 % Apply 1 application topically 2 (two) times daily. 45 g 0  . diclofenac sodium (VOLTAREN) 1 % GEL Apply 3 g to 3 large joints up to 3 times daily. 3 Tube 3  . fluconazole (DIFLUCAN) 150 MG tablet Take 1 tablet (150 mg) by mouth every 72 hours for 10-14 days if you develop recurrent yeast infection 5 tablet 1  . folic acid (FOLVITE) 1 MG tablet Take 2 tablets (2 mg total) by mouth daily. 180 tablet 3  . ibuprofen (ADVIL,MOTRIN) 200 MG tablet Take 400 mg by mouth every 6 (six) hours as needed.     Marland Kitchen  methocarbamol (ROBAXIN) 500 MG tablet Take 1 tablet (500 mg total) by mouth daily as needed for muscle spasms. 30 tablet 0  . methotrexate (RHEUMATREX) 2.5 MG tablet TAKE 8 TABLETS WEEKLY 96 tablet 0  . Multiple Minerals-Vitamins (CALCIUM-MAGNESIUM-ZINC-D3) TABS Take 2 tablets by mouth daily.    . Naproxen Sodium 220 MG CAPS Take 1 capsule by mouth as needed.     Marland Kitchen oxyCODONE (OXY  IR/ROXICODONE) 5 MG immediate release tablet Take 5 mg by mouth. Taking two daily    . pantoprazole (PROTONIX) 40 MG tablet Take 40 mg by mouth daily.  2  . phenytoin (DILANTIN) 125 MG/5ML suspension Take 200 mg by mouth 2 (two) times daily.    Marland Kitchen SIMPONI 3 MG/0.5ML SOAJ INJECT ONE PEN SUBCUTANEOUSLY ONCE EVERY MONTH. BRING TO ROOM TEMP PRIOR TO ADMINISTRATION. REFRIGERATE. DO NOT FREEZE. 3 Syringe 0  . phenytoin (DILANTIN) 100 MG ER capsule Take 3 capsules (300 mg total) by mouth at bedtime. (Patient not taking: Reported on 09/28/2017) 90 capsule 3  . phenytoin (DILANTIN) 50 MG tablet Chew 1 tablet (50 mg total) by mouth daily. (Patient not taking: Reported on 09/28/2017) 30 tablet 3  . promethazine (PHENERGAN) 25 MG tablet Not taking  0  . Topiramate ER (TROKENDI XR) 50 MG CP24 Take 3 capsules by mouth at bedtime. (Patient not taking: Reported on 09/28/2017) 270 capsule 3  . metFORMIN (GLUCOPHAGE) 500 MG tablet Take by mouth 2 (two) times daily.     No facility-administered medications prior to visit.     PAST MEDICAL HISTORY: Past Medical History:  Diagnosis Date  . Allergic rhinitis, cause unspecified   . Anal fissure   . Arthritis    inflammatory and psoriatic--Dr. Estanislado Pandy  . Asthma    related to allergies and colds  . Chronic bronchitis    gets bronchitis yearly with colds  . Chronic kidney disease kidney stones  . Fracture    stress fracture right foot  . GERD (gastroesophageal reflux disease)   . Hernia (acquired) (recurrent)    R lower abdomen; has seen [facility] (Dr. Zena Amos and recurred  . Hiatal hernia 03/2011  . Impaired fasting glucose   . Internal hemorrhoids 03/2011  . Menstrual migraine    Dr, Jannifer Franklin  . Obesity   . Reflux esophagitis 03/2011  . Seizure disorder ([place])    f/b Dr. Jannifer Franklin  . Seizures ([place]) last seisure june 2012  . Sleep apnea    inconclusive test per pt (some central and obstructive component)  . 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 Tinea cruris 7/09    PAST SURGICAL HISTORY: Past Surgical History:  Procedure Laterality Date  . BARIATRIC SURGERY  09/22/2017  . CESAREAN SECTION     x2  . CHOLECYSTECTOMY  2005  . COLONOSCOPY  03/19/11   Dr. Olevia Perches; internal hemorrhoids  . ESOPHAGOGASTRODUODENOSCOPY  03/19/11   hiatal hernia, esophagitis  . KIDNEY STONE SURGERY  08/2010   retrieved with basket (at [facility])  . repair of incisional hernia  2005, 2008   related to laparoscopic cholecystectomy    FAMILY HISTORY: Family History  Problem Relation Age of Onset  . Arthritis Mother        rheumatoid  . Hyperlipidemia Mother   . Hypertension Mother   . Hyperthyroidism Mother        s/p thyroidectomy, now with hypothyroidism  . Cancer Father 32  AML, lung cancer, kidney cancer (all presented at same time)  . Diabetes Father   . Hypertension Father   . Hyperlipidemia Father   . Psoriasis Father   . Anxiety disorder Father        OCD, nervous breakdown  . Psoriasis Sister   . Irritable bowel syndrome Sister   . Arthritis Sister        psoriatic  . Diabetes Sister   . GER disease Daughter   . Asthma Son   . Asthma Maternal Grandmother   . Hyperlipidemia Maternal Grandmother   . Heart disease Maternal Grandmother   . Stroke Maternal Grandmother   . Diabetes Maternal Grandfather   . Heart disease Maternal Grandfather   . Cancer Paternal Grandmother 77       female (?ovarian)  . Diabetes Paternal Grandmother   . Hypertension Paternal Grandmother   . Diabetes Paternal Grandfather   . Heart disease Paternal Grandfather   . Hypertension Paternal Grandfather   . Diabetes Paternal Uncle   . Crohn's disease Paternal Uncle   . Cancer Paternal Uncle        ? type  . Colitis Paternal Uncle   . Stroke Maternal Aunt   . Stroke Cousin   . Esophageal cancer Neg Hx   . Stomach cancer Neg Hx     SOCIAL HISTORY: Social History   Socioeconomic  History  . Marital status: Married    Spouse name: [name]  . Number of children: 2  . Years of education: hs  . Highest education level: Not on file  Occupational History  . Occupation: Academic librarian: [employer]  Social Needs  . Financial resource strain: Not on file  . Food insecurity:    Worry: Not on file    Inability: Not on file  . Transportation needs:    Medical: Not on file    Non-medical: Not on file  Tobacco Use  . Smoking status: Never Smoker  . Smokeless tobacco: Never Used  Substance and Sexual Activity  . Alcohol use: No    Alcohol/week: 0.0 oz    Comment: rarely  . Drug use: No  . Sexual activity: Yes    Partners: Male    Birth control/protection: Surgical    Comment: husband had vasectomy  Lifestyle  . Physical activity:    Days per week: Not on file    Minutes per session: Not on file  . Stress: Not on file  Relationships  . Social connections:    Talks on phone: Not on file    Gets together: Not on file    Attends religious service: Not on file    Active member of club or organization: Not on file    Attends meetings of clubs or organizations: Not on file    Relationship status: Not on file  . Intimate partner violence:    Fear of current or ex partner: Not on file    Emotionally abused: Not on file    Physically abused: Not on file    Forced sexual activity: Not on file  Other Topics Concern  . Not on file  Social History Narrative   Patient Lives with husband [name])  and 2 children (son and daughter, 15, 46)   Patient is right handed.   Patient has high school education.   Patient drinks 2 cups daily- coffee/soda              PHYSICAL EXAM  Vitals:  09/28/17 1430  BP: 113/74  Pulse: 71  Weight: 274 lb (124.3 kg)  Height: 5\' 2"  (1.575 m)   Body mass index is 50.12 kg/m.  Generalized: Well developed, morbidly obese female in no acute distress  Head: normocephalic and atraumatic,. Oropharynx benign  Neck: Supple,    Musculoskeletal: No deformity   Neurological examination   Mentation: Alert oriented to time, place, history taking. Attention span and concentration appropriate. Recent and remote memory intact.  Follows all commands speech and language fluent.   Cranial nerve II-XII: Pupils were equal round reactive to light extraocular movements were full, visual field were full on confrontational test. Facial sensation and strength were normal. hearing was intact to finger rubbing bilaterally. Uvula tongue midline. head turning and shoulder shrug were normal and symmetric.Tongue protrusion into cheek strength was normal. Motor: normal bulk and tone, full strength in the BUE, BLE,  Sensory: normal and symmetric to light touch,  Coordination: finger-nose-finger,  no dysmetria Reflexes: Symmetric upper and lower , plantar responses were flexor bilaterally. Gait and Station: Rising up from seated position without assistance, wide-based gait, patient ambulates with a walker no difficulty with turns   DIAGNOSTIC DATA (LABS, IMAGING, TESTING) - I reviewed patient records, labs, notes, testing and imaging myself where available.  Lab Results  Component Value Date   WBC 6.4 09/17/2017   HGB 13.7 09/17/2017   HCT 40.8 09/17/2017   MCV 89.5 09/17/2017   PLT 383 09/17/2017      Component Value Date/Time   NA 138 09/17/2017 1436   NA 140 03/18/2016 1531   K 4.3 09/17/2017 1436   CL 100 09/17/2017 1436   CO2 29 09/17/2017 1436   GLUCOSE 96 09/17/2017 1436   BUN 14 09/17/2017 1436   BUN 17 03/18/2016 1531   CREATININE 0.58 09/17/2017 1436   CALCIUM 9.5 09/17/2017 1436   PROT 7.2 09/17/2017 1436   PROT 7.4 03/18/2016 1531   ALBUMIN 4.2 10/16/2016 1343   ALBUMIN 4.5 03/18/2016 1531   AST 22 09/17/2017 1436   ALT 38 (H) 09/17/2017 1436   ALKPHOS 75 10/16/2016 1343   BILITOT 0.4 09/17/2017 1436   BILITOT 0.2 03/18/2016 1531   GFRNONAA 107 09/17/2017 1436   GFRAA 124 09/17/2017 1436   Lab Results   Component Value Date   CHOL 178 11/29/2013   HDL 52 11/29/2013   LDLCALC 102 (H) 11/29/2013   TRIG 122 11/29/2013   CHOLHDL 3.4 11/29/2013   Lab Results  Component Value Date   HGBA1C 5.7 01/22/2011   Lab Results  Component Value Date   VITAMINB12 280 06/14/2013   Lab Results  Component Value Date   TSH 2.390 06/14/2013      ASSESSMENT AND PLAN  52 y.o. year old female with a history of seizure disorder morbid obesity and recent bariatric surgery.  Patient had to be switched to liquid Dilantin and is currently taking 200 mg twice daily.  Recent Dilantin level 6.2 in the hospital.  Will check Dilantin level today Will  adjust Dilantin as necessary Call for any seizure Follow up as planned Dennie Bible, [facility], [facility], [facility] [address] [place], [place] [address] [phone]

## 2017-09-28 ENCOUNTER — Ambulatory Visit (INDEPENDENT_AMBULATORY_CARE_PROVIDER_SITE_OTHER): Payer: 59 | Admitting: Nurse Practitioner

## 2017-09-28 ENCOUNTER — Encounter: Payer: Self-pay | Admitting: Nurse Practitioner

## 2017-09-28 VITALS — BP 113/74 | HR 71 | Ht 62.0 in | Wt 274.0 lb

## 2017-09-28 DIAGNOSIS — G40209 Localization-related (focal) (partial) symptomatic epilepsy and epileptic syndromes with complex partial seizures, not intractable, without status epilepticus: Secondary | ICD-10-CM

## 2017-09-28 NOTE — Patient Instructions (Signed)
Will check Dilantin level today Will  adjust Dilantin as necessary Call for any seizure Follow up as planned

## 2017-09-29 ENCOUNTER — Telehealth: Payer: Self-pay | Admitting: *Deleted

## 2017-09-29 LAB — PHENYTOIN LEVEL, TOTAL: PHENYTOIN (DILANTIN), SERUM: 13.8 ug/mL (ref 10.0–20.0)

## 2017-09-29 NOTE — Telephone Encounter (Signed)
Received call back form patient. Advised her that her Dilantin level is in the therapeutic range. Please continue taking it as she has been. Inquired if she needs a refill. She stated she has 3-4 days left, so she does need a refill sent to [facility], [place]. This RN informed her that C Hassell Done will be notified of refill needed. She verbalized understanding, appreciation of call.

## 2017-09-29 NOTE — Telephone Encounter (Signed)
LVM requesting call back to discuss lab results.

## 2017-09-30 MED ORDER — PHENYTOIN 125 MG/5ML PO SUSP: 200.0000 mg | Freq: Two times a day (BID) | ORAL | 5 refills | Status: DC

## 2017-09-30 NOTE — Telephone Encounter (Signed)
She should stay on the liquid until her GI surgeon says she can take pills. Please refill

## 2017-09-30 NOTE — Telephone Encounter (Signed)
Per verbal order from Daun Peacock, NP, Dilantin suspension refilled, and Dilantin tablets not removed from her medication list. Patient is not taking Dilantin tablets at this time, only taking Dilantin suspension. She recently had gastric bypass surgery.

## 2017-10-08 ENCOUNTER — Telehealth: Payer: Self-pay | Admitting: Rheumatology

## 2017-10-08 NOTE — Telephone Encounter (Signed)
I called patient.  She states that she is having a flare of her arthritis.  She restarted Simponi and methotrexate  about 1-1/2-week ago.  She is continues to have a lot of pain and swelling in her joints.  She states she had clearance from her surgeon to restart her medications 1 week after the surgery.  I have advised her to contact her surgeon's office and see if she can take prednisone.  If she gets clearance from the surgeon's office then we can call in prednisone 20 mg p.o. daily and taper it by 5 mg every 4 days.

## 2017-10-08 NOTE — Telephone Encounter (Signed)
Patient called stating she called her surgeon and he said it was okay for her to take Prednisone.  Patient requests it be sent to [facility] on [address] in [place].

## 2017-10-08 NOTE — Telephone Encounter (Signed)
Patient called stating she went to the ER last night thinking she broke her right foot and had it x-rayed.  She was told that the x-ray showed degeneration consistent with arthritis.  Patient was prescribed muscle relaxers and told to contact Dr. Estanislado Pandy.  Patient states she is non-weight bearing and having to use a walker to get around.  Patient states her flair-ups have gotten worse since she stopped taking her Methotrexate and Simponi because of her surgery on the 10th.  Patient states she has a Simponi injection at home and is asking if she can take it.  Patient requests a return call.

## 2017-10-11 MED ORDER — PREDNISONE 5 MG PO TABS: 5.0000 mg | ORAL_TABLET | Freq: Every day | ORAL | 0 refills | Status: DC

## 2017-10-11 NOTE — Telephone Encounter (Signed)
See previous phone

## 2017-10-11 NOTE — Telephone Encounter (Signed)
Patient gave a return call stating her surgeon gave the okay for her to have prednisone. Prescription has been sent into the pharmacy. Left message to advise patient.

## 2017-10-12 ENCOUNTER — Other Ambulatory Visit: Payer: Self-pay | Admitting: *Deleted

## 2017-10-12 MED ORDER — GOLIMUMAB 50 MG/0.5ML ~~LOC~~ SOAJ: SUBCUTANEOUS | 0 refills | Status: DC

## 2017-10-12 NOTE — Telephone Encounter (Signed)
Refill request received via fax:   Last Visit: 07/21/17 Next Visit: 12/22/17 Labs: 09/17/17 We will continue to monitor. All other labs are WNL TB Gold: 10/16/16 Neg   Okay to refill per Dr. Estanislado Pandy

## 2017-10-13 ENCOUNTER — Ambulatory Visit: Payer: 59 | Admitting: Neurology

## 2017-10-27 ENCOUNTER — Telehealth: Payer: Self-pay | Admitting: Rheumatology

## 2017-10-27 MED ORDER — PREDNISONE 5 MG PO TABS: ORAL_TABLET | ORAL | 0 refills | Status: DC

## 2017-10-27 NOTE — Telephone Encounter (Signed)
Okay to call in prednisone taper as prescribed before per patient's request.

## 2017-10-27 NOTE — Telephone Encounter (Signed)
Patient has taken all of Prednisone for feet swelling post surgery. Patient states she still has swelling with her feet. Patient unable to drive due to this. Patient walking with a walker also. Patient questions another dose. Patient has started back on MTX since 7/26. Simponi injection is scheduled for 8/22. Please call to advise. Patient uses [facility] on [address]

## 2017-10-27 NOTE — Telephone Encounter (Signed)
Prescription has been sent to the pharmacy. Patient has been notified.

## 2017-11-05 ENCOUNTER — Telehealth: Payer: Self-pay | Admitting: Rheumatology

## 2017-11-05 NOTE — Telephone Encounter (Signed)
Patient needs Rx for MTX sent to [facility]. Patient has enough for this week, due Friday 8/30.

## 2017-11-05 NOTE — Telephone Encounter (Signed)
Patient advised that we sent a prescription to the pharmacy for a 90 day supply on 09/17/17. Patient states they do not have the prescription on file. Contacted the pharmacy on behalf of the patient. They advised that prescription was mailed to patient and delivered on 09/24/17. Contacted the patient and advised. She states she has not been staying at home she has been at her mother's house. Patient will contact her spouse to ask about prescription.

## 2017-11-12 ENCOUNTER — Telehealth: Payer: Self-pay | Admitting: Rheumatology

## 2017-11-12 MED ORDER — METHOTREXATE 2.5 MG PO TABS: ORAL_TABLET | ORAL | 0 refills | Status: DC

## 2017-11-12 NOTE — Telephone Encounter (Signed)
Last Visit: 07/21/17 Next Visit: 12/22/17 Labs: 09/17/17 We will continue to monitor. All other labs are WNL  Okay to refill per Dr. Estanislado Pandy

## 2017-11-12 NOTE — Telephone Encounter (Signed)
Patient left a voicemail requesting prescription refill of Methotrexate.   Patient states she will run out of medication next week.

## 2017-11-16 ENCOUNTER — Telehealth: Payer: Self-pay | Admitting: Pharmacy Technician

## 2017-11-16 ENCOUNTER — Ambulatory Visit (INDEPENDENT_AMBULATORY_CARE_PROVIDER_SITE_OTHER): Payer: 59 | Admitting: Rheumatology

## 2017-11-16 ENCOUNTER — Encounter: Payer: Self-pay | Admitting: Rheumatology

## 2017-11-16 VITALS — BP 117/68 | HR 83 | Resp 16 | Ht 62.0 in | Wt 255.0 lb

## 2017-11-16 DIAGNOSIS — Z8261 Family history of arthritis: Secondary | ICD-10-CM

## 2017-11-16 DIAGNOSIS — Z1589 Genetic susceptibility to other disease: Secondary | ICD-10-CM

## 2017-11-16 DIAGNOSIS — Z8719 Personal history of other diseases of the digestive system: Secondary | ICD-10-CM

## 2017-11-16 DIAGNOSIS — M25562 Pain in left knee: Secondary | ICD-10-CM | POA: Diagnosis not present

## 2017-11-16 DIAGNOSIS — Z8669 Personal history of other diseases of the nervous system and sense organs: Secondary | ICD-10-CM

## 2017-11-16 DIAGNOSIS — Z79899 Other long term (current) drug therapy: Secondary | ICD-10-CM

## 2017-11-16 DIAGNOSIS — Z8639 Personal history of other endocrine, nutritional and metabolic disease: Secondary | ICD-10-CM

## 2017-11-16 DIAGNOSIS — L4050 Arthropathic psoriasis, unspecified: Secondary | ICD-10-CM | POA: Diagnosis not present

## 2017-11-16 DIAGNOSIS — L409 Psoriasis, unspecified: Secondary | ICD-10-CM | POA: Diagnosis not present

## 2017-11-16 DIAGNOSIS — Z9889 Other specified postprocedural states: Secondary | ICD-10-CM

## 2017-11-16 DIAGNOSIS — Z8709 Personal history of other diseases of the respiratory system: Secondary | ICD-10-CM

## 2017-11-16 DIAGNOSIS — Z9049 Acquired absence of other specified parts of digestive tract: Secondary | ICD-10-CM

## 2017-11-16 MED ORDER — PREDNISONE 5 MG PO TABS: ORAL_TABLET | ORAL | 0 refills | Status: DC

## 2017-11-16 MED ORDER — TRIAMCINOLONE ACETONIDE 40 MG/ML IJ SUSP
60.0000 mg | INTRAMUSCULAR | Status: AC | PRN
  Administered 2017-11-16: 60 mg via INTRA_ARTICULAR

## 2017-11-16 MED ORDER — LIDOCAINE HCL 1 % IJ SOLN
1.5000 mL | INTRAMUSCULAR | Status: AC | PRN
  Administered 2017-11-16: 1.5 mL

## 2017-11-16 NOTE — Patient Instructions (Addendum)
Standing Labs We placed an order today for your standing lab work.    Please come back and get your standing labs in October and then every 3 months. TB Gold is due in October  We have open lab Monday through Friday from 8:30-11:30 AM and 1:30-4:00 PM  at the office of Dr. Bo Merino.   You may experience shorter wait times on Monday and Friday afternoons. The office is located at [address], [place], [place], [place] [address] No appointment is necessary.   Labs are drawn by [facility].  You may receive a bill from [facility] for your lab work. If you have any questions regarding directions or hours of operation,  please call [phone].    Vaccines You are taking a medication(s) that can suppress your immune system.  The following immunizations are recommended:  . Flu annually . Pneumonia . Shingles . Hepatitis B  Please check with your PCP to make sure you are up to date.  In addition to staying up to date on lab work and vaccines it is important to:  Marland Kitchen Yearly skin checks with dermatologist due to increase risk in skin cancer with TNF inhibitors .

## 2017-11-16 NOTE — Telephone Encounter (Signed)
Received a Prior Authorization request from [employer] for [employer]. Authorization has been submitted to patient's insurance via Cover My Meds. Will update once we receive a response.  4:00 PM Vianey Caniglia Delice Lesch

## 2017-11-16 NOTE — Progress Notes (Signed)
Office Visit Note  Patient: Tiffany Wilkins             Date of Birth: 15-Oct-1965           MRN: [id number]             PCP: Rita Ohara, MD Referring: Rita Ohara, MD Visit Date: 11/16/2017 Occupation: '@GUAROCC'$ @  Subjective:  Left knee pain.   History of Present Illness: Tiffany Wilkins is a 52 y.o. female with history of psoriatic arthritis and psoriasis.  She had to come off Simponi and methotrexate for her gastric sleeve surgery for 1 month.  She resumed both medications 1 week after the surgery.  She has not had such a good response and she has been off medication.  She has been having increased pain and discomfort.  She has had 2 courses of prednisone taper.  She states when she comes below 15 mg of prednisone her joints started hurting.  She is having severe pain and discomfort in her left knee joint and having difficulty walking.  She has been using a walker.  She has not been able to return to work.  She has swelling in her bilateral hands, bilateral knee joints, bilateral ankles and her feet.  Activities of Daily Living:  Patient reports morning stiffness for all day hours.   Patient Reports nocturnal pain.  Difficulty dressing/grooming: Reports Difficulty climbing stairs: Reports Difficulty getting out of chair: Reports Difficulty using hands for taps, buttons, cutlery, and/or writing: Denies  Review of Systems  Constitutional: Positive for fatigue. Negative for night sweats, weight gain and weight loss.  HENT: Negative for mouth sores, trouble swallowing, trouble swallowing, mouth dryness and nose dryness.   Eyes: Negative for pain, redness, visual disturbance and dryness.  Respiratory: Negative for cough, shortness of breath and difficulty breathing.   Cardiovascular: Negative for chest pain, palpitations, hypertension, irregular heartbeat and swelling in legs/feet.  Gastrointestinal: Positive for constipation. Negative for blood in stool and diarrhea.  Endocrine: Negative  for increased urination.  Genitourinary: Negative for vaginal dryness.  Musculoskeletal: Positive for arthralgias, joint pain, joint swelling and morning stiffness. Negative for myalgias, muscle weakness, muscle tenderness and myalgias.  Skin: Positive for rash. Negative for color change, hair loss, skin tightness, ulcers and sensitivity to sunlight.  Allergic/Immunologic: Negative for susceptible to infections.  Neurological: Negative for dizziness, memory loss, night sweats and weakness.  Hematological: Negative for swollen glands.  Psychiatric/Behavioral: Negative for depressed mood and sleep disturbance. The patient is not nervous/anxious.     PMFS History:  Patient Active Problem List   Diagnosis Date Noted  . Primary osteoarthritis of both knees 12/11/2016  . High risk medication use 06/30/2016  . Right hand pain 06/30/2016  . Right hip pain 06/30/2016  . Hepatic steatosis 06/27/2014  . BMI 50.0-59.9, adult ([place]) 06/27/2014  . Fibroid, uterine 06/27/2014  . Abdominal pain, chronic, right lower quadrant 06/27/2014  . Obesity hypoventilation syndrome ([place]) 12/08/2013  . Nocturnal seizures ([place]) 12/08/2013  . OSA (obstructive sleep apnea) 12/08/2013  . Noncompliance with CPAP treatment 12/08/2013  . Psoriasis 11/29/2013  . Seizure disorder, complex partial ([place]) 06/01/2012  . Migraine 06/01/2012  . OSA on CPAP 01/04/2012  . Hiatal hernia 03/26/2011  . Internal hemorrhoid 03/24/2011  . Reflux esophagitis 03/24/2011  . Psoriatic arthritis ([place]) 01/22/2011  . Hernia of abdominal wall 01/22/2011    Past Medical History:  Diagnosis Date  . Allergic rhinitis, cause unspecified   . Anal fissure   . Arthritis  inflammatory and psoriatic--Dr. Estanislado Pandy  . Asthma    related to allergies and colds  . Chronic bronchitis    gets bronchitis yearly with colds  . Chronic kidney disease kidney stones  . Fracture    stress fracture right foot  . GERD (gastroesophageal reflux  disease)   . Hernia (acquired) (recurrent)    R lower abdomen; has seen [facility] (Dr. Zena Amos and recurred  . Hiatal hernia 03/2011  . Impaired fasting glucose   . Internal hemorrhoids 03/2011  . Menstrual migraine    Dr, Jannifer Franklin  . Obesity   . Reflux esophagitis 03/2011  . Seizure disorder ([place])    f/b Dr. Jannifer Franklin  . Seizures ([place]) last seisure june 2012  . Sleep apnea    inconclusive test per pt (some central and obstructive component)  . Sleep apnea 06/07/09   mild complex sleep apnea, worse in supine position (uses CPAP intermittently only)  . Tinea cruris 7/09    Family History  Problem Relation Age of Onset  . Arthritis Mother        rheumatoid  . Hyperlipidemia Mother   . Hypertension Mother   . Hyperthyroidism Mother        s/p thyroidectomy, now with hypothyroidism  . Cancer Father 78       AML, lung cancer, kidney cancer (all presented at same time)  . Diabetes Father   . Hypertension Father   . Hyperlipidemia Father   . Psoriasis Father   . Anxiety disorder Father        OCD, nervous breakdown  . Psoriasis Sister   . Irritable bowel syndrome Sister   . Arthritis Sister        psoriatic  . Diabetes Sister   . GER disease Daughter   . Asthma Son   . Asthma Maternal Grandmother   . Hyperlipidemia Maternal Grandmother   . Heart disease Maternal Grandmother   . Stroke Maternal Grandmother   . Diabetes Maternal Grandfather   . Heart disease Maternal Grandfather   . Cancer Paternal Grandmother 46       female (?ovarian)  . Diabetes Paternal Grandmother   . Hypertension Paternal Grandmother   . Diabetes Paternal Grandfather   . Heart disease Paternal Grandfather   . Hypertension Paternal Grandfather   . Diabetes Paternal Uncle   . Crohn's disease Paternal Uncle   . Cancer Paternal Uncle        ? type  . Colitis Paternal Uncle   . Stroke Maternal Aunt   . Stroke Cousin   . Esophageal cancer Neg Hx   . Stomach cancer Neg Hx    Past Surgical History:    Procedure Laterality Date  . BARIATRIC SURGERY  09/22/2017  . CESAREAN SECTION     x2  . CHOLECYSTECTOMY  2005  . COLONOSCOPY  03/19/11   Dr. Olevia Perches; internal hemorrhoids  . ESOPHAGOGASTRODUODENOSCOPY  03/19/11   hiatal hernia, esophagitis  . KIDNEY STONE SURGERY  08/2010   retrieved with basket (at [facility])  . repair of incisional hernia  2005, 2008   related to laparoscopic cholecystectomy   Social History   Social History Narrative   Patient Lives with husband [name])  and 2 children (son and daughter, 23, 39)   Patient is right handed.   Patient has high school education.   Patient drinks 2 cups daily- coffee/soda             Objective: Vital Signs: BP 117/68 (BP Location: Left Arm, Patient  Position: Sitting, Cuff Size: Large)   Pulse 83   Resp 16   Ht 5' 2"  (1.575 m)   Wt 255 lb (115.7 kg)   BMI 46.64 kg/m    Physical Exam  Constitutional: She is oriented to person, place, and time. She appears well-developed and well-nourished.  HENT:  Head: Normocephalic and atraumatic.  Eyes: Conjunctivae and EOM are normal.  Neck: Normal range of motion.  Cardiovascular: Normal rate, regular rhythm, normal heart sounds and intact distal pulses.  Pulmonary/Chest: Effort normal and breath sounds normal.  Abdominal: Soft. Bowel sounds are normal.  Lymphadenopathy:    She has no cervical adenopathy.  Neurological: She is alert and oriented to person, place, and time.  Skin: Skin is warm and dry. Capillary refill takes less than 2 seconds.  Psychiatric: She has a normal mood and affect. Her behavior is normal.  Nursing note and vitals reviewed.    Musculoskeletal Exam: C-spine thoracic and lumbar spine good range of motion.  Shoulder joints elbow joints with good range of motion.  She has dactylitis in several of her fingers as described below.  She has swelling in her both knees more prominent in her left knee joint.  She is swelling over bilateral ankles and swelling in  several of her toes as described below.  CDAI Exam: CDAI Score: 20  Patient Global Assessment: 10 (mm); Provider Global Assessment: 10 (mm) Swollen: 26 ; Tender: 26  Joint Exam      Right  Left  MCP 1  Swollen Tender     MCP 2     Swollen Tender  MCP 3     Swollen Tender  MCP 4     Swollen Tender  IP  Swollen Tender     PIP 2     Swollen Tender  PIP 3     Swollen Tender  PIP 4     Swollen Tender  DIP 2     Swollen Tender  DIP 3     Swollen Tender  DIP 4     Swollen Tender  Knee     Swollen Tender  Ankle  Swollen Tender  Swollen Tender  MTP 2  Swollen Tender  Swollen Tender  MTP 3  Swollen Tender     MTP 4  Swollen Tender     PIP 2 (toe)  Swollen Tender  Swollen Tender  PIP 3 (toe)  Swollen Tender     PIP 4 (toe)  Swollen Tender     DIP 2 (toe)  Swollen Tender  Swollen Tender  DIP 3 (toe)  Swollen Tender     DIP 4 (toe)  Swollen Tender        Investigation: No additional findings.  Imaging: No results found.  Recent Labs: Lab Results  Component Value Date   WBC 6.4 09/17/2017   HGB 13.7 09/17/2017   PLT 383 09/17/2017   NA 138 09/17/2017   K 4.3 09/17/2017   CL 100 09/17/2017   CO2 29 09/17/2017   GLUCOSE 96 09/17/2017   BUN 14 09/17/2017   CREATININE 0.58 09/17/2017   BILITOT 0.4 09/17/2017   ALKPHOS 75 10/16/2016   AST 22 09/17/2017   ALT 38 (H) 09/17/2017   PROT 7.2 09/17/2017   ALBUMIN 4.2 10/16/2016   CALCIUM 9.5 09/17/2017   GFRAA 124 09/17/2017    Speciality Comments: TB Gold: 10/16/16 Neg  Procedures:  Large Joint Inj: L knee on 11/16/2017 3:16 PM Indications: pain Details: 27 G 1.5 in needle,  medial approach  Arthrogram: No  Medications: 1.5 mL lidocaine 1 %; 60 mg triamcinolone acetonide 40 MG/ML Aspirate: 0 mL Outcome: tolerated well, no immediate complications Procedure, treatment alternatives, risks and benefits explained, specific risks discussed. Consent was given by the patient. Immediately prior to procedure a time out was called  to verify the correct patient, procedure, equipment, support staff and site/side marked as required. Patient was prepped and draped in the usual sterile fashion.     Allergies: Cosentyx [secukinumab]; Hydrocodone; Vimpat [lacosamide]; Amoxicillin; Codeine; Keppra [levetiracetam]; Ketorolac tromethamine; Sulfa antibiotics; and Transderm-scop [scopolamine]   Assessment / Plan:     Visit Diagnoses: Psoriatic arthritis (HCC)-patient is having a severe flare with pain and swelling in multiple joints as described above.  She is taken to prednisone taper without good response.  She was doing much better prior to her surgery on Simponi and methotrexate combination.  We will try if she can get subcu methotrexate.  Different treatment options were discussed.  With her history of seizure she is very hesitant to switch her medication.  She is tried Cosentyx in the past and had reaction.  I will give her another prednisone taper starting at 20 mg p.o. daily and taper by 5 mg every week.  Try to get a prior authorization on subcu methotrexate.  I will also research if there is any issues with Humira and increased risk of seizures.  Psoriasis-she has few psoriasis patches.  High risk medication use - Simponi SQ, MTX 8 tablets p.o. daily, folic acid 2 mg p.o. daily, prednisone 10 mg p.o. daily.  - Plan: QuantiFERON-TB Gold Plus in October with her next labs.  Her CBC and CMP have been stable.  Acute pain of left knee-he has severe pain and swelling in her left knee joint.  She is having difficulty walking even with the walker.  Today after informed consent was obtained left knee joint was injected with cortisone as described above.  HLA B27 positive  History of epilepsy - Dr. Jannifer Franklin    History of obesity-patient had gastric sleeve surgery and has had some intentional weight loss.  History of bronchitis  History of sleep apnea  History of hernia repair - x2  History of asthma  History of abdominal  hernia  History of cholecystectomy  Family history of rheumatoid arthritis - mother.    Orders: Orders Placed This Encounter  Procedures  . Large Joint Inj  . QuantiFERON-TB Gold Plus   Meds ordered this encounter  Medications  . predniSONE (DELTASONE) 5 MG tablet    Sig: Take 4 tabs po x7 days, 3 tabs po x7 days, 2 tabs po x7 days, 1 tab po x7 days.    Dispense:  70 tablet    Refill:  0    Face-to-face time spent with patient was 30 minutes. Greater than 50% of time was spent in counseling and coordination of care.  Follow-Up Instructions: Return in about 5 months (around 04/18/2018) for Psoriatic arthritis.   Bo Merino, MD  Note - This record has been created using Editor, commissioning.  Chart creation errors have been sought, but may not always  have been located. Such creation errors do not reflect on  the standard of medical care.

## 2017-11-18 ENCOUNTER — Telehealth: Payer: Self-pay | Admitting: Rheumatology

## 2017-11-18 NOTE — Telephone Encounter (Signed)
Called the pharmacy to clarify instructions and dosage. Attempted to call patient and left message to advise patient the instructions had been clarified and they will get the medication refilled.

## 2017-11-18 NOTE — Telephone Encounter (Signed)
Patient called stating the pharmacist at [facility] told her the dosage and instructions were missing on the prescription refill of Methotrexate that was sent to them.

## 2017-11-23 NOTE — Telephone Encounter (Signed)
Received a fax regarding Prior Authorization for Rasuvo. Authorization has been DENIED because Plan only approves for the following diagnosis: Rheumatoid Arthritis, Polyarticular idiopathic arthritis, or psoriasis.   Submitted an appeal to patient's plan as she also has psoriasis along with Psoriatic Arthritis. Awaiting response.  Will send document to scan center.  Phone# [phone]  1:54 PM Beatriz Chancellor, CPhT

## 2017-11-23 NOTE — Telephone Encounter (Signed)
Spoke to rep @ [facility] and was advised that Rasuvo is preferred. But also needs a prior authorization.   Received a Prior Authorization request from [facility] for Rasuvo 20mg . Authorization has been submitted to patient's insurance via Cover My Meds. Will update once we receive a response.  8:31 AM Beatriz Chancellor, CPhT

## 2017-12-01 ENCOUNTER — Other Ambulatory Visit: Payer: Self-pay | Admitting: Pharmacist

## 2017-12-01 DIAGNOSIS — L4050 Arthropathic psoriasis, unspecified: Secondary | ICD-10-CM

## 2017-12-01 MED ORDER — METHOTREXATE (PF) 20 MG/0.4ML ~~LOC~~ SOAJ: 20.0000 mg | SUBCUTANEOUS | 0 refills | Status: DC

## 2017-12-01 NOTE — Progress Notes (Signed)
New script for Rasuvo sent to [facility] pharmacy.

## 2017-12-01 NOTE — Telephone Encounter (Signed)
Received a fax from [place] regarding a prior authorization for Rasuvo 20mg . Authorization has been APPROVED from 10/31/17 to 12/02/19.   Will send document to scan center. Called patient to advise, left message.  Authorization # [id number] Phone # [phone]  [facility] per insurance.  11:24 AM Beatriz Chancellor, CPhT

## 2017-12-08 ENCOUNTER — Other Ambulatory Visit: Payer: Self-pay | Admitting: Rheumatology

## 2017-12-08 NOTE — Telephone Encounter (Signed)
Last Visit: 11/16/17 Next Visit: 02/16/18 Labs: 09/17/17 ALT is 38. We will continue to monitor. All other labs are WNL.  Okay to refill per Dr. Estanislado Pandy

## 2017-12-09 ENCOUNTER — Telehealth: Payer: Self-pay | Admitting: Rheumatology

## 2017-12-09 DIAGNOSIS — L4050 Arthropathic psoriasis, unspecified: Secondary | ICD-10-CM

## 2017-12-09 MED ORDER — METHOTREXATE (PF) 20 MG/0.4ML ~~LOC~~ SOAJ: 20.0000 mg | SUBCUTANEOUS | 0 refills | Status: DC

## 2017-12-09 MED ORDER — PREDNISONE 5 MG PO TABS: ORAL_TABLET | ORAL | 0 refills | Status: DC

## 2017-12-09 NOTE — Telephone Encounter (Signed)
Okay to increase prednisone to 10 mg p.o. daily for 1 week and then she can try decreasing to 5 mg p.o. daily.  We will also review Cosentyx or Taltz if there is any association with increased risk of epilepsy.

## 2017-12-09 NOTE — Telephone Encounter (Signed)
Patient advised [facility] Patient was provided with the contact information for the pharmacy. Patient offered to a sample. Patient will come to the office and pick up sample. Patient states she will stay on her simponi for now and research taltz. Prescription for Prednisone sent to the pharmacy.

## 2017-12-09 NOTE — Telephone Encounter (Signed)
Left message to advise patient [facility], it was sent to her local pharmacy by the pharmacist. Patient advised I would speak with Dr. Estanislado Pandy about continuing her Prednisone as she is still having trouble walking.    Patient is down to taking 5 mg of Prednisone daily. Patient is requesting to increase Prednisone as she is still having trouble walking. Please advise.

## 2017-12-09 NOTE — Telephone Encounter (Signed)
Patient called stating she received a letter from [facility] stating the Methotrexate injections have been approved, but are waiting on information from Dr. Arlean Hopping office. Patient states she has not received the injections yet and still taking the pills.  Patient also states that she is taking the Prednisone taper and when she gets down to 1 pill she cannot feel any effect and is unable to walk due to pain.  Patient is asking if Dr. Estanislado Pandy wants to prescribe more Prednisone until the injections are sent.  Patient requested a return call.

## 2017-12-10 ENCOUNTER — Other Ambulatory Visit: Payer: Self-pay | Admitting: Pharmacist

## 2017-12-10 DIAGNOSIS — L4050 Arthropathic psoriasis, unspecified: Secondary | ICD-10-CM

## 2017-12-10 DIAGNOSIS — L409 Psoriasis, unspecified: Secondary | ICD-10-CM

## 2017-12-10 MED ORDER — CLOBETASOL PROPIONATE 0.05 % EX CREA: 1.0000 "application " | TOPICAL_CREAM | Freq: Two times a day (BID) | CUTANEOUS | 0 refills | Status: DC

## 2017-12-10 NOTE — Progress Notes (Signed)
Patient presented to our office to obtain Rasuvo samples due to a delay in medication shipment.  Reviewed proper administration and storage.  Rasuvo pen is very similar to her current Symphony device and patient feels comfortable administrating at home.  Sample medications: Rasuvo 20 mg (2 boxes) NDC: 44010-272-53 Lot number: [id number] AB Expiration: September 2020  Patient also inquired about length of prednisone.  Wanted to know how long she should continue to take and if it was okay.  She is currently on a prednisone taper.  Instructed patient to give [place] a call after she finishes her current taper to reassess the need for prednisone.  All questions encouraged and answered.

## 2017-12-10 NOTE — Progress Notes (Signed)
Patient also stated that she is starting to have psoriasis flare on her stomach.  She asked for a refill on clobetasol cream.  Refill sent to her local pharmacy.

## 2017-12-10 NOTE — Addendum Note (Signed)
Addended by: Mariella Saa C on: 12/10/2017 01:53 PM   Modules accepted: Orders

## 2017-12-10 NOTE — Progress Notes (Signed)
Received fax from [facility] that the prescription for Rasuvo was received and they are looking at benefit verification.   Will send document to scan center.

## 2017-12-22 ENCOUNTER — Ambulatory Visit: Payer: 59 | Admitting: Rheumatology

## 2017-12-24 ENCOUNTER — Ambulatory Visit: Payer: 59 | Admitting: Rheumatology

## 2017-12-29 ENCOUNTER — Telehealth: Payer: Self-pay | Admitting: Rheumatology

## 2017-12-29 MED ORDER — PREDNISONE 5 MG PO TABS: 10.0000 mg | ORAL_TABLET | Freq: Every day | ORAL | 0 refills | Status: DC

## 2017-12-29 NOTE — Telephone Encounter (Signed)
Ok to increase prednisone to 10 mg by mouth daily to bridge her until the sq MTX starts working. Please only send in a 30 day supply of Prednisone.  If she continues to have joint pain and joint swelling despite being on Prednisone 10 mg by mouth daily please advise patient to schedule a sooner office visit.

## 2017-12-29 NOTE — Telephone Encounter (Signed)
Patient called requesting prescription refill of Prednisone to be sent to [facility] on [address] in [place].  Patient states she just switched to the Methotrexate injections and took her 3rd shot.  Patient states she was told it could take up to 12 weeks before she will notice a change.  Patient states she is now down to 1 pill with her Prednisone taper which offers her no relief.

## 2017-12-29 NOTE — Telephone Encounter (Signed)
Patient states she just switched to the Methotrexate injections and took her 3rd shot. Patient states she was told it could take up to 12 weeks before she will notice a change.  Patient states she has been on a Prednisone taper and when she gets to Prednisone 5 mg daily her pain and swelling with bilateral, knees, ankles, hands and shoulders.  Patient states the injectable  MTX has not started working. Patient states she has been taking Ibuprofen 600 mg BID daily to help with pain. Patient was seen in the office on 11/16/17 with a follow up on 02/16/18. Patient is asking for an increase in her Prednisone until the MTX begins to work. Please advise.

## 2017-12-29 NOTE — Telephone Encounter (Signed)
Patient advised to increase Prednisone to 10 mg daily. Patient advised if she continues to have joint pain and joint swelling despite being on Prednisone 10 mg by mouth daily  She will need to schedule a sooner office visit.

## 2018-01-03 ENCOUNTER — Other Ambulatory Visit: Payer: Self-pay | Admitting: Rheumatology

## 2018-01-03 NOTE — Telephone Encounter (Addendum)
Last Visit: 11/16/17 Next Visit: 02/16/18 Labs: 09/17/17 ALT is 38. We will continue to monitor. All other labs are WNL. TB Gold: 10/16/16 Neg   Patient advised she is due to update labs.    Okay to refill 30 day supply Simponi?

## 2018-01-03 NOTE — Telephone Encounter (Signed)
ok 

## 2018-01-04 ENCOUNTER — Telehealth: Payer: Self-pay | Admitting: Rheumatology

## 2018-01-04 NOTE — Telephone Encounter (Signed)
Patient called stating she had labwork done with Dr. Elaina Hoops at [facility] the first week of October and will call their office and have them fax the results to our office.

## 2018-01-04 NOTE — Telephone Encounter (Signed)
Advised patient we have not received lab results but will notify her once they are received. Patient verbalized understanding.

## 2018-01-05 ENCOUNTER — Telehealth: Payer: Self-pay | Admitting: *Deleted

## 2018-01-05 NOTE — Telephone Encounter (Signed)
We will continue to monitor.

## 2018-01-05 NOTE — Telephone Encounter (Signed)
Received lab results patient had drawn with weight management center at [facility]. Results can be viewed in [place].  Platelets are elevated 419 ALT elevated 54 Glucose elevated 108  Rest of labs WNL  Patient is on Simponi 50 mg/0.5 mL monthly Rasuvo 20 mg/0.4 mL

## 2018-01-18 ENCOUNTER — Other Ambulatory Visit: Payer: Self-pay | Admitting: Neurology

## 2018-01-28 ENCOUNTER — Telehealth: Payer: Self-pay | Admitting: Pharmacist

## 2018-01-28 ENCOUNTER — Telehealth: Payer: Self-pay

## 2018-01-28 ENCOUNTER — Other Ambulatory Visit: Payer: Self-pay | Admitting: Rheumatology

## 2018-01-28 DIAGNOSIS — L4050 Arthropathic psoriasis, unspecified: Secondary | ICD-10-CM

## 2018-01-28 MED ORDER — PREDNISONE 5 MG PO TABS: ORAL_TABLET | ORAL | 0 refills | Status: DC

## 2018-01-28 NOTE — Telephone Encounter (Signed)
Asked to research medication options for patient as she is not adequately controlled on Simponi and is fearful of starting any new medications that could potential be linked to epilepsy.

## 2018-01-28 NOTE — Telephone Encounter (Signed)
Patient requested refill of prednisone 10mg . Patient states she believes the Rasuvo is working but does not want to decrease prednisone any further because she is afraid of a flare. Patient is on both Simponi and Rasuvo. Patient has upcoming appointment on 02/16/2018.  Please advise.

## 2018-01-28 NOTE — Telephone Encounter (Signed)
Called patient to review prednisone prescription directions.  Instructed patient that it will be a taper instead of 10 mg daily.  Patient verbalized understanding.  Informed patient that we were looking at other medication options since she has had an adequate response with Symphony and methotrexate.  Also stated that we may consult her neurologist to discuss medication options that will not affect her seizure disorder.  Patient does not want to change medications at this time.  She states that she is doing much better since her last office visit and is able to use a cane some days instead of relying on a walker.  She feels like she is still improving after switching to subq methotrexate even with decreasing her prednisone dose.  Congratulated patient on the improvement.  Instructed patient we will still look at medication options just in case and will reevaluate at her next office visit in December.  Patient is also due for labs and reviewed lab hours.  All questions encouraged and answered.  Instructed patient to call with any other questions or concerns.

## 2018-01-28 NOTE — Telephone Encounter (Signed)
Advised patient she is due to update TB Gold. CBC and CMP can be viewed in Care Everywhere. Patient states she will come in for TB gold next week.

## 2018-01-28 NOTE — Telephone Encounter (Signed)
Last Visit: 11/16/17 Next Visit: 02/16/18 Labs: 7/5/19ALT is 38.We will continue to monitor. All other labs are WNL. TB Gold: 10/16/16 Neg

## 2018-01-28 NOTE — Telephone Encounter (Signed)
Labs in Care Everywhere 12/21/17  Platelets are elevated 419 ALT elevated 54 Glucose elevated 108  Rest of labs WNL

## 2018-01-28 NOTE — Telephone Encounter (Signed)
Ok to give Pred 10mg  x1wk then taper by 2.5 mg po q wk

## 2018-02-02 NOTE — Progress Notes (Signed)
Office Visit Note  Patient: Tiffany Wilkins             Date of Birth: 1965/10/10           MRN: [id number]             PCP: Rita Ohara, MD Referring: Rita Ohara, MD Visit Date: 02/16/2018 Occupation: '@GUAROCC'$ @  Subjective:  Pain in multiple joints.   History of Present Illness: Tiffany Wilkins is a 52 y.o. female with history of psoriatic arthritis and psoriasis.  She states she continues to have joint discomfort.  Been having joint swelling in her hands, knee joints and feet.  She states her psoriasis is worse and she had her surgery for gastric sleeve.  She has been experiencing a lot of fatigue as well.  Patient states that she has been unable to work since July 2019 due to initially because of surgery had noted to severe arthritis.  She has been using a walker for mobility.  She states she is very tired and goes off to sleep during the daytime.  Due to severe swelling in her hands knees and ankles she is not able to do much.  Activities of Daily Living:  Patient reports morning stiffness for all day hours.   Patient Reports nocturnal pain.  Difficulty dressing/grooming: Reports Difficulty climbing stairs: Reports Difficulty getting out of chair: Reports Difficulty using hands for taps, buttons, cutlery, and/or writing: Reports  Review of Systems  Constitutional: Positive for fatigue. Negative for night sweats, weight gain and weight loss.  HENT: Negative for mouth sores, trouble swallowing, trouble swallowing, mouth dryness and nose dryness.   Eyes: Negative for pain, redness, visual disturbance and dryness.  Respiratory: Negative for cough, shortness of breath and difficulty breathing.   Cardiovascular: Negative for chest pain, palpitations, hypertension, irregular heartbeat and swelling in legs/feet.  Gastrointestinal: Positive for constipation. Negative for blood in stool and diarrhea.  Endocrine: Negative for increased urination.  Genitourinary: Negative for vaginal dryness.   Musculoskeletal: Positive for arthralgias, joint pain, joint swelling, myalgias, morning stiffness and myalgias. Negative for muscle weakness and muscle tenderness.  Skin: Positive for rash. Negative for color change, hair loss, skin tightness, ulcers and sensitivity to sunlight.  Allergic/Immunologic: Negative for susceptible to infections.  Neurological: Positive for seizures. Negative for dizziness, memory loss, night sweats and weakness.  Hematological: Negative for swollen glands.  Psychiatric/Behavioral: Positive for depressed mood and sleep disturbance. The patient is not nervous/anxious.     PMFS History:  Patient Active Problem List   Diagnosis Date Noted  . Primary osteoarthritis of both knees 12/11/2016  . High risk medication use 06/30/2016  . Right hand pain 06/30/2016  . Right hip pain 06/30/2016  . Hepatic steatosis 06/27/2014  . BMI 50.0-59.9, adult ([place]) 06/27/2014  . Fibroid, uterine 06/27/2014  . Abdominal pain, chronic, right lower quadrant 06/27/2014  . Obesity hypoventilation syndrome ([place]) 12/08/2013  . Nocturnal seizures ([place]) 12/08/2013  . OSA (obstructive sleep apnea) 12/08/2013  . Noncompliance with CPAP treatment 12/08/2013  . Psoriasis 11/29/2013  . Seizure disorder, complex partial ([place]) 06/01/2012  . Migraine 06/01/2012  . OSA on CPAP 01/04/2012  . Hiatal hernia 03/26/2011  . Internal hemorrhoid 03/24/2011  . Reflux esophagitis 03/24/2011  . Psoriatic arthritis ([place]) 01/22/2011  . Hernia of abdominal wall 01/22/2011    Past Medical History:  Diagnosis Date  . Allergic rhinitis, cause unspecified   . Anal fissure   . Arthritis    inflammatory and psoriatic--Dr. Estanislado Pandy  .  Asthma    related to allergies and colds  . Chronic bronchitis    gets bronchitis yearly with colds  . Chronic kidney disease kidney stones  . Fracture    stress fracture right foot  . GERD (gastroesophageal reflux disease)   . Hernia (acquired) (recurrent)    R lower  abdomen; has seen [facility] (Dr. Zena Amos and recurred  . Hiatal hernia 03/2011  . Impaired fasting glucose   . Internal hemorrhoids 03/2011  . Menstrual migraine    Dr, Jannifer Franklin  . Obesity   . Reflux esophagitis 03/2011  . Seizure disorder ([place])    f/b Dr. Jannifer Franklin  . Seizures ([place]) last seisure june 2012  . Sleep apnea    inconclusive test per pt (some central and obstructive component)  . Sleep apnea 06/07/09   mild complex sleep apnea, worse in supine position (uses CPAP intermittently only)  . Tinea cruris 7/09    Family History  Problem Relation Age of Onset  . Arthritis Mother        rheumatoid  . Hyperlipidemia Mother   . Hypertension Mother   . Hyperthyroidism Mother        s/p thyroidectomy, now with hypothyroidism  . Cancer Father 46       AML, lung cancer, kidney cancer (all presented at same time)  . Diabetes Father   . Hypertension Father   . Hyperlipidemia Father   . Psoriasis Father   . Anxiety disorder Father        OCD, nervous breakdown  . Psoriasis Sister   . Irritable bowel syndrome Sister   . Arthritis Sister        psoriatic  . Diabetes Sister   . GER disease Daughter   . Asthma Son   . Asthma Maternal Grandmother   . Hyperlipidemia Maternal Grandmother   . Heart disease Maternal Grandmother   . Stroke Maternal Grandmother   . Diabetes Maternal Grandfather   . Heart disease Maternal Grandfather   . Cancer Paternal Grandmother 42       female (?ovarian)  . Diabetes Paternal Grandmother   . Hypertension Paternal Grandmother   . Diabetes Paternal Grandfather   . Heart disease Paternal Grandfather   . Hypertension Paternal Grandfather   . Diabetes Paternal Uncle   . Crohn's disease Paternal Uncle   . Cancer Paternal Uncle        ? type  . Colitis Paternal Uncle   . Stroke Maternal Aunt   . Stroke Cousin   . Esophageal cancer Neg Hx   . Stomach cancer Neg Hx    Past Surgical History:  Procedure Laterality Date  . BARIATRIC SURGERY   09/22/2017  . CESAREAN SECTION     x2  . CHOLECYSTECTOMY  2005  . COLONOSCOPY  03/19/11   Dr. Olevia Perches; internal hemorrhoids  . ESOPHAGOGASTRODUODENOSCOPY  03/19/11   hiatal hernia, esophagitis  . KIDNEY STONE SURGERY  08/2010   retrieved with basket (at [facility])  . repair of incisional hernia  2005, 2008   related to laparoscopic cholecystectomy   Social History   Social History Narrative   Patient Lives with husband [name])  and 2 children (son and daughter, 54, 68)   Patient is right handed.   Patient has high school education.   Patient drinks 2 cups daily- coffee/soda             Objective: Vital Signs: BP 110/74 (BP Location: Left Arm, Patient Position: Sitting, Cuff Size: Large)  Pulse 78   Resp 15   Ht 5' 2"  (1.575 m)   Wt 232 lb (105.2 kg)   BMI 42.43 kg/m    Physical Exam  Constitutional: She is oriented to person, place, and time. She appears well-developed and well-nourished.  HENT:  Head: Normocephalic and atraumatic.  Eyes: Conjunctivae and EOM are normal.  Neck: Normal range of motion.  Cardiovascular: Normal rate, regular rhythm, normal heart sounds and intact distal pulses.  Pulmonary/Chest: Effort normal and breath sounds normal.  Abdominal: Soft. Bowel sounds are normal.  Lymphadenopathy:    She has no cervical adenopathy.  Neurological: She is alert and oriented to person, place, and time.  Skin: Skin is warm and dry. Capillary refill takes less than 2 seconds. Rash noted.  Psoriasis on ears, trunk and lower extremities.  Psychiatric: She has a normal mood and affect. Her behavior is normal.  Nursing note and vitals reviewed.    Musculoskeletal Exam: C-spine thoracic lumbar spine limited range of motion.  Shoulder joints were in good range of motion with discomfort.  She has dactylitis in several of her digits as described below.  She has warmth and swelling in her left knee joint and left ankle.   CDAI Exam: CDAI Score: 21.4  Patient Global  Assessment: 7 (mm); Provider Global Assessment: 7 (mm) Swollen: 13 ; Tender: 15  Joint Exam      Right  Left  Glenohumeral   Tender   Tender  MCP 1  Swollen Tender  Swollen Tender  IP  Swollen Tender  Swollen Tender  PIP 2     Swollen Tender  PIP 3     Swollen Tender  PIP 4     Swollen Tender  DIP 2     Swollen Tender  DIP 3     Swollen Tender  DIP 4     Swollen Tender  Knee  Swollen Tender  Swollen Tender  Ankle     Swollen Tender     Investigation: No additional findings.  Imaging: No results found.  Recent Labs: Lab Results  Component Value Date   WBC 6.4 09/17/2017   HGB 13.7 09/17/2017   PLT 383 09/17/2017   NA 138 09/17/2017   K 4.3 09/17/2017   CL 100 09/17/2017   CO2 29 09/17/2017   GLUCOSE 96 09/17/2017   BUN 14 09/17/2017   CREATININE 0.58 09/17/2017   BILITOT 0.4 09/17/2017   ALKPHOS 75 10/16/2016   AST 22 09/17/2017   ALT 38 (H) 09/17/2017   PROT 7.2 09/17/2017   ALBUMIN 4.2 10/16/2016   CALCIUM 9.5 09/17/2017   GFRAA 124 09/17/2017   QFTBGOLDPLUS NEGATIVE 02/03/2018    Speciality Comments: TB Gold: 10/16/16 Neg Failed Cosentyx-developed rash that sent her to the ER Does not want to try TNF-inhibitor due to neurologic risk since she has epilepsy  Procedures:  No procedures performed Allergies: Cosentyx [secukinumab]; Hydrocodone; Vimpat [lacosamide]; Amoxicillin; Codeine; Keppra [levetiracetam]; Ketorolac tromethamine; Sulfa antibiotics; and Transderm-scop [scopolamine]   Assessment / Plan:     Visit Diagnoses: Psoriatic arthritis (HCC)-patient has severe arthritis with dactylitis involving multiple joints as described above.  She is having problems with mobility.  She is unable to work since July.  She is planning to apply for disability.  She is having a flare currently despite being on prednisone and Simponi.  Psoriasis-she has new lesions of psoriasis.  High risk medication use - Simponi subcu monthly, Rasuvo 20 mg subcu weekly, folic acid,  prednisone 5 mg p.o.  daily. (Cosentyx in the past and had reaction.)  Patient's labs were stable in October except for elevated liver function.  We will continue to monitor labs.  We had detailed discussion regarding Humira.  At this point patient is concerned about the increased risk of seizures with Biologics.  I have advised her to discuss this further with Dr. Juleen China if he approves Humira I would like to switch her from Simponi to Humira.  She requested a prescription refill for Voltaren gel which was given.  Use of NSAIDs and Tylenol was discouraged.  Patient has been using CBD oil.  HLA B27 positive  History of epilepsy - Dr. Jannifer Franklin    History of cholecystectomy  History of abdominal hernia  History of hernia repair - x2  History of asthma  History of bronchitis  History of obesity-patient had gastric sleeve surgery and had intentional weight loss since then.  History of sleep apnea-after the weight loss she has not been using CPAP.  Family history of rheumatoid arthritis - mother.     Orders: No orders of the defined types were placed in this encounter.  Meds ordered this encounter  Medications  . diclofenac sodium (VOLTAREN) 1 % GEL    Sig: Apply 3 g to 3 large joints up to 3 times daily.    Dispense:  3 Tube    Refill:  3      Follow-Up Instructions: Return in about 3 months (around 05/18/2018) for Psoriatic arthritis, Ps.   Bo Merino, MD  Note - This record has been created using Editor, commissioning.  Chart creation errors have been sought, but may not always  have been located. Such creation errors do not reflect on  the standard of medical care.

## 2018-02-03 ENCOUNTER — Other Ambulatory Visit: Payer: Self-pay

## 2018-02-03 DIAGNOSIS — Z79899 Other long term (current) drug therapy: Secondary | ICD-10-CM

## 2018-02-05 LAB — QUANTIFERON-TB GOLD PLUS
Mitogen-NIL: >10 IU/mL
NIL: 0.03 IU/mL
QuantiFERON-TB Gold Plus: NEGATIVE
TB1-NIL: 0 [IU]/mL
TB2-NIL: 0 IU/mL

## 2018-02-07 ENCOUNTER — Other Ambulatory Visit: Payer: Self-pay | Admitting: Rheumatology

## 2018-02-08 NOTE — Telephone Encounter (Signed)
Last Visit: 11/16/17 Next Visit: 02/16/18 Labs:12/21/17 Platelets are elevated 419, ALT elevated 54 Glucose elevated 108 Rest of labs WNL TB Gold: 02/03/18 Neg   Okay to refill per Dr. Estanislado Pandy

## 2018-02-15 ENCOUNTER — Other Ambulatory Visit: Payer: Self-pay | Admitting: Rheumatology

## 2018-02-15 DIAGNOSIS — L4050 Arthropathic psoriasis, unspecified: Secondary | ICD-10-CM

## 2018-02-15 NOTE — Telephone Encounter (Signed)
Last Visit: 11/16/17 Next Visit: 02/16/18 Labs:12/21/17 Platelets are elevated 419, ALT elevated 54 Glucose elevated 108 Rest of labs WNL  Okay to refill per Dr. Estanislado Pandy

## 2018-02-16 ENCOUNTER — Ambulatory Visit (INDEPENDENT_AMBULATORY_CARE_PROVIDER_SITE_OTHER): Payer: 59 | Admitting: Rheumatology

## 2018-02-16 ENCOUNTER — Encounter: Payer: Self-pay | Admitting: Rheumatology

## 2018-02-16 VITALS — BP 110/74 | HR 78 | Resp 15 | Ht 62.0 in | Wt 232.0 lb

## 2018-02-16 DIAGNOSIS — L409 Psoriasis, unspecified: Secondary | ICD-10-CM | POA: Diagnosis not present

## 2018-02-16 DIAGNOSIS — Z1589 Genetic susceptibility to other disease: Secondary | ICD-10-CM

## 2018-02-16 DIAGNOSIS — Z8261 Family history of arthritis: Secondary | ICD-10-CM

## 2018-02-16 DIAGNOSIS — Z79899 Other long term (current) drug therapy: Secondary | ICD-10-CM | POA: Diagnosis not present

## 2018-02-16 DIAGNOSIS — Z8709 Personal history of other diseases of the respiratory system: Secondary | ICD-10-CM

## 2018-02-16 DIAGNOSIS — Z8639 Personal history of other endocrine, nutritional and metabolic disease: Secondary | ICD-10-CM

## 2018-02-16 DIAGNOSIS — Z9049 Acquired absence of other specified parts of digestive tract: Secondary | ICD-10-CM

## 2018-02-16 DIAGNOSIS — Z9889 Other specified postprocedural states: Secondary | ICD-10-CM

## 2018-02-16 DIAGNOSIS — L4050 Arthropathic psoriasis, unspecified: Secondary | ICD-10-CM

## 2018-02-16 DIAGNOSIS — Z8719 Personal history of other diseases of the digestive system: Secondary | ICD-10-CM

## 2018-02-16 DIAGNOSIS — Z8669 Personal history of other diseases of the nervous system and sense organs: Secondary | ICD-10-CM

## 2018-02-16 MED ORDER — DICLOFENAC SODIUM 1 % TD GEL: TRANSDERMAL | 3 refills | Status: AC

## 2018-02-16 NOTE — Patient Instructions (Signed)
Standing Labs We placed an order today for your standing lab work.    Please come back and get your standing labs in January and every 3 months   We have open lab Monday through Friday from 8:30-11:30 AM and 1:30-4:00 PM  at the office of Dr. Darlyne Schmiesing.   You may experience shorter wait times on Monday and Friday afternoons. The office is located at [address], [address], [place],  [address] No appointment is necessary.   Labs are drawn by [facility].  You may receive a bill from [facility] for your lab work. If you have any questions regarding directions or hours of operation,  please call [phone].   Just as a reminder please drink plenty of water prior to coming for your lab work. Thanks!   

## 2018-02-23 ENCOUNTER — Encounter: Payer: Self-pay | Admitting: Neurology

## 2018-02-23 ENCOUNTER — Ambulatory Visit (INDEPENDENT_AMBULATORY_CARE_PROVIDER_SITE_OTHER): Payer: 59 | Admitting: Neurology

## 2018-02-23 VITALS — BP 116/72 | HR 66 | Ht 62.0 in | Wt 229.0 lb

## 2018-02-23 DIAGNOSIS — G43009 Migraine without aura, not intractable, without status migrainosus: Secondary | ICD-10-CM | POA: Diagnosis not present

## 2018-02-23 DIAGNOSIS — G40209 Localization-related (focal) (partial) symptomatic epilepsy and epileptic syndromes with complex partial seizures, not intractable, without status epilepticus: Secondary | ICD-10-CM | POA: Diagnosis not present

## 2018-02-23 MED ORDER — TOPIRAMATE ER 50 MG PO CAP24: 3.0000 | ORAL_CAPSULE | Freq: Every day | ORAL | 1 refills | Status: DC

## 2018-02-23 NOTE — Progress Notes (Signed)
Reason for visit: Seizures  Tiffany Wilkins is an 52 y.o. female  History of present illness:  Tiffany Wilkins is a 52 year old right-handed white female with a history of obesity and psoriatic arthritis and a history of seizures.  The patient has had bariatric surgery, she has not been able to take capsules or tablets until just recently, she is on Dilantin instead of Trokendi because of this.  The patient is on 400 mg daily of Dilantin currently, she seemed to be tolerating this fairly well.  The patient indicates that Trokendi helped her seizures and her migraine headaches.  She was on 150 mg at night prior to the surgery.  The patient is having occasional migraines, she has been on a prednisone taper as her psoriatic arthritis has significantly worsened following surgery.  She has been able to lose some weight with the surgery.  Past Medical History:  Diagnosis Date  . Allergic rhinitis, cause unspecified   . Anal fissure   . Arthritis    inflammatory and psoriatic--Dr. Estanislado Pandy  . Asthma    related to allergies and colds  . Chronic bronchitis    gets bronchitis yearly with colds  . Chronic kidney disease kidney stones  . Fracture    stress fracture right foot  . GERD (gastroesophageal reflux disease)   . Hernia (acquired) (recurrent)    R lower abdomen; has seen [facility] (Dr. Zena Amos and recurred  . Hiatal hernia 03/2011  . Impaired fasting glucose   . Internal hemorrhoids 03/2011  . Menstrual migraine    Dr, Jannifer Franklin  . Obesity   . Reflux esophagitis 03/2011  . Seizure disorder ([place])    f/b Dr. Jannifer Franklin  . Seizures ([place]) last seisure june 2012  . Sleep apnea    inconclusive test per pt (some central and obstructive component)  . Sleep apnea 06/07/09   mild complex sleep apnea, worse in supine position (uses CPAP intermittently only)  . Tinea cruris 7/09    Past Surgical History:  Procedure Laterality Date  . BARIATRIC SURGERY  09/22/2017  . CESAREAN SECTION     x2  .  CHOLECYSTECTOMY  2005  . COLONOSCOPY  03/19/11   Dr. Olevia Perches; internal hemorrhoids  . ESOPHAGOGASTRODUODENOSCOPY  03/19/11   hiatal hernia, esophagitis  . KIDNEY STONE SURGERY  08/2010   retrieved with basket (at [facility])  . repair of incisional hernia  2005, 2008   related to laparoscopic cholecystectomy    Family History  Problem Relation Age of Onset  . Arthritis Mother        rheumatoid  . Hyperlipidemia Mother   . Hypertension Mother   . Hyperthyroidism Mother        s/p thyroidectomy, now with hypothyroidism  . Cancer Father 28       AML, lung cancer, kidney cancer (all presented at same time)  . Diabetes Father   . Hypertension Father   . Hyperlipidemia Father   . Psoriasis Father   . Anxiety disorder Father        OCD, nervous breakdown  . Psoriasis Sister   . Irritable bowel syndrome Sister   . Arthritis Sister        psoriatic  . Diabetes Sister   . GER disease Daughter   . Asthma Son   . Asthma Maternal Grandmother   . Hyperlipidemia Maternal Grandmother   . Heart disease Maternal Grandmother   . Stroke Maternal Grandmother   . Diabetes Maternal Grandfather   . Heart disease  Maternal Grandfather   . Cancer Paternal Grandmother 47       female (?ovarian)  . Diabetes Paternal Grandmother   . Hypertension Paternal Grandmother   . Diabetes Paternal Grandfather   . Heart disease Paternal Grandfather   . Hypertension Paternal Grandfather   . Diabetes Paternal Uncle   . Crohn's disease Paternal Uncle   . Cancer Paternal Uncle        ? type  . Colitis Paternal Uncle   . Stroke Maternal Aunt   . Stroke Cousin   . Esophageal cancer Neg Hx   . Stomach cancer Neg Hx     Social history:  reports that she has never smoked. She has never used smokeless tobacco. She reports that she drank alcohol. She reports that she does not use drugs.    Allergies  Allergen Reactions  . Cosentyx [Secukinumab] Anaphylaxis  . Hydrocodone Itching and Palpitations  . Vimpat  [Lacosamide] Itching    Itching  . Amoxicillin Hives  . Codeine Itching and Other (See Comments)    Heart races.  . Keppra [Levetiracetam]     Suicide ideation  . Ketorolac Tromethamine Other (See Comments)    Stopped breathing.  . Sulfa Antibiotics Nausea Only  . Transderm-Scop [Scopolamine] Other (See Comments)    Stopped breathing.    Medications:  Prior to Admission medications   Medication Sig Start Date End Date Taking? Authorizing Provider  acetaminophen (TYLENOL) 500 MG tablet Hold this medication until finished taking oxycodone/acetaminophen. 09/24/17  Yes [provider]  albuterol (PROVENTIL HFA;VENTOLIN HFA) 108 (90 BASE) MCG/ACT inhaler Inhale 2 puffs into the lungs as needed.     Yes [provider]  BIOTIN PO Take by mouth daily.   Yes [provider]  CALCIUM PO Take by mouth daily.   Yes [provider]  clobetasol cream (TEMOVATE) 2.69 % Apply 1 application topically 2 (two) times daily. 12/10/17  Yes Deveshwar, Abel Presto, MD  Cyanocobalamin (VITAMIN B-12 PO) Take by mouth daily.   Yes [provider]  diclofenac sodium (VOLTAREN) 1 % GEL Apply 3 g to 3 large joints up to 3 times daily. 02/16/18  Yes Deveshwar, Abel Presto, MD  fluconazole (DIFLUCAN) 150 MG tablet Take 1 tablet (150 mg) by mouth every 72 hours for 10-14 days if you develop recurrent yeast infection Patient taking differently: as needed. Take 1 tablet (150 mg) by mouth every 72 hours for 10-14 days if you develop recurrent yeast infection 07/21/17  Yes [name], PA-C  folic acid (FOLVITE) 1 MG tablet Take 2 tablets (2 mg total) by mouth daily. 09/17/17  Yes Deveshwar, Abel Presto, MD  ibuprofen (ADVIL,MOTRIN) 200 MG tablet Take 400 mg by mouth every 6 (six) hours as needed.    Yes [provider]  Multiple Minerals-Vitamins (CALCIUM-MAGNESIUM-ZINC-D3) TABS Take 2 tablets by mouth daily.   Yes [provider]  OVER THE COUNTER MEDICATION daily.   Yes  [provider]  pantoprazole (PROTONIX) 40 MG tablet Take 40 mg by mouth daily. 09/24/17  Yes [provider]  phenytoin (DILANTIN) 100 MG ER capsule TAKE 3 CAPSULES(300 MG) BY MOUTH AT BEDTIME 01/18/18  Yes Kathrynn Ducking, MD  promethazine (PHENERGAN) 25 MG tablet Not taking 09/24/17  Yes [provider]  RASUVO 20 MG/0.4ML SOAJ INJECT ONE PEN SUBCUTANEOUSLY ONCE EVERY WEEK. STORE AT ROOM TEMPERATURE BETWEEN 68 - 49 DEGREES F. 02/15/18  Yes Deveshwar, Abel Presto, MD  SENNA PO Take by mouth as needed.   Yes [provider]  SIMPONI 36 MG/0.5ML SOAJ INJECT ONE PEN SUBCUTANEOUSLY ONCE EVERY MONTH. BRING TO ROOM TEMP PRIOR TO ADMINISTRATION. REFRIGERATE. DO NOT FREEZE. 02/08/18  Yes Deveshwar, Abel Presto, MD  VITAMIN D PO Take by mouth daily.   Yes [provider]  predniSONE (DELTASONE) 5 MG tablet Take 2 tablets (10mg ) by mouth for 1 wk, then 1 & 1/2 tabs (7.5mg ) for 1 wk, then 1 tab (5mg ) for 1 wk, then 1/2 tab (2.5mg ) for 1 wk. Patient not taking: Reported on 02/23/2018 01/28/18   Bo Merino, MD  Topiramate ER (TROKENDI XR) 50 MG CP24 Take 3 capsules by mouth at bedtime. Patient not taking: Reported on 09/28/2017 10/12/16   Kathrynn Ducking, MD    ROS:  Out of a complete 14 system review of symptoms, the patient complains only of the following symptoms, and all other reviewed systems are negative.  Joint pain Walking problem  Blood pressure 116/72, pulse 66, height 5\' 2"  (1.575 m), weight 229 lb (103.9 kg).  Physical Exam  General: The patient is alert and cooperative at the time of the examination.  The patient is significantly obese.  Skin: No significant peripheral edema is noted.   Neurologic Exam  Mental status: The patient is alert and oriented x 3 at the time of the examination. The patient has apparent normal recent and remote memory, with an apparently normal attention span and concentration ability.   Cranial nerves: Facial  symmetry is present. Speech is normal, no aphasia or dysarthria is noted. Extraocular movements are full. Visual fields are full.  Motor: The patient has good strength in all 4 extremities.  Sensory examination: Soft touch sensation is symmetric on the face, arms, and legs.  Coordination: The patient has good finger-nose-finger and heel-to-shin bilaterally.  Gait and station: The patient has a limping type gait on both legs, she uses a cane for ambulation. Romberg is negative. No drift is seen.  Reflexes: Deep tendon reflexes are symmetric.   Assessment/Plan:  1.  History of seizures  2.  Psoriatic arthritis  The patient will be placed back on Trokendi working up to 150 mg.  She will increase her dose by 50 mg every 2 weeks.  The patient will drop her Dilantin dose to 300 mg at night, once she has gotten to 150 mg of Trokendi, she will call and we will start a taper off of the Dilantin completely.  She will follow-up in 6 months.  Jill Alexanders MD 02/23/2018 3:13 PM  [facility] [address] [place], [place] [address]  Phone [phone] Fax [phone]

## 2018-02-23 NOTE — Patient Instructions (Signed)
Start Dilantin 300 mg at night.  We will start the Trokendi 50 mg at night and go to 100 mg after 2 weeks, then take 150 mg at night after another 2 weeks. Call our office at that time and we will initiate a full taper off of the Dilantin.

## 2018-03-01 ENCOUNTER — Other Ambulatory Visit: Payer: Self-pay | Admitting: Rheumatology

## 2018-03-01 NOTE — Telephone Encounter (Signed)
Patient no longer on tablets now on Rasuvo

## 2018-03-17 ENCOUNTER — Telehealth: Payer: Self-pay | Admitting: Pharmacy Technician

## 2018-03-17 ENCOUNTER — Telehealth: Payer: Self-pay | Admitting: Neurology

## 2018-03-17 NOTE — Telephone Encounter (Signed)
Pt is calling stating that her ins has changed to [place] ins is needing a Prior auth for   Topiramate ER (TROKENDI XR) 50 MG CP24    Fax num [phone] Please advised.

## 2018-03-17 NOTE — Telephone Encounter (Signed)
Received call form patient, she has new insurance for 2020. Will need new Prior Authorizations for Voltaren gel, clobetasol, Simponi, and Rasuvo. She has medication on had at home for January for all meds except Voltaren gel.   New planLorella Nimrod  [place]- [id number] ID- [id number] Group- [phone] [id number]  No pcn    Advised patient we will work on new prior authorizations for her.  3:07 PM Beatriz Chancellor, CPhT

## 2018-03-17 NOTE — Telephone Encounter (Signed)
Received a Prior Authorization request from PATIENT for Rasuvo and Simponi. Authorization has been submitted to patient's insurance via Cover My Meds. Will update once we receive a response.

## 2018-03-18 NOTE — Telephone Encounter (Signed)
Prior Authorization submitted via cover my meds for Voltaren Gel and Clobetasol Cream. Will update once response received.

## 2018-03-18 NOTE — Telephone Encounter (Signed)
PA completed for Trokendi XR 50 mg via cover my meds.  KEY # [id number]   Dx code: H70.263  Pt has tried/failed: Topamax 50 mg.  Will follow status on cover my meds   Pt's new insurance for [place]: ID #: [id number] Group #: [id number] Rx Bin: [id number]   Pharmacy help desk # [phone]  MB RN

## 2018-03-18 NOTE — Telephone Encounter (Deleted)
Waiting on approval MB RN

## 2018-03-21 ENCOUNTER — Telehealth: Payer: Self-pay | Admitting: Pharmacy Technician

## 2018-03-21 NOTE — Telephone Encounter (Signed)
Received a fax regarding Prior Authorization from [facility] for Rasuvo. Authorization has been DENIED because plan prefers trial and failure of Otrexup. Patient previous plan preferred Rasuvo.  Will send document to scan center.  Phone# [phone]   Submitted PA for Otrexup via Covermymeds. Awaiting response.  8:46 AM Beatriz Chancellor, CPhT

## 2018-03-21 NOTE — Telephone Encounter (Signed)
Received a fax from [facility] regarding a prior authorization for [facility]. Authorization has been APPROVED from 03/17/2018 to 03/15/2038.   Will send document to scan center.  Authorization # [id number] Phone # [phone]

## 2018-03-21 NOTE — Telephone Encounter (Signed)
Received approval via [place] fax for Trokendi 50 mg XR.  Effective dates: 03/18/18-03/16/21.  [facility] mail service notified of approval. MB RN

## 2018-03-22 NOTE — Telephone Encounter (Signed)
Received a fax from [facility] regarding a prior authorization for [place]. Authorization has been APPROVED from 03/21/2018 to 03/19/2021.   Will send document to scan center.  Authorization # [id number]   Left patient a message to call back to discuss that her next prescription will be Otrexup since it is preferred by her new plan.  11:14 AM Beatriz Chancellor, CPhT

## 2018-03-23 ENCOUNTER — Other Ambulatory Visit: Payer: Self-pay | Admitting: Pharmacist

## 2018-03-23 DIAGNOSIS — L4050 Arthropathic psoriasis, unspecified: Secondary | ICD-10-CM

## 2018-03-23 MED ORDER — METHOTREXATE (PF) 20 MG/0.4ML ~~LOC~~ SOAJ: 20.0000 mg | SUBCUTANEOUS | 0 refills | Status: DC

## 2018-03-24 NOTE — Telephone Encounter (Signed)
Patient called in to inquire why Clobetasol was denied and if the office can appeal. Requesting a call back.

## 2018-03-28 NOTE — Telephone Encounter (Signed)
Attempted to contact the patient and left message for patient to call the office. Clobetasol was denied by insurance company because the request does not meet the definition of medical necessity.  Patient has to try alternative medications which include Augmented betamethasone 0.05% ointment., Clobetasol 0.05% emollient/onitment/ shampoo/solution or Halobetasol 0.05% cream/onitment.

## 2018-03-28 NOTE — Telephone Encounter (Signed)
Voltaren Gel approved. 03/18/2018- 03/16/2021

## 2018-04-11 ENCOUNTER — Other Ambulatory Visit: Payer: Self-pay

## 2018-04-11 ENCOUNTER — Telehealth: Payer: Self-pay | Admitting: Pharmacist

## 2018-04-11 DIAGNOSIS — L4050 Arthropathic psoriasis, unspecified: Secondary | ICD-10-CM

## 2018-04-11 DIAGNOSIS — Z79899 Other long term (current) drug therapy: Secondary | ICD-10-CM

## 2018-04-11 MED ORDER — GOLIMUMAB 50 MG/0.5ML ~~LOC~~ SOAJ: SUBCUTANEOUS | 0 refills | Status: DC

## 2018-04-11 MED ORDER — METHOTREXATE (PF) 20 MG/0.4ML ~~LOC~~ SOAJ: 20.0000 mg | SUBCUTANEOUS | 0 refills | Status: DC

## 2018-04-11 NOTE — Addendum Note (Signed)
Addended by: Mariella Saa C on: 04/11/2018 04:28 PM   Modules accepted: Orders

## 2018-04-11 NOTE — Telephone Encounter (Signed)
Patient called inquiring about Simponi and Otrexup prescriptions.  Her insurance now requires her to use [facility].  We have not sent prescriptions due to pending labs.  She states that she had them done in December at [facility] but they do not show up in [place]. Informed patient that we can no send in prescriptions until we receive results or she comes in for labs. Patient verbalized understanding and is going to have the results sent to our office.  She is due for her Otrexup dose Friday and Simponi early next week. Offered samples if she is unable to get shipment in time. Patient verbalized understanding.  All questions encouraged and answered.  Instructed patient to call with any further questions or concerns.  Mariella Saa, PharmD, [facility] Rheumatology Clinical Pharmacist  04/11/2018 2:54 PM

## 2018-04-11 NOTE — Telephone Encounter (Signed)
Patient came for labs.  Prescription sent to [facility].  Medication Samples have been provided to the patient.  Drug name: Simponi      Strength: 50mg         Qty: 1   LOT: [id number]   Exp.Date: 07/15/2019  Dosing instructions: Inject 1 pen every 28 days.  Drug name: Rasuvo Strength: 20mg         Qty: 1   LOT: [id number]   Exp.Date: 12/15/2018  Dosing instructions: Inject 1 pen every 28 days.  The patient has been instructed regarding the correct time, dose, and frequency of taking this medication, including desired effects and most common side effects.   All questions encouraged and answered.  Instructed patient to call with any further questions or concerns.  Mariella Saa, PharmD, [facility] Rheumatology Clinical Pharmacist  04/11/2018 4:13 PM

## 2018-04-12 LAB — COMPLETE METABOLIC PANEL WITH GFR
AG RATIO: 1.4 (calc) (ref 1.0–2.5)
ALBUMIN MSPROF: 4.1 g/dL (ref 3.6–5.1)
ALT: 18 U/L (ref 6–29)
AST: 16 U/L (ref 10–35)
Alkaline phosphatase (APISO): 55 U/L (ref 33–130)
BUN: 19 mg/dL (ref 7–25)
CO2: 26 mmol/L (ref 20–32)
Calcium: 9.3 mg/dL (ref 8.6–10.4)
Chloride: 106 mmol/L (ref 98–110)
Creat: 0.66 mg/dL (ref 0.50–1.05)
GFR, EST NON AFRICAN AMERICAN: 102 mL/min/{1.73_m2} (ref >=60)
GFR, Est African American: 118 mL/min/{1.73_m2} (ref >=60)
GLOBULIN: 3 g/dL (ref 1.9–3.7)
Glucose, Bld: 84 mg/dL (ref 65–99)
POTASSIUM: 4 mmol/L (ref 3.5–5.3)
SODIUM: 141 mmol/L (ref 135–146)
Total Bilirubin: 0.4 mg/dL (ref 0.2–1.2)
Total Protein: 7.1 g/dL (ref 6.1–8.1)

## 2018-04-12 LAB — CBC WITH DIFFERENTIAL/PLATELET
ABSOLUTE MONOCYTES: 559 {cells}/uL (ref 200–950)
BASOS ABS: 41 {cells}/uL (ref 0–200)
Basophils Relative: 0.5 %
EOS PCT: 0.4 %
Eosinophils Absolute: 32 cells/uL (ref 15–500)
HEMATOCRIT: 38.3 % (ref 35.0–45.0)
HEMOGLOBIN: 12.9 g/dL (ref 11.7–15.5)
LYMPHS ABS: 2786 {cells}/uL (ref 850–3900)
MCH: 31.2 pg (ref 27.0–33.0)
MCHC: 33.7 g/dL (ref 32.0–36.0)
MCV: 92.7 fL (ref 80.0–100.0)
MPV: 9.6 fL (ref 7.5–12.5)
Monocytes Relative: 6.9 %
NEUTROS ABS: 4682 {cells}/uL (ref 1500–7800)
NEUTROS PCT: 57.8 %
Platelets: 378 10*3/uL (ref 140–400)
RBC: 4.13 10*6/uL (ref 3.80–5.10)
RDW: 14.1 % (ref 11.0–15.0)
Total Lymphocyte: 34.4 %
WBC: 8.1 10*3/uL (ref 3.8–10.8)

## 2018-04-12 NOTE — Progress Notes (Signed)
Labs are WNL.

## 2018-04-22 ENCOUNTER — Telehealth: Payer: Self-pay | Admitting: Rheumatology

## 2018-04-22 ENCOUNTER — Other Ambulatory Visit: Payer: Self-pay | Admitting: Pharmacist

## 2018-04-22 DIAGNOSIS — L4050 Arthropathic psoriasis, unspecified: Secondary | ICD-10-CM

## 2018-04-22 MED ORDER — METHOTREXATE (PF) 20 MG/0.4ML ~~LOC~~ SOAJ: 20.0000 mg | SUBCUTANEOUS | 0 refills | Status: DC

## 2018-04-22 NOTE — Telephone Encounter (Signed)
Patient advised prescription has been sent to the pharmacy. Patient states she took 8 tabs of MTX since she did not have a MTX injection. Patient advised that was okay but not to inject an MTX pen until her next dose is due. Patient verbalized understanding.

## 2018-04-22 NOTE — Telephone Encounter (Signed)
Patient states pharmacy did not get MTX rx, and patient is completely out. Patient took 8 pills of her old MTX today so she would have it in her system. Please call [place] with rx for MTX injections.

## 2018-04-28 ENCOUNTER — Telehealth: Payer: Self-pay | Admitting: Pharmacist

## 2018-04-28 NOTE — Telephone Encounter (Signed)
Medication Samples have been provided to the patient.  Drug name: Otrexup  Strength: 20 mg       Qty: 1   LOT: [id number]   Exp.Date: 08-15-2019  Dosing instructions: Inject 1 pen weekly  The patient has been instructed regarding the correct time, dose, and frequency of taking this medication, including desired effects and most common side effects.   Patient has been having difficulty obtaining prescription from [facility]. Called pharmacy and gave verbal order for Otrexup 20 mg for 90 day supply.  Notified patient.

## 2018-05-05 NOTE — Progress Notes (Signed)
Office Visit Note  Patient: Tiffany Wilkins             Date of Birth: [birth year]           MRN: [id number]             PCP: Rita Ohara, MD Referring: Rita Ohara, MD Visit Date: 05/17/2018 Occupation: _0 @  Subjective:  Pain in hands.    History of Present Illness: Tiffany Wilkins is a 53 y.o. female with a history of psoriatic arthritis and psoriasis. Patient reports she is doing much better since last visit. She has mild joint discomfort in hands and knees. She reports bilateral hand swelling and states her ankle swelling has greatly improved. She feels she has decreased strength in bilateral hands. She does have morning stiffness of her bilateral hands, knees and feet, which improves throughout the day. Denies SI joint discomfort. She reports right foot plantar fasciitis. Denies Achilles tendonitis. She does currently have a psoriasis rash behind her right ear, umbilicus and lower back. She has returned back to work and things are going well. 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      Activities of Daily Living:  Patient reports morning stiffness for 1.5 hours.   Patient Denies nocturnal pain.  Difficulty dressing/grooming: Denies Difficulty climbing stairs: Denies Difficulty getting out of chair: Denies Difficulty using hands for taps, buttons, cutlery, and/or writing: Reports  Review of Systems  Constitutional: Positive for fatigue.  HENT: Negative for mouth sores, mouth dryness and nose dryness.   Eyes: Negative for dryness.  Cardiovascular: Negative for chest pain and palpitations.  Gastrointestinal: Positive for constipation. Negative for abdominal pain, blood in stool and diarrhea.  Musculoskeletal: Positive for arthralgias, joint pain, joint swelling, muscle weakness and morning stiffness. Negative for myalgias, muscle tenderness and myalgias.  Skin: Positive for rash.  Allergic/Immunologic: Negative for susceptible to infections.  Neurological:  Negative for dizziness and fainting.  Psychiatric/Behavioral: Positive for sleep disturbance. Negative for depressed mood. The patient is nervous/anxious.     PMFS History:  Patient Active Problem List   Diagnosis Date Noted  . Primary osteoarthritis of both knees 12/11/2016  . High risk medication use 06/30/2016  . Right hand pain 06/30/2016  . Right hip pain 06/30/2016  . Hepatic steatosis 06/27/2014  . BMI 50.0-59.9, adult ([place]) 06/27/2014  . Fibroid, uterine 06/27/2014  . Abdominal pain, chronic, right lower quadrant 06/27/2014  . Obesity hypoventilation syndrome ([place]) 12/08/2013  . Nocturnal seizures ([place]) 12/08/2013  . OSA (obstructive sleep apnea) 12/08/2013  . Noncompliance with CPAP treatment 12/08/2013  . Psoriasis 11/29/2013  . Seizure disorder, complex partial ([place]) 06/01/2012  . Migraine 06/01/2012  . OSA on CPAP 01/04/2012  . Hiatal hernia 03/26/2011  . Internal hemorrhoid 03/24/2011  . Reflux esophagitis 03/24/2011  . Psoriatic arthritis ([place]) 01/22/2011  . Hernia of abdominal wall 01/22/2011    Past Medical History:  Diagnosis Date  . Allergic rhinitis, cause unspecified   . Anal fissure   . Arthritis    inflammatory and psoriatic--Dr. Estanislado Pandy  . Asthma    related to allergies and colds  . Chronic bronchitis    gets bronchitis yearly with colds  . Chronic kidney disease kidney stones  . Fracture    stress fracture right foot  . GERD (gastroesophageal reflux disease)   . Hernia (acquired) (recurrent)    R lower abdomen; has seen [facility] (Dr. Zena Amos and recurred  . Hiatal hernia 03/2011  . Impaired fasting glucose   .  Internal hemorrhoids 03/2011  . Menstrual migraine    Dr, Jannifer Franklin  . Obesity   . Reflux esophagitis 03/2011  . Seizure disorder ([place])    f/b Dr. Jannifer Franklin  . Seizures ([place]) last seisure june 2012  . Sleep apnea    inconclusive test per pt (some central and obstructive component)  . 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 Tinea cruris 7/09    Family History  Problem Relation Age of Onset  . Arthritis Mother        rheumatoid  . Hyperlipidemia Mother   . Hypertension Mother   . Hyperthyroidism Mother        s/p thyroidectomy, now with hypothyroidism  . Cancer Father 41       AML, lung cancer, kidney cancer (all presented at same time)  . Diabetes Father   . Hypertension Father   . Hyperlipidemia Father   . Psoriasis Father   . Anxiety disorder Father        OCD, nervous breakdown  . Psoriasis Sister   . Irritable bowel syndrome Sister   . Arthritis Sister        psoriatic  . Diabetes Sister   . GER disease Daughter   . Asthma Son   . Asthma Maternal Grandmother   . Hyperlipidemia Maternal Grandmother   . Heart disease Maternal Grandmother   . Stroke Maternal Grandmother   . Diabetes Maternal Grandfather   . Heart disease Maternal Grandfather   . Cancer Paternal Grandmother 9       female (?ovarian)  . Diabetes Paternal Grandmother   . Hypertension Paternal Grandmother   . Diabetes Paternal Grandfather   . Heart disease Paternal Grandfather   . Hypertension Paternal Grandfather   . Diabetes Paternal Uncle   . Crohn's disease Paternal Uncle   . Cancer Paternal Uncle        ? type  . Colitis Paternal Uncle   . Stroke Maternal Aunt   . Stroke Cousin   . Esophageal cancer Neg Hx   . Stomach cancer Neg Hx    Past Surgical History:  Procedure Laterality Date  . BARIATRIC SURGERY  09/22/2017  . CESAREAN SECTION     x2  . CHOLECYSTECTOMY  2005  . COLONOSCOPY  03/19/11   Dr. Olevia Perches; internal hemorrhoids  . ESOPHAGOGASTRODUODENOSCOPY  03/19/11   hiatal hernia, esophagitis  . KIDNEY STONE SURGERY  08/2010   retrieved with basket (at [facility])  . repair of incisional hernia  2005, 2008   related to laparoscopic cholecystectomy   Social History   Social History Narrative   Patient Lives with husband [name])  and 2 children  (son and daughter, 22, 85)   Patient is right handed.   Patient has high school education.   Patient drinks 2 cups daily- coffee/soda            Immunization History  Administered Date(s) Administered  . Influenza Split 12/29/2010  . Influenza,inj,Quad PF,6+ Mos 11/29/2013  . Tdap 11/29/2013     Objective: Vital Signs: BP 110/83 (BP Location: Left Arm, Patient Position: Sitting, Cuff Size: Normal)   Pulse 69   Resp 14   Ht _0  (1.575 m)   Wt 213 lb (96.6 kg)   BMI 38.96 kg/m    Physical Exam Vitals signs and nursing note reviewed.  Constitutional:      Appearance: She is well-developed.  HENT:  Head: Normocephalic and atraumatic.  Eyes:     Conjunctiva/sclera: Conjunctivae normal.  Neck:     Musculoskeletal: Normal range of motion.  Cardiovascular:     Rate and Rhythm: Normal rate and regular rhythm.     Heart sounds: Normal heart sounds.  Pulmonary:     Effort: Pulmonary effort is normal.     Breath sounds: Normal breath sounds.  Abdominal:     General: Bowel sounds are normal.     Palpations: Abdomen is soft.  Lymphadenopathy:     Cervical: No cervical adenopathy.  Skin:    General: Skin is warm and dry.     Capillary Refill: Capillary refill takes less than 2 seconds.  Neurological:     Mental Status: She is alert and oriented to person, place, and time.  Psychiatric:        Behavior: Behavior normal.      Musculoskeletal Exam: C-spine, thoracic and lumbar spine good range of motion.  Shoulder joints elbow joints wrist joints with good range of motion.  She has some swelling over her right first MCP joint.  Tenderness over left second third and fourth MCP joint but no synovitis was noted.  Hip joints knee joints ankles MTPs PIPs with good range of motion with no synovitis.  She has some tenderness across MTPs.  CDAI Exam: CDAI Score: 5.6  Patient Global Assessment: 2 (mm); Provider Global Assessment: 4 (mm) Swollen: 1 ; Tender: 4  Joint Exam       Right  Left  MCP 1  Swollen Tender     MCP 2      Tender  MCP 3      Tender  MCP 4      Tender     Investigation: No additional findings.  Imaging: No results found.  Recent Labs: Lab Results  Component Value Date   WBC 8.1 04/11/2018   HGB 12.9 04/11/2018   PLT 378 04/11/2018   NA 141 04/11/2018   K 4.0 04/11/2018   CL 106 04/11/2018   CO2 26 04/11/2018   GLUCOSE 84 04/11/2018   BUN 19 04/11/2018   CREATININE 0.66 04/11/2018   BILITOT 0.4 04/11/2018   ALKPHOS 75 10/16/2016   AST 16 04/11/2018   ALT 18 04/11/2018   PROT 7.1 04/11/2018   ALBUMIN 4.2 10/16/2016   CALCIUM 9.3 04/11/2018   GFRAA 118 04/11/2018   QFTBGOLDPLUS NEGATIVE 02/03/2018    Speciality Comments: TB Gold: 10/16/16 Neg Failed Cosentyx-developed rash that sent her to the ER Does not want to try TNF-inhibitor due to neurologic risk since she has epilepsy  Procedures:  No procedures performed Allergies: Cosentyx [secukinumab]; Hydrocodone; Vimpat [lacosamide]; Amoxicillin; Codeine; Keppra [levetiracetam]; Ketorolac tromethamine; Sulfa antibiotics; and Transderm-scop [scopolamine]   Assessment / Plan:     Visit Diagnoses: Psoriatic arthritis (HCC)-patient is clinically doing much better on combination of Rasuvo and Simponi.  She has mild synovitis in her right thumb.  She is overall very pleased with the response.  I would like to see better control of her arthritis.  We will give combination little more time.    Psoriasis-she had no active psoriasis lesions today.  High risk medication use - Simponi subcu monthly, Rasuvo 20 mg subcu weekly, folic acid, prednisone 5 mg p.o. daily. (Cosentyx in the past and had reaction.)  - Plan: CBC with Differential/Platelet, COMPLETE METABOLIC PANEL WITH GFR every 3 months.  HLA B27 positive  History of asthma  History of obesity-weight loss diet and exercise was  discussed.  Her BMI is 38.96.  History of abdominal hernia  History of cholecystectomy  History  of hernia repair - x2  History of epilepsy - Dr. Jannifer Franklin    History of bronchitis  History of sleep apnea  Family history of rheumatoid arthritis - mother.   Orders: Orders Placed This Encounter  Procedures  . CBC with Differential/Platelet  . COMPLETE METABOLIC PANEL WITH GFR   No orders of the defined types were placed in this encounter.   Face-to-face time spent with patient was 30 minutes. Greater than 50% of time was spent in counseling and coordination of care.  Follow-Up Instructions: Return in about 5 months (around 10/17/2018) for Psoriatic arthritis.   Bo Merino, MD  Note - This record has been created using Editor, commissioning.  Chart creation errors have been sought, but may not always  have been located. Such creation errors do not reflect on  the standard of medical care.

## 2018-05-17 ENCOUNTER — Ambulatory Visit (INDEPENDENT_AMBULATORY_CARE_PROVIDER_SITE_OTHER): Payer: BLUE CROSS/BLUE SHIELD | Admitting: Rheumatology

## 2018-05-17 ENCOUNTER — Encounter: Payer: Self-pay | Admitting: Rheumatology

## 2018-05-17 VITALS — BP 110/83 | HR 69 | Resp 14 | Ht 62.0 in | Wt 213.0 lb

## 2018-05-17 DIAGNOSIS — L4050 Arthropathic psoriasis, unspecified: Secondary | ICD-10-CM

## 2018-05-17 DIAGNOSIS — L409 Psoriasis, unspecified: Secondary | ICD-10-CM

## 2018-05-17 DIAGNOSIS — Z1589 Genetic susceptibility to other disease: Secondary | ICD-10-CM

## 2018-05-17 DIAGNOSIS — Z79899 Other long term (current) drug therapy: Secondary | ICD-10-CM | POA: Diagnosis not present

## 2018-05-17 DIAGNOSIS — Z8639 Personal history of other endocrine, nutritional and metabolic disease: Secondary | ICD-10-CM

## 2018-05-17 DIAGNOSIS — Z8709 Personal history of other diseases of the respiratory system: Secondary | ICD-10-CM

## 2018-05-17 DIAGNOSIS — Z8719 Personal history of other diseases of the digestive system: Secondary | ICD-10-CM

## 2018-05-17 DIAGNOSIS — Z9889 Other specified postprocedural states: Secondary | ICD-10-CM

## 2018-05-17 DIAGNOSIS — Z8261 Family history of arthritis: Secondary | ICD-10-CM

## 2018-05-17 DIAGNOSIS — Z9049 Acquired absence of other specified parts of digestive tract: Secondary | ICD-10-CM

## 2018-05-17 DIAGNOSIS — Z8669 Personal history of other diseases of the nervous system and sense organs: Secondary | ICD-10-CM

## 2018-05-17 NOTE — Patient Instructions (Signed)
Standing Labs We placed an order today for your standing lab work.    Please come back and get your standing labs in April Every 3 months  We have open lab Monday through Friday from 8:30-11:30 AM and 1:30-4:00 PM  at the office of Dr. Bo Merino.   You may experience shorter wait times on Monday and Friday afternoons. The office is located at [address] [place], [place], [place] [address] No appointment is necessary.   Labs are drawn by [facility].  You may receive a bill from [facility] for your lab work.  If you wish to have your labs drawn at another location, please call the office 24 hours in advance to send orders.  If you have any questions regarding directions or hours of operation,  please call [phone].   Just as a reminder please drink plenty of water prior to coming for your lab work. Thanks!

## 2018-05-26 ENCOUNTER — Telehealth: Payer: Self-pay | Admitting: Neurology

## 2018-05-26 NOTE — Telephone Encounter (Signed)
Patient notified via vm PA has been completed/approved. Pt advised to call back if she had any issues picking the medication up.

## 2018-05-26 NOTE — Telephone Encounter (Signed)
Patient called in and stated that she needs a PA for Trokendi 50 mg XR due to insurance change. She states you can call the PA line for [employer] at [phone]. She states it needs an urgency put to it so that she can get her meds in time.

## 2018-05-26 NOTE — Telephone Encounter (Signed)
PA completed for Trokendi via cover my meds. Received instant pa approval [id number] Approvedtoday Effective from 05/26/2018 through 05/24/2021.

## 2018-07-08 ENCOUNTER — Other Ambulatory Visit: Payer: Self-pay | Admitting: *Deleted

## 2018-07-08 DIAGNOSIS — L4050 Arthropathic psoriasis, unspecified: Secondary | ICD-10-CM

## 2018-07-08 MED ORDER — GOLIMUMAB 50 MG/0.5ML ~~LOC~~ SOAJ: SUBCUTANEOUS | 0 refills | Status: DC

## 2018-07-08 NOTE — Telephone Encounter (Signed)
Last Visit: 05/17/18 Next Visit: 10/18/18 Labs: 04/11/18 WNL TB Gold: 02/03/18 Neg   Okay to refill per Dr. Estanislado Pandy

## 2018-08-18 ENCOUNTER — Telehealth: Payer: Self-pay | Admitting: *Deleted

## 2018-08-18 NOTE — Telephone Encounter (Signed)
Called pt to R/S appt from 08-25-18 to another day also convert to doxy.me. Unable to get in contact with the patient to convert their office visit with Judson Roch on 08/2018 into a doxy.me visit. I left a voicemail asking the patient to return my call. Office number was provided.     If patient calls back please convert their office visit into a doxy.me visit.

## 2018-08-22 NOTE — Telephone Encounter (Signed)
LMVM for pt that appt cancelled and need to reschedule appt, from 08-25-18 to another date.  LMVM with [phone].

## 2018-08-25 ENCOUNTER — Ambulatory Visit: Payer: 59 | Admitting: Neurology

## 2018-09-15 ENCOUNTER — Other Ambulatory Visit: Payer: Self-pay

## 2018-09-15 DIAGNOSIS — Z79899 Other long term (current) drug therapy: Secondary | ICD-10-CM

## 2018-09-16 LAB — CBC WITH DIFFERENTIAL/PLATELET
Absolute Monocytes: 402 cells/uL (ref 200–950)
Basophils Absolute: 60 cells/uL (ref 0–200)
Basophils Relative: 0.9 %
Eosinophils Absolute: 27 cells/uL (ref 15–500)
Eosinophils Relative: 0.4 %
HCT: 38.2 % (ref 35.0–45.0)
Hemoglobin: 12.8 g/dL (ref 11.7–15.5)
Lymphs Abs: 3310 cells/uL (ref 850–3900)
MCH: 30.4 pg (ref 27.0–33.0)
MCHC: 33.5 g/dL (ref 32.0–36.0)
MCV: 90.7 fL (ref 80.0–100.0)
MPV: 9 fL (ref 7.5–12.5)
Monocytes Relative: 6 %
Neutro Abs: 2901 cells/uL (ref 1500–7800)
Neutrophils Relative %: 43.3 %
Platelets: 411 10*3/uL — ABNORMAL HIGH (ref 140–400)
RBC: 4.21 10*6/uL (ref 3.80–5.10)
RDW: 13.1 % (ref 11.0–15.0)
Total Lymphocyte: 49.4 %
WBC: 6.7 10*3/uL (ref 3.8–10.8)

## 2018-09-16 LAB — COMPLETE METABOLIC PANEL WITH GFR
AG Ratio: 1.4 (calc) (ref 1.0–2.5)
ALT: 38 U/L — ABNORMAL HIGH (ref 6–29)
AST: 40 U/L — ABNORMAL HIGH (ref 10–35)
Albumin: 4.1 g/dL (ref 3.6–5.1)
Alkaline phosphatase (APISO): 76 U/L (ref 37–153)
BUN: 20 mg/dL (ref 7–25)
CO2: 29 mmol/L (ref 20–32)
Calcium: 9.3 mg/dL (ref 8.6–10.4)
Chloride: 106 mmol/L (ref 98–110)
Creat: 0.6 mg/dL (ref 0.50–1.05)
GFR, Est African American: 121 mL/min/{1.73_m2} (ref >=60)
GFR, Est Non African American: 105 mL/min/{1.73_m2} (ref >=60)
Globulin: 2.9 g/dL (calc) (ref 1.9–3.7)
Glucose, Bld: 89 mg/dL (ref 65–99)
Potassium: 4.9 mmol/L (ref 3.5–5.3)
Sodium: 143 mmol/L (ref 135–146)
Total Bilirubin: 0.3 mg/dL (ref 0.2–1.2)
Total Protein: 7 g/dL (ref 6.1–8.1)

## 2018-09-19 ENCOUNTER — Other Ambulatory Visit: Payer: Self-pay | Admitting: *Deleted

## 2018-09-19 DIAGNOSIS — L4050 Arthropathic psoriasis, unspecified: Secondary | ICD-10-CM

## 2018-09-19 MED ORDER — OTREXUP 20 MG/0.4ML ~~LOC~~ SOAJ: 20.0000 mg | SUBCUTANEOUS | 0 refills | Status: DC

## 2018-09-19 NOTE — Progress Notes (Signed)
LFTs are mildly elevated. Please advise pt to avoid all NSAIDS, Tylenol and alcohol use.

## 2018-09-19 NOTE — Telephone Encounter (Signed)
Refill request received via fax  Last Visit: 05/17/18 Next Visit: 10/18/18 Labs: 09/15/18  LFTs are mildly elevated.  Okay to refill per Dr. Estanislado Pandy

## 2018-09-20 NOTE — Progress Notes (Signed)
Tylenol is the only option which she can take for pain.  Although it will elevate her liver functions.

## 2018-09-28 ENCOUNTER — Other Ambulatory Visit: Payer: Self-pay | Admitting: Rheumatology

## 2018-09-28 NOTE — Telephone Encounter (Signed)
Last Visit:05/17/18 Next Visit:10/18/18  Okay to refill per Dr. Estanislado Pandy

## 2018-09-29 ENCOUNTER — Other Ambulatory Visit: Payer: Self-pay | Admitting: *Deleted

## 2018-09-29 DIAGNOSIS — L4050 Arthropathic psoriasis, unspecified: Secondary | ICD-10-CM

## 2018-09-29 MED ORDER — SIMPONI 50 MG/0.5ML ~~LOC~~ SOAJ: SUBCUTANEOUS | 0 refills | Status: DC

## 2018-09-29 NOTE — Telephone Encounter (Signed)
Last Visit:05/17/18 Next Visit:10/18/18 Labs:09/15/18  LFTs are mildly elevated. TB Gold: 02/03/18 Neg   Okay to refill per Dr. Estanislado Pandy

## 2018-10-04 NOTE — Progress Notes (Signed)
Office Visit Note  Patient: Tiffany Wilkins             Date of Birth: [birth year]           MRN: [id number]             PCP: Rita Ohara, MD Referring: Rita Ohara, MD Visit Date: 10/18/2018 Occupation: @GUAROCC @  Subjective:  Pain and stiffness in joints.   History of Present Illness: Tiffany Wilkins is a 53 y.o. female with history of psoriatic arthritis and psoriasis.  She states she continues to have some joint pain and stiffness from psoriatic arthritis.  Her symptoms are usually better after the Simponi injections and then get worse just prior to the injection.  She continues to have some stiffness in her right first MCP joint and left first DIP joint.  She also continues to have some discomfort in her right shoulder and bilateral ankles.  She has few psoriasis patches, one in her umbilical area and one on her back.  She requested a prescription refill for clobetasol.  Activities of Daily Living:  Patient reports morning stiffness for 1 hour.   Patient Reports nocturnal pain.  Difficulty dressing/grooming: Denies Difficulty climbing stairs: Denies Difficulty getting out of chair: Reports Difficulty using hands for taps, buttons, cutlery, and/or writing: Denies  Review of Systems  Constitutional: Positive for fatigue. Negative for night sweats, weight gain and weight loss.  HENT: Negative for mouth sores, trouble swallowing, trouble swallowing, mouth dryness and nose dryness.   Eyes: Negative for pain, redness, visual disturbance and dryness.  Respiratory: Negative for cough, shortness of breath and difficulty breathing.   Cardiovascular: Negative for chest pain, palpitations, hypertension, irregular heartbeat and swelling in legs/feet.  Gastrointestinal: Positive for constipation. Negative for blood in stool and diarrhea.  Endocrine: Negative for increased urination.  Genitourinary: Negative for vaginal dryness.  Musculoskeletal: Positive for arthralgias, joint pain, joint  swelling and morning stiffness. Negative for myalgias, muscle weakness, muscle tenderness and myalgias.  Skin: Positive for rash. Negative for color change, hair loss, skin tightness, ulcers and sensitivity to sunlight.  Allergic/Immunologic: Negative for susceptible to infections.  Neurological: Negative for dizziness, memory loss, night sweats and weakness.  Hematological: Negative for swollen glands.  Psychiatric/Behavioral: Negative for depressed mood and sleep disturbance. The patient is not nervous/anxious.     PMFS History:  Patient Active Problem List   Diagnosis Date Noted  . Primary osteoarthritis of both knees 12/11/2016  . High risk medication use 06/30/2016  . Right hand pain 06/30/2016  . Right hip pain 06/30/2016  . Hepatic steatosis 06/27/2014  . BMI 50.0-59.9, adult ([place]) 06/27/2014  . Fibroid, uterine 06/27/2014  . Abdominal pain, chronic, right lower quadrant 06/27/2014  . Obesity hypoventilation syndrome ([place]) 12/08/2013  . Nocturnal seizures ([place]) 12/08/2013  . OSA (obstructive sleep apnea) 12/08/2013  . Noncompliance with CPAP treatment 12/08/2013  . Psoriasis 11/29/2013  . Seizure disorder, complex partial ([place]) 06/01/2012  . Migraine 06/01/2012  . OSA on CPAP 01/04/2012  . Hiatal hernia 03/26/2011  . Internal hemorrhoid 03/24/2011  . Reflux esophagitis 03/24/2011  . Psoriatic arthritis ([place]) 01/22/2011  . Hernia of abdominal wall 01/22/2011    Past Medical History:  Diagnosis Date  . Allergic rhinitis, cause unspecified   . Anal fissure   . Arthritis    inflammatory and psoriatic--Dr. Estanislado Pandy  . Asthma    related to allergies and colds  . Chronic bronchitis    gets bronchitis yearly with colds  . Chronic  kidney disease kidney stones  . Fracture    stress fracture right foot  . GERD (gastroesophageal reflux disease)   . Hernia (acquired) (recurrent)    R lower abdomen; has seen [facility] (Dr. Zena Amos and recurred  . Hiatal hernia 03/2011  .  Impaired fasting glucose   . Internal hemorrhoids 03/2011  . Menstrual migraine    Dr, Jannifer Franklin  . Obesity   . Reflux esophagitis 03/2011  . Seizure disorder ([place])    f/b Dr. Jannifer Franklin  . Seizures ([place]) last seisure june 2012  . Sleep apnea    inconclusive test per pt (some central and obstructive component)  . Sleep apnea 06/07/09   mild complex sleep apnea, worse in supine position (uses CPAP intermittently only)  . Tinea cruris 7/09    Family History  Problem Relation Age of Onset  . Arthritis Mother        rheumatoid  . Hyperlipidemia Mother   . Hypertension Mother   . Hyperthyroidism Mother        s/p thyroidectomy, now with hypothyroidism  . Cancer Father 62       AML, lung cancer, kidney cancer (all presented at same time)  . Diabetes Father   . Hypertension Father   . Hyperlipidemia Father   . Psoriasis Father   . Anxiety disorder Father        OCD, nervous breakdown  . Psoriasis Sister   . Irritable bowel syndrome Sister   . Arthritis Sister        psoriatic  . Diabetes Sister   . GER disease Daughter   . Asthma Son   . Asthma Maternal Grandmother   . Hyperlipidemia Maternal Grandmother   . Heart disease Maternal Grandmother   . Stroke Maternal Grandmother   . Diabetes Maternal Grandfather   . Heart disease Maternal Grandfather   . Cancer Paternal Grandmother 5       female (?ovarian)  . Diabetes Paternal Grandmother   . Hypertension Paternal Grandmother   . Diabetes Paternal Grandfather   . Heart disease Paternal Grandfather   . Hypertension Paternal Grandfather   . Diabetes Paternal Uncle   . Crohn's disease Paternal Uncle   . Cancer Paternal Uncle        ? type  . Colitis Paternal Uncle   . Stroke Maternal Aunt   . Stroke Cousin   . Esophageal cancer Neg Hx   . Stomach cancer Neg Hx    Past Surgical History:  Procedure Laterality Date  . BARIATRIC SURGERY  09/22/2017  . CESAREAN SECTION     x2  . CHOLECYSTECTOMY  2005  . COLONOSCOPY  03/19/11    Dr. Olevia Perches; internal hemorrhoids  . ESOPHAGOGASTRODUODENOSCOPY  03/19/11   hiatal hernia, esophagitis  . KIDNEY STONE SURGERY  08/2010   retrieved with basket (at [facility])  . repair of incisional hernia  2005, 2008   related to laparoscopic cholecystectomy   Social History   Social History Narrative   Patient Lives with husband [name])  and 2 children (son and daughter, 63, 37)   Patient is right handed.   Patient has high school education.   Patient drinks 2 cups daily- coffee/soda            Immunization History  Administered Date(s) Administered  . Influenza Split 12/29/2010  . Influenza,inj,Quad PF,6+ Mos 11/29/2013  . Tdap 11/29/2013     Objective: Vital Signs: BP 109/69 (BP Location: Left Arm, Patient Position: Sitting, Cuff Size: Small)  Pulse 77   Resp 12   Ht 5' 2"  (1.575 m)   Wt 206 lb 3.2 oz (93.5 kg)   LMP 06/12/2014   BMI 37.71 kg/m    Physical Exam Vitals signs and nursing note reviewed.  Constitutional:      Appearance: She is well-developed.  HENT:     Head: Normocephalic and atraumatic.  Eyes:     Conjunctiva/sclera: Conjunctivae normal.  Neck:     Musculoskeletal: Normal range of motion.  Cardiovascular:     Rate and Rhythm: Normal rate and regular rhythm.     Heart sounds: Normal heart sounds.  Pulmonary:     Effort: Pulmonary effort is normal.     Breath sounds: Normal breath sounds.  Abdominal:     General: Bowel sounds are normal.     Palpations: Abdomen is soft.  Lymphadenopathy:     Cervical: No cervical adenopathy.  Skin:    General: Skin is warm and dry.     Capillary Refill: Capillary refill takes less than 2 seconds.  Neurological:     Mental Status: She is alert and oriented to person, place, and time.  Psychiatric:        Behavior: Behavior normal.      Musculoskeletal Exam: C-spine was in good range of motion.  She has some discomfort range of motion of her right shoulder joint.  Elbow joints and wrist joints with good  range of motion.  She has some synovial thickening and tenderness in her MCPs and DIPs as described below.  She had good range of motion of her knee joints with some discomfort.  She has tenderness over her ankle joints without any warmth swelling or effusion.  No dactylitis was noted.  CDAI Exam: CDAI Score: 3.6  Patient Global: 3 mm; Provider Global: 3 mm Swollen: 2 ; Tender: 4  Joint Exam      Right  Left  Glenohumeral   Tender     MCP 1  Swollen      DIP 2     Swollen   Knee      Tender  Ankle   Tender   Tender     Investigation: No additional findings.  Imaging: No results found.  Recent Labs: Lab Results  Component Value Date   WBC 6.7 09/15/2018   HGB 12.8 09/15/2018   PLT 411 (H) 09/15/2018   NA 143 09/15/2018   K 4.9 09/15/2018   CL 106 09/15/2018   CO2 29 09/15/2018   GLUCOSE 89 09/15/2018   BUN 20 09/15/2018   CREATININE 0.60 09/15/2018   BILITOT 0.3 09/15/2018   ALKPHOS 75 10/16/2016   AST 40 (H) 09/15/2018   ALT 38 (H) 09/15/2018   PROT 7.0 09/15/2018   ALBUMIN 4.2 10/16/2016   CALCIUM 9.3 09/15/2018   GFRAA 121 09/15/2018   QFTBGOLDPLUS NEGATIVE 02/03/2018  October 17, 2018 CBC normal, CMP normal with normal LFTs.,  Serum ferritin was normal   Speciality Comments: TB Gold: 10/16/16 Neg Failed Cosentyx-developed rash that sent her to the ER Does not want to try TNF-inhibitor due to neurologic risk since she has epilepsy  Procedures:  No procedures performed Allergies: Cosentyx [secukinumab], Hydrocodone, Vimpat [lacosamide], Amoxicillin, Codeine, Keppra [levetiracetam], Ketorolac tromethamine, Sulfa antibiotics, and Transderm-scop [scopolamine]   Assessment / Plan:     Visit Diagnoses: Psoriatic arthritis ([place]) -she is satisfied with current treatment she is on Simponi subcu and Otrexup subcu.  She still has intermittent discomfort just prior to her next injection.  She has some synovial thickening on examination today.  She also has some joint  tenderness.  She does not want to change her therapy.  Psoriasis -patient reports few psoriasis patches and requests refill on clobetasol cream plan: clobetasol cream (TEMOVATE) 0.05 %,   High risk medication use - Simponi 50 mg every 28 days, Otrexup 20 mg every 7 days, and folic acid 1 mg 2 tablets daily.  Last TB gold negative 02/03/2018 and will monitor yearly.  Most recent CBC/CMP within normal limits except for elevated LFT's on 09/15/2018.  Patient got repeat labs done by her PCP in August which showed normal CBC and normal CMP with normal LFTs.  I have advised her to stay off NSAIDs. Standing orders are in place.  Recommend annual influenza, Pneumovax 23, Prevnar 13, and Shingrix as indicated for immunosuppressant therapy.   Recommend yearly skin exams due to increased risk of melanoma.. (Cosentyx in the past and had reaction.)  Precautions to be taken during this pandemic were discussed.  I have also advised her to stop the medications in case she gets any form of infection.  She may resume the medications once the infection is treated and she is infection free.  HLA B27 positive  History of sleep apnea   History of hernia repair - x2   History of cholecystectomy   History of abdominal hernia   History of asthma  History of bronchitis -   History of obesity -weight loss diet and exercise was discussed.  History of epilepsy - Dr. Jannifer Franklin   Family history of rheumatoid arthritis - mother.  Orders: No orders of the defined types were placed in this encounter.  Meds ordered this encounter  Medications  . clobetasol cream (TEMOVATE) 0.05 %    Sig: Apply 1 application topically 2 (two) times daily.    Dispense:  45 g    Refill:  0      Follow-Up Instructions: Return in about 5 months (around 03/20/2019) for Psoriatic arthritis.   Bo Merino, MD  Note - This record has been created using Editor, commissioning.  Chart creation errors have been sought, but may not always   have been located. Such creation errors do not reflect on  the standard of medical care.

## 2018-10-18 ENCOUNTER — Ambulatory Visit: Payer: BC Managed Care – PPO | Admitting: Rheumatology

## 2018-10-18 ENCOUNTER — Encounter: Payer: Self-pay | Admitting: Rheumatology

## 2018-10-18 ENCOUNTER — Other Ambulatory Visit: Payer: Self-pay

## 2018-10-18 VITALS — BP 109/69 | HR 77 | Resp 12 | Ht 62.0 in | Wt 206.2 lb

## 2018-10-18 DIAGNOSIS — Z8709 Personal history of other diseases of the respiratory system: Secondary | ICD-10-CM

## 2018-10-18 DIAGNOSIS — Z1589 Genetic susceptibility to other disease: Secondary | ICD-10-CM

## 2018-10-18 DIAGNOSIS — L409 Psoriasis, unspecified: Secondary | ICD-10-CM

## 2018-10-18 DIAGNOSIS — Z79899 Other long term (current) drug therapy: Secondary | ICD-10-CM

## 2018-10-18 DIAGNOSIS — L4050 Arthropathic psoriasis, unspecified: Secondary | ICD-10-CM | POA: Diagnosis not present

## 2018-10-18 DIAGNOSIS — Z8639 Personal history of other endocrine, nutritional and metabolic disease: Secondary | ICD-10-CM

## 2018-10-18 DIAGNOSIS — Z8261 Family history of arthritis: Secondary | ICD-10-CM

## 2018-10-18 DIAGNOSIS — Z8719 Personal history of other diseases of the digestive system: Secondary | ICD-10-CM

## 2018-10-18 DIAGNOSIS — Z9049 Acquired absence of other specified parts of digestive tract: Secondary | ICD-10-CM

## 2018-10-18 DIAGNOSIS — Z8669 Personal history of other diseases of the nervous system and sense organs: Secondary | ICD-10-CM

## 2018-10-18 DIAGNOSIS — Z9889 Other specified postprocedural states: Secondary | ICD-10-CM

## 2018-10-18 MED ORDER — CLOBETASOL PROPIONATE 0.05 % EX CREA: 1.0000 "application " | TOPICAL_CREAM | Freq: Two times a day (BID) | CUTANEOUS | 0 refills | Status: DC

## 2018-10-18 NOTE — Patient Instructions (Signed)
Standing Labs We placed an order today for your standing lab work.    Please come back and get your standing labs in November and every 3 months   We have open lab daily Monday through Thursday from 8:30-12:30 PM and 1:30-4:30 PM and Friday from 8:30-12:30 PM and 1:30 -4:00 PM at the office of Dr. Airianna Kreischer.   You may experience shorter wait times on Monday and Friday afternoons. The office is located at [address], [address], [place], [place] [address] No appointment is necessary.   Labs are drawn by [facility].  You may receive a bill from [facility] for your lab work.  If you wish to have your labs drawn at another location, please call the office 24 hours in advance to send orders.  If you have any questions regarding directions or hours of operation,  please call [phone].   Just as a reminder please drink plenty of water prior to coming for your lab work. Thanks 

## 2018-12-14 ENCOUNTER — Other Ambulatory Visit: Payer: Self-pay | Admitting: Rheumatology

## 2018-12-14 DIAGNOSIS — L4050 Arthropathic psoriasis, unspecified: Secondary | ICD-10-CM

## 2018-12-14 NOTE — Telephone Encounter (Signed)
Last Visit: 10/18/18 Next Visit: 03/21/19 Labs: 09/15/19 LFTs are mildly elevated TB Gold: 02/03/18   Patient advised she is due for labs. Patient will update 12/15/18.  Okay to refill 30 day supply per Dr. Estanislado Pandy

## 2018-12-21 ENCOUNTER — Other Ambulatory Visit: Payer: Self-pay

## 2018-12-21 DIAGNOSIS — Z79899 Other long term (current) drug therapy: Secondary | ICD-10-CM

## 2018-12-22 LAB — COMPLETE METABOLIC PANEL WITH GFR
AG Ratio: 1.4 (calc) (ref 1.0–2.5)
ALT: 22 U/L (ref 6–29)
AST: 18 U/L (ref 10–35)
Albumin: 3.9 g/dL (ref 3.6–5.1)
Alkaline phosphatase (APISO): 75 U/L (ref 37–153)
BUN: 21 mg/dL (ref 7–25)
CO2: 29 mmol/L (ref 20–32)
Calcium: 9.5 mg/dL (ref 8.6–10.4)
Chloride: 105 mmol/L (ref 98–110)
Creat: 0.66 mg/dL (ref 0.50–1.05)
GFR, Est African American: 118 mL/min/{1.73_m2} (ref >=60)
GFR, Est Non African American: 102 mL/min/{1.73_m2} (ref >=60)
Globulin: 2.8 g/dL (calc) (ref 1.9–3.7)
Glucose, Bld: 88 mg/dL (ref 65–99)
Potassium: 4.2 mmol/L (ref 3.5–5.3)
Sodium: 141 mmol/L (ref 135–146)
Total Bilirubin: 0.4 mg/dL (ref 0.2–1.2)
Total Protein: 6.7 g/dL (ref 6.1–8.1)

## 2018-12-22 LAB — CBC WITH DIFFERENTIAL/PLATELET
Absolute Monocytes: 390 cells/uL (ref 200–950)
Basophils Absolute: 49 cells/uL (ref 0–200)
Basophils Relative: 0.8 %
Eosinophils Absolute: 67 cells/uL (ref 15–500)
Eosinophils Relative: 1.1 %
HCT: 41.8 % (ref 35.0–45.0)
Hemoglobin: 13.7 g/dL (ref 11.7–15.5)
Lymphs Abs: 2446 cells/uL (ref 850–3900)
MCH: 30.4 pg (ref 27.0–33.0)
MCHC: 32.8 g/dL (ref 32.0–36.0)
MCV: 92.9 fL (ref 80.0–100.0)
MPV: 8.9 fL (ref 7.5–12.5)
Monocytes Relative: 6.4 %
Neutro Abs: 3148 cells/uL (ref 1500–7800)
Neutrophils Relative %: 51.6 %
Platelets: 350 10*3/uL (ref 140–400)
RBC: 4.5 10*6/uL (ref 3.80–5.10)
RDW: 13.5 % (ref 11.0–15.0)
Total Lymphocyte: 40.1 %
WBC: 6.1 10*3/uL (ref 3.8–10.8)

## 2018-12-22 NOTE — Progress Notes (Signed)
Within normal limits

## 2018-12-28 ENCOUNTER — Other Ambulatory Visit: Payer: Self-pay | Admitting: *Deleted

## 2018-12-28 DIAGNOSIS — L4050 Arthropathic psoriasis, unspecified: Secondary | ICD-10-CM

## 2018-12-28 MED ORDER — OTREXUP 20 MG/0.4ML ~~LOC~~ SOAJ: 20.0000 mg | SUBCUTANEOUS | 0 refills | Status: DC

## 2018-12-28 NOTE — Telephone Encounter (Signed)
Refill request received via fax  Last Visit: 10/18/18 Next Visit: 03/21/19 Labs: 12/21/18 WNL  Okay to refill per Dr. Estanislado Pandy

## 2019-01-08 ENCOUNTER — Other Ambulatory Visit: Payer: Self-pay | Admitting: Rheumatology

## 2019-01-08 DIAGNOSIS — L4050 Arthropathic psoriasis, unspecified: Secondary | ICD-10-CM

## 2019-01-09 NOTE — Telephone Encounter (Signed)
Last Visit: 10/18/18 Next Visit: 03/21/19 Labs: 12/21/18 WNL TB Gold: 02/03/18 Neg   Okay to refill per Dr. Estanislado Pandy

## 2019-02-03 ENCOUNTER — Other Ambulatory Visit: Payer: Self-pay | Admitting: Neurology

## 2019-02-16 ENCOUNTER — Other Ambulatory Visit: Payer: Self-pay | Admitting: Neurology

## 2019-02-16 ENCOUNTER — Telehealth: Payer: Self-pay | Admitting: Neurology

## 2019-02-16 MED ORDER — TROKENDI XR 50 MG PO CP24: 3.0000 | ORAL_CAPSULE | Freq: Every day | ORAL | 0 refills | Status: DC

## 2019-02-16 NOTE — Telephone Encounter (Signed)
1) Medication(s) Requested (by name): Topiramate ER (TROKENDI XR) 50 MG  2) Pharmacy of Choice:  [facility]  3) Special Requests:   Patient would like to request a 90 day supply until her next appointment with Dr.Willis (07/10/19) patient declined to see NP.   Patient states everything has remained the same and has not had a seizure or any other issues states the medication is working great for her.

## 2019-02-16 NOTE — Telephone Encounter (Signed)
Sent refill to that pharmacy as requested. Patient only wanted to see MD and earliest availability was in April. Patient needs to keep upcoming apt for future refills.

## 2019-03-21 ENCOUNTER — Ambulatory Visit: Payer: BC Managed Care – PPO | Admitting: Rheumatology

## 2019-03-31 ENCOUNTER — Telehealth: Payer: Self-pay | Admitting: *Deleted

## 2019-03-31 NOTE — Telephone Encounter (Signed)
Labs from 02/28/19 in Care Everywhere LDL elevated CBC WNL CMP WNL

## 2019-04-05 ENCOUNTER — Other Ambulatory Visit: Payer: Self-pay | Admitting: *Deleted

## 2019-04-05 DIAGNOSIS — L4050 Arthropathic psoriasis, unspecified: Secondary | ICD-10-CM

## 2019-04-05 DIAGNOSIS — Z9225 Personal history of immunosupression therapy: Secondary | ICD-10-CM

## 2019-04-05 MED ORDER — SIMPONI 50 MG/0.5ML ~~LOC~~ SOAJ: 50.0000 mg | SUBCUTANEOUS | 0 refills | Status: DC

## 2019-04-05 NOTE — Telephone Encounter (Signed)
Ok to refill 30-day supply

## 2019-04-05 NOTE — Telephone Encounter (Signed)
Refill request received via fax  Last Visit: 10/18/18 Next Visit: 05/16/19 Labs: 02/28/19 LDL elevated CBC WNL CMP WNL TB Gold: 02/03/18 Neg   Patient is due to update her TB Gold. Patient will update tomorrow.  Okay to refill 30 day supply Simponi?

## 2019-04-06 ENCOUNTER — Telehealth: Payer: Self-pay | Admitting: Pharmacy Technician

## 2019-04-06 ENCOUNTER — Other Ambulatory Visit: Payer: Self-pay

## 2019-04-06 DIAGNOSIS — Z9225 Personal history of immunosupression therapy: Secondary | ICD-10-CM

## 2019-04-06 DIAGNOSIS — Z79899 Other long term (current) drug therapy: Secondary | ICD-10-CM

## 2019-04-06 NOTE — Telephone Encounter (Signed)
Received notification from [facility] regarding a prior authorization for [facility]. Authorization has been APPROVED from 04/06/19 to 04/04/22.   Will send document to scan center.  Authorization # [id number]

## 2019-04-06 NOTE — Telephone Encounter (Signed)
Submitted a Prior Authorization request to [facility] for [facility] via Cover My Meds. Will update once we receive a response.

## 2019-04-08 LAB — QUANTIFERON-TB GOLD PLUS
Mitogen-NIL: >10 IU/mL
NIL: 0.04 IU/mL
QuantiFERON-TB Gold Plus: NEGATIVE
TB1-NIL: 0.01 IU/mL
TB2-NIL: 0.01 IU/mL

## 2019-04-10 ENCOUNTER — Other Ambulatory Visit: Payer: Self-pay | Admitting: *Deleted

## 2019-04-10 DIAGNOSIS — L4050 Arthropathic psoriasis, unspecified: Secondary | ICD-10-CM

## 2019-04-10 MED ORDER — OTREXUP 20 MG/0.4ML ~~LOC~~ SOAJ: 20.0000 mg | SUBCUTANEOUS | 0 refills | Status: DC

## 2019-04-10 NOTE — Telephone Encounter (Signed)
Patient requested refill on Otrexup.  Last Visit: 10/18/18 Next Visit: 05/16/19  Labs: 02/28/19 LDL elevated CBC WNL CMP WNL  Okay to refill per Dr. Estanislado Pandy

## 2019-05-10 NOTE — Progress Notes (Signed)
Office Visit Note  Patient: Tiffany Wilkins             Date of Birth: June 17, 1965           MRN: [id number]             PCP: Rita Ohara, MD Referring: Rita Ohara, MD Visit Date: 05/16/2019 Occupation: _0 @  Subjective:  Joint stiffness   History of Present Illness: Tiffany Wilkins is a 54 y.o. female with history of psoriatic arthritis. She is on Simponi 50 mg sq every 28 days and Otrexup 20 mg sq injections every 7 days.    She states that she is due for her Simponi injection but has not received the delivery yet.  She states that typically a few days prior to her next injection she experiences increased arthralgias and joint stiffness.  She is having pain and stiffness in both hands and both feet.  She has intermittent pain in the right knee joint and is having pain on the lateral aspect of the right knee at this time.  She has not tried using Voltaren gel topically.  She states that she is having a flare of psoriasis in her umbilical region and gluteal region.  She requested a refill of clobetasol cream.  She says she is also having a vaginal yeast infection and requested a refill for Diflucan which has been effective in the past.  She denies any SI joint pain.  She denies any Achilles tendinitis  Activities of Daily Living:  Patient reports joint stiffness all day  Patient Reports nocturnal pain.  Difficulty dressing/grooming: Denies Difficulty climbing stairs: Reports Difficulty getting out of chair: Reports Difficulty using hands for taps, buttons, cutlery, and/or writing: Reports  Review of Systems  Constitutional: Positive for fatigue.  HENT: Negative for mouth sores, mouth dryness and nose dryness.   Eyes: Negative for pain, itching, visual disturbance and dryness.  Respiratory: Negative for cough, hemoptysis, shortness of breath and difficulty breathing.   Cardiovascular: Negative for chest pain, palpitations, hypertension and swelling in legs/feet.  Gastrointestinal:  Positive for constipation. Negative for blood in stool and diarrhea.  Endocrine: Negative for increased urination.  Genitourinary: Negative for difficulty urinating and painful urination.  Musculoskeletal: Positive for arthralgias, joint pain, joint swelling and morning stiffness. Negative for myalgias, muscle weakness, muscle tenderness and myalgias.  Skin: Positive for rash. Negative for color change, pallor, hair loss, nodules/bumps, skin tightness, ulcers and sensitivity to sunlight.  Allergic/Immunologic: Negative for susceptible to infections.  Neurological: Negative for dizziness, numbness, headaches, memory loss and weakness.  Hematological: Negative for bruising/bleeding tendency and swollen glands.  Psychiatric/Behavioral: Negative for depressed mood, confusion and sleep disturbance. The patient is not nervous/anxious.     PMFS History:  Patient Active Problem List   Diagnosis Date Noted  . Primary osteoarthritis of both knees 12/11/2016  . High risk medication use 06/30/2016  . Right hand pain 06/30/2016  . Right hip pain 06/30/2016  . Hepatic steatosis 06/27/2014  . BMI 50.0-59.9, adult ([place]) 06/27/2014  . Fibroid, uterine 06/27/2014  . Abdominal pain, chronic, right lower quadrant 06/27/2014  . Obesity hypoventilation syndrome ([place]) 12/08/2013  . Nocturnal seizures ([place]) 12/08/2013  . OSA (obstructive sleep apnea) 12/08/2013  . Noncompliance with CPAP treatment 12/08/2013  . Psoriasis 11/29/2013  . Seizure disorder, complex partial ([place]) 06/01/2012  . Migraine 06/01/2012  . OSA on CPAP 01/04/2012  . Hiatal hernia 03/26/2011  . Internal hemorrhoid 03/24/2011  . Reflux esophagitis 03/24/2011  . Psoriatic arthritis (  [place]) 01/22/2011  . Hernia of abdominal wall 01/22/2011    Past Medical History:  Diagnosis Date  . Allergic rhinitis, cause unspecified   . Anal fissure   . Arthritis    inflammatory and psoriatic--Dr. Estanislado Pandy  . Asthma    related to allergies and  colds  . Chronic bronchitis    gets bronchitis yearly with colds  . Chronic kidney disease kidney stones  . Fracture    stress fracture right foot  . GERD (gastroesophageal reflux disease)   . Hernia (acquired) (recurrent)    R lower abdomen; has seen [facility] (Dr. Zena Amos and recurred  . Hiatal hernia 03/2011  . Impaired fasting glucose   . Internal hemorrhoids 03/2011  . Menstrual migraine    Dr, Jannifer Franklin  . Obesity   . Reflux esophagitis 03/2011  . Seizure disorder ([place])    f/b Dr. Jannifer Franklin  . Seizures ([place]) last seisure june 2012  . Sleep apnea    inconclusive test per pt (some central and obstructive component)  . Sleep apnea 06/07/09   mild complex sleep apnea, worse in supine position (uses CPAP intermittently only)  . Tinea cruris 7/09    Family History  Problem Relation Age of Onset  . Arthritis Mother        rheumatoid  . Hyperlipidemia Mother   . Hypertension Mother   . Hyperthyroidism Mother        s/p thyroidectomy, now with hypothyroidism  . Cancer Father 41       AML, lung cancer, kidney cancer (all presented at same time)  . Diabetes Father   . Hypertension Father   . Hyperlipidemia Father   . Psoriasis Father   . Anxiety disorder Father        OCD, nervous breakdown  . Psoriasis Sister   . Irritable bowel syndrome Sister   . Arthritis Sister        psoriatic  . Diabetes Sister   . GER disease Daughter   . Asthma Son   . Asthma Maternal Grandmother   . Hyperlipidemia Maternal Grandmother   . Heart disease Maternal Grandmother   . Stroke Maternal Grandmother   . Diabetes Maternal Grandfather   . Heart disease Maternal Grandfather   . Cancer Paternal Grandmother 85       female (?ovarian)  . Diabetes Paternal Grandmother   . Hypertension Paternal Grandmother   . Diabetes Paternal Grandfather   . Heart disease Paternal Grandfather   . Hypertension Paternal Grandfather   . Diabetes Paternal Uncle   . Crohn's disease Paternal Uncle   . Cancer  Paternal Uncle        ? type  . Colitis Paternal Uncle   . Stroke Maternal Aunt   . Stroke Cousin   . Esophageal cancer Neg Hx   . Stomach cancer Neg Hx    Past Surgical History:  Procedure Laterality Date  . BARIATRIC SURGERY  09/22/2017  . CESAREAN SECTION     x2  . CHOLECYSTECTOMY  2005  . COLONOSCOPY  03/19/11   Dr. Olevia Perches; internal hemorrhoids  . ESOPHAGOGASTRODUODENOSCOPY  03/19/11   hiatal hernia, esophagitis  . KIDNEY STONE SURGERY  08/2010   retrieved with basket (at [facility])  . repair of incisional hernia  2005, 2008   related to laparoscopic cholecystectomy   Social History   Social History Narrative   Patient Lives with husband [name])  and 2 children (son and daughter, 56, 65)   Patient is right handed.  Patient has high school education.   Patient drinks 2 cups daily- coffee/soda            Immunization History  Administered Date(s) Administered  . Influenza Split 12/29/2010  . Influenza,inj,Quad PF,6+ Mos 11/29/2013  . Tdap 11/29/2013     Objective: Vital Signs: BP 126/83 (BP Location: Left Arm, Patient Position: Sitting, Cuff Size: Normal)   Pulse 69   Resp 14   Ht _0  (1.575 m)   Wt 222 lb 9.6 oz (101 kg)   LMP 06/12/2014   BMI 40.71 kg/m    Physical Exam Vitals and nursing note reviewed.  Constitutional:      Appearance: She is well-developed.  HENT:     Head: Normocephalic and atraumatic.  Eyes:     Conjunctiva/sclera: Conjunctivae normal.  Pulmonary:     Effort: Pulmonary effort is normal.  Abdominal:     General: Bowel sounds are normal.     Palpations: Abdomen is soft.  Musculoskeletal:     Cervical back: Normal range of motion.  Lymphadenopathy:     Cervical: No cervical adenopathy.  Skin:    General: Skin is warm and dry.     Capillary Refill: Capillary refill takes less than 2 seconds.  Neurological:     Mental Status: She is alert and oriented to person, place, and time.  Psychiatric:        Behavior: Behavior normal.       Musculoskeletal Exam: C-spine, thoracic spine, and lumbar spine good ROM.  No midline spinal tenderness.  No SI joint tenderness. Shoulder joints, elbow joints, wrist joints, MCPs, PIPs, and DIPs good ROM with no synovitis. Left 2nd and 3rd MCP joints and right 2nd MCP joint.  Hip joints, knee joints, ankle joints, MTPs, PIPs, and DIPs good ROM with no synovitis.  No warmth or effusion of knee joints.  No tenderness or swelling of ankle joints.    CDAI Exam: CDAI Score: -- Patient Global: --; Provider Global: -- Swollen: --; Tender: -- Joint Exam 05/16/2019   No joint exam has been documented for this visit   There is currently no information documented on the homunculus. Go to the Rheumatology activity and complete the homunculus joint exam.  Investigation: No additional findings.  Imaging: No results found.  Recent Labs: Lab Results  Component Value Date   WBC 6.1 12/21/2018   HGB 13.7 12/21/2018   PLT 350 12/21/2018   NA 141 12/21/2018   K 4.2 12/21/2018   CL 105 12/21/2018   CO2 29 12/21/2018   GLUCOSE 88 12/21/2018   BUN 21 12/21/2018   CREATININE 0.66 12/21/2018   BILITOT 0.4 12/21/2018   ALKPHOS 75 10/16/2016   AST 18 12/21/2018   ALT 22 12/21/2018   PROT 6.7 12/21/2018   ALBUMIN 4.2 10/16/2016   CALCIUM 9.5 12/21/2018   GFRAA 118 12/21/2018   QFTBGOLDPLUS NEGATIVE 04/06/2019    Speciality Comments: TB Gold: 10/16/16 Neg Failed Cosentyx-developed rash that sent her to the ER Does not want to try TNF-inhibitor due to neurologic risk since she has epilepsy  Procedures:  No procedures performed Allergies: Cosentyx [secukinumab], Hydrocodone, Vimpat [lacosamide], Amoxicillin, Codeine, Keppra [levetiracetam], Ketorolac tromethamine, Sulfa antibiotics, and Transderm-scop [scopolamine]   Assessment / Plan:     Visit Diagnoses: Psoriatic arthritis ([place]) - She has no synovitis or dactylitis on exam.  She has tenderness of the right second MCP and left second  and third MCP joints.  She has painful range of motion of the right  knee but no warmth or effusion was noted.  She has tenderness at the distal right IT band insertion site.  She was encouraged use Voltaren gel topically as needed for pain relief.  She has no Achilles tendinitis or plantar fasciitis at this time.  No SI joint tenderness.  She has clinically been doing well on Simponi 50 mg subcutaneous injections every 28 days, Otrexup 20 mg subcutaneous injections every 7 days, folic acid 2 mg by mouth daily.  She is due for her next Simponi injection but has not received the refill.  A sample of Simponi was provided today in the office and a refill was sent to the pharmacy.  She is having a flare of psoriasis in the umbilical and gluteal region.  A refill of clobetasol was sent to the pharmacy today.  She will continue on the current treatment regimen.  She was advised to notify us if she develops increased joint pain or joint swelling.  She will follow-up in the office in 5 months.  Plan: Golimumab ([place]) 59 MG/0.5ML SOAJ  Psoriasis - She has psoriasis in the umbilical and gluteal region today.  A refill of clobetasol sent to the pharmacy.  She will continue on Simponi and Otrexup as prescribed.  Plan: clobetasol cream (TEMOVATE) 0.05 %  High risk medication use - Simponi 50 mg every 28 days, Otrexup 20 mg every 7 days, and folic acid 1 mg 2 tablets daily.  CBC/CMP from [facility] within normal limits on 02/28/2019.  She is due to update CBC and CMP today.  Orders were released.  TB gold was negative on April 06, 2019.  She was advised to hold Simponi and Otrexup if she develops any signs or symptoms of an infection and to resume once the infection is completely cleared.  She was encouraged to receive the COVID-19 vaccination but she is apprehensive at this time. - Plan: COMPLETE METABOLIC PANEL WITH GFR, CBC with Differential/Platelet  HLA B27 positive  Yeast infection of the vagina: She experiences  recurrent vaginal yeast infections due to being immunosuppressed.  She requested a refill of Diflucan which has been effective in the past.  She was advised to take as directed and if her symptoms do not resolve or worsen she was advised to follow-up with her PCP or gynecologist.  Other medical conditions are listed as follows:  History of sleep apnea  History of asthma  History of abdominal hernia  History of cholecystectomy  History of hernia repair - x2   History of bronchitis  History of obesity  History of epilepsy - Dr. Jannifer Franklin   Family history of rheumatoid arthritis - mother.  Orders: Orders Placed This Encounter  Procedures  . COMPLETE METABOLIC PANEL WITH GFR  . CBC with Differential/Platelet   Meds ordered this encounter  Medications  . Golimumab (SIMPONI) 50 MG/0.5ML SOAJ    Sig: Inject 50 mg into the skin every 28 (twenty-eight) days.    Dispense:  1.5 mL    Refill:  0  . fluconazole (DIFLUCAN) 150 MG tablet    Sig: Take 1 tablet (150 mg) by mouth every 72 hours for recurrent yeast infection.    Dispense:  5 tablet    Refill:  0    Please dispense as written.  . clobetasol cream (TEMOVATE) 0.05 %    Sig: Apply 1 application topically 2 (two) times daily.    Dispense:  45 g    Refill:  0    Face-to-face time spent with patient  was 30 minutes. Greater than 50% of time was spent in counseling and coordination of care.  Follow-Up Instructions: Return in about 5 months (around 10/16/2019) for Psoriatic arthritis.   [name], PA-C  Note - This record has been created using Dragon software.  Chart creation errors have been sought, but may not always  have been located. Such creation errors do not reflect on  the standard of medical care.

## 2019-05-16 ENCOUNTER — Ambulatory Visit: Payer: BC Managed Care – PPO | Admitting: Physician Assistant

## 2019-05-16 ENCOUNTER — Other Ambulatory Visit: Payer: Self-pay

## 2019-05-16 ENCOUNTER — Encounter: Payer: Self-pay | Admitting: Physician Assistant

## 2019-05-16 VITALS — BP 126/83 | HR 69 | Resp 14 | Ht 62.0 in | Wt 222.6 lb

## 2019-05-16 DIAGNOSIS — Z8669 Personal history of other diseases of the nervous system and sense organs: Secondary | ICD-10-CM

## 2019-05-16 DIAGNOSIS — Z8639 Personal history of other endocrine, nutritional and metabolic disease: Secondary | ICD-10-CM

## 2019-05-16 DIAGNOSIS — B373 Candidiasis of vulva and vagina: Secondary | ICD-10-CM

## 2019-05-16 DIAGNOSIS — L4050 Arthropathic psoriasis, unspecified: Secondary | ICD-10-CM

## 2019-05-16 DIAGNOSIS — Z8719 Personal history of other diseases of the digestive system: Secondary | ICD-10-CM

## 2019-05-16 DIAGNOSIS — Z9889 Other specified postprocedural states: Secondary | ICD-10-CM

## 2019-05-16 DIAGNOSIS — Z1589 Genetic susceptibility to other disease: Secondary | ICD-10-CM | POA: Diagnosis not present

## 2019-05-16 DIAGNOSIS — Z8261 Family history of arthritis: Secondary | ICD-10-CM

## 2019-05-16 DIAGNOSIS — Z9049 Acquired absence of other specified parts of digestive tract: Secondary | ICD-10-CM

## 2019-05-16 DIAGNOSIS — Z8709 Personal history of other diseases of the respiratory system: Secondary | ICD-10-CM

## 2019-05-16 DIAGNOSIS — Z79899 Other long term (current) drug therapy: Secondary | ICD-10-CM | POA: Diagnosis not present

## 2019-05-16 DIAGNOSIS — B3731 Acute candidiasis of vulva and vagina: Secondary | ICD-10-CM

## 2019-05-16 DIAGNOSIS — L409 Psoriasis, unspecified: Secondary | ICD-10-CM

## 2019-05-16 MED ORDER — FLUCONAZOLE 150 MG PO TABS: ORAL_TABLET | ORAL | 0 refills | Status: DC

## 2019-05-16 MED ORDER — CLOBETASOL PROPIONATE 0.05 % EX CREA: 1.0000 "application " | TOPICAL_CREAM | Freq: Two times a day (BID) | CUTANEOUS | 0 refills | Status: DC

## 2019-05-16 MED ORDER — SIMPONI 50 MG/0.5ML ~~LOC~~ SOAJ: 50.0000 mg | SUBCUTANEOUS | 0 refills | Status: DC

## 2019-05-16 NOTE — Progress Notes (Signed)
Medication Samples have been provided to the patient.  Drug name: Simponi   Strength: 50mg     Qty: 1 LOT: [id number] Exp.Date: 01/14/2020  Dosing instructions: Inject 50mg  into the skin every 28 days.   The patient has been instructed regarding the correct time, dose, and frequency of taking this medication, including desired effects and most common side effects.   Franca Stakes C Ah[place] Edge 1:53 PM 05/16/2019

## 2019-05-17 LAB — CBC WITH DIFFERENTIAL/PLATELET
Absolute Monocytes: 372 cells/uL (ref 200–950)
Basophils Absolute: 61 cells/uL (ref 0–200)
Basophils Relative: 1 %
Eosinophils Absolute: 61 cells/uL (ref 15–500)
Eosinophils Relative: 1 %
HCT: 41.6 % (ref 35.0–45.0)
Hemoglobin: 13.7 g/dL (ref 11.7–15.5)
Lymphs Abs: 2769 cells/uL (ref 850–3900)
MCH: 30 pg (ref 27.0–33.0)
MCHC: 32.9 g/dL (ref 32.0–36.0)
MCV: 91.2 fL (ref 80.0–100.0)
MPV: 9 fL (ref 7.5–12.5)
Monocytes Relative: 6.1 %
Neutro Abs: 2837 cells/uL (ref 1500–7800)
Neutrophils Relative %: 46.5 %
Platelets: 361 10*3/uL (ref 140–400)
RBC: 4.56 10*6/uL (ref 3.80–5.10)
RDW: 13.1 % (ref 11.0–15.0)
Total Lymphocyte: 45.4 %
WBC: 6.1 10*3/uL (ref 3.8–10.8)

## 2019-05-17 LAB — COMPLETE METABOLIC PANEL WITH GFR
AG Ratio: 1.5 (calc) (ref 1.0–2.5)
ALT: 15 U/L (ref 6–29)
AST: 15 U/L (ref 10–35)
Albumin: 4.1 g/dL (ref 3.6–5.1)
Alkaline phosphatase (APISO): 74 U/L (ref 37–153)
BUN: 21 mg/dL (ref 7–25)
CO2: 30 mmol/L (ref 20–32)
Calcium: 9.4 mg/dL (ref 8.6–10.4)
Chloride: 104 mmol/L (ref 98–110)
Creat: 0.69 mg/dL (ref 0.50–1.05)
GFR, Est African American: 115 mL/min/{1.73_m2} (ref >=60)
GFR, Est Non African American: 99 mL/min/{1.73_m2} (ref >=60)
Globulin: 2.7 g/dL (calc) (ref 1.9–3.7)
Glucose, Bld: 133 mg/dL — ABNORMAL HIGH (ref 65–99)
Potassium: 4.9 mmol/L (ref 3.5–5.3)
Sodium: 142 mmol/L (ref 135–146)
Total Bilirubin: 0.4 mg/dL (ref 0.2–1.2)
Total Protein: 6.8 g/dL (ref 6.1–8.1)

## 2019-05-17 NOTE — Progress Notes (Signed)
Glucose is elevated-133.  Rest of CMP WNL.  CBC WNL.

## 2019-06-27 ENCOUNTER — Telehealth: Payer: Self-pay | Admitting: Rheumatology

## 2019-06-27 DIAGNOSIS — L4050 Arthropathic psoriasis, unspecified: Secondary | ICD-10-CM

## 2019-06-27 MED ORDER — OTREXUP 20 MG/0.4ML ~~LOC~~ SOAJ: 20.0000 mg | SUBCUTANEOUS | 0 refills | Status: DC

## 2019-06-27 MED ORDER — SIMPONI 50 MG/0.5ML ~~LOC~~ SOAJ: 50.0000 mg | SUBCUTANEOUS | 0 refills | Status: DC

## 2019-06-27 NOTE — Telephone Encounter (Signed)
Last Visit: 05/16/2019 Next Visit: 10/17/2019 Labs: 05/16/2019 Glucose is elevated-133. Rest of CMP WNL. CBC WNL TB Gold: 04/06/2019 negative   Okay to refill per Dr. Estanislado Pandy.   Called patient and advised of pharmacy change. Patient verbalized understanding and also requested a refill of otrexup to be sent as well. Both otrexup and simponi have been sent to [facility].

## 2019-06-27 NOTE — Telephone Encounter (Signed)
Cindy from [facility] left a voicemail stating they are no longer the contracted pharmacy for the patient's prescription of Simponi.  The pharmacy that can fill the prescription is [facility] and they will need a new prescription.  Their contact information is:  Phone [phone]   Fax [phone]

## 2019-07-10 ENCOUNTER — Ambulatory Visit: Payer: BC Managed Care – PPO | Admitting: Neurology

## 2019-07-10 ENCOUNTER — Other Ambulatory Visit: Payer: Self-pay

## 2019-07-10 ENCOUNTER — Encounter: Payer: Self-pay | Admitting: Neurology

## 2019-07-10 VITALS — BP 124/72 | HR 66 | Ht 62.0 in | Wt 227.0 lb

## 2019-07-10 DIAGNOSIS — G43009 Migraine without aura, not intractable, without status migrainosus: Secondary | ICD-10-CM | POA: Diagnosis not present

## 2019-07-10 DIAGNOSIS — G40209 Localization-related (focal) (partial) symptomatic epilepsy and epileptic syndromes with complex partial seizures, not intractable, without status epilepticus: Secondary | ICD-10-CM | POA: Diagnosis not present

## 2019-07-10 MED ORDER — TROKENDI XR 50 MG PO CP24: 3.0000 | ORAL_CAPSULE | Freq: Every day | ORAL | 3 refills | Status: DC

## 2019-07-10 NOTE — Progress Notes (Signed)
Reason for visit: Seizures, headache  Tiffany Wilkins is an 54 y.o. female  History of present illness:  Tiffany Wilkins is a 54 year old right-handed white female with a history of obesity, psoriatic arthritis, seizures, and migraine headache.  The patient has missed 1 day of work since last seen because of headache, this was activated by allergies.  The patient normally has about 2 headaches a month and they are usually easily controlled with Aleve.  The patient is on Trokendi taken 150 mg at night.  She has had bariatric surgery without much success in weight loss.  She has not had any seizures in years.  The patient is immunosuppressed on methotrexate and Simponi, unfortunately the patient does not wish to get the Covid vaccination as she is concerned about the risk of this.  Past Medical History:  Diagnosis Date  . Allergic rhinitis, cause unspecified   . Anal fissure   . Arthritis    inflammatory and psoriatic--Dr. Estanislado Pandy  . Asthma    related to allergies and colds  . Chronic bronchitis    gets bronchitis yearly with colds  . Chronic kidney disease kidney stones  . Fracture    stress fracture right foot  . GERD (gastroesophageal reflux disease)   . Hernia (acquired) (recurrent)    R lower abdomen; has seen [facility] (Dr. Zena Amos and recurred  . Hiatal hernia 03/2011  . Impaired fasting glucose   . Internal hemorrhoids 03/2011  . Menstrual migraine    Dr, Jannifer Franklin  . Obesity   . Reflux esophagitis 03/2011  . Seizure disorder ([place])    f/b Dr. Jannifer Franklin  . Seizures ([place]) last seisure june 2012  . Sleep apnea    inconclusive test per pt (some central and obstructive component)  . Sleep apnea 06/07/09   mild complex sleep apnea, worse in supine position (uses CPAP intermittently only)  . Tinea cruris 7/09    Past Surgical History:  Procedure Laterality Date  . BARIATRIC SURGERY  09/22/2017  . CESAREAN SECTION     x2  . CHOLECYSTECTOMY  2005  . COLONOSCOPY  03/19/11   Dr.  Olevia Perches; internal hemorrhoids  . ESOPHAGOGASTRODUODENOSCOPY  03/19/11   hiatal hernia, esophagitis  . KIDNEY STONE SURGERY  08/2010   retrieved with basket (at [facility])  . repair of incisional hernia  2005, 2008   related to laparoscopic cholecystectomy    Family History  Problem Relation Age of Onset  . Arthritis Mother        rheumatoid  . Hyperlipidemia Mother   . Hypertension Mother   . Hyperthyroidism Mother        s/p thyroidectomy, now with hypothyroidism  . Cancer Father 41       AML, lung cancer, kidney cancer (all presented at same time)  . Diabetes Father   . Hypertension Father   . Hyperlipidemia Father   . Psoriasis Father   . Anxiety disorder Father        OCD, nervous breakdown  . Psoriasis Sister   . Irritable bowel syndrome Sister   . Arthritis Sister        psoriatic  . Diabetes Sister   . GER disease Daughter   . Asthma Son   . Asthma Maternal Grandmother   . Hyperlipidemia Maternal Grandmother   . Heart disease Maternal Grandmother   . Stroke Maternal Grandmother   . Diabetes Maternal Grandfather   . Heart disease Maternal Grandfather   . Cancer Paternal Grandmother 35  female (?ovarian)  . Diabetes Paternal Grandmother   . Hypertension Paternal Grandmother   . Diabetes Paternal Grandfather   . Heart disease Paternal Grandfather   . Hypertension Paternal Grandfather   . Diabetes Paternal Uncle   . Crohn's disease Paternal Uncle   . Cancer Paternal Uncle        ? type  . Colitis Paternal Uncle   . Stroke Maternal Aunt   . Stroke Cousin   . Esophageal cancer Neg Hx   . Stomach cancer Neg Hx     Social history:  reports that she has never smoked. She has never used smokeless tobacco. She reports previous alcohol use. She reports that she does not use drugs.    Allergies  Allergen Reactions  . Cosentyx [Secukinumab] Anaphylaxis  . Hydrocodone Itching and Palpitations  . Vimpat [Lacosamide] Itching    Itching  . Amoxicillin Hives  .  Codeine Itching and Other (See Comments)    Heart races.  . Keppra [Levetiracetam]     Suicide ideation  . Ketorolac Tromethamine Other (See Comments)    Stopped breathing.  . Sulfa Antibiotics Nausea Only  . Transderm-Scop [Scopolamine] Other (See Comments)    Stopped breathing.    Medications:  Prior to Admission medications   Medication Sig Start Date End Date Taking? Authorizing Provider  acetaminophen (TYLENOL) 500 MG tablet as needed.  09/24/17  Yes [provider]  albuterol (PROVENTIL HFA;VENTOLIN HFA) 108 (90 BASE) MCG/ACT inhaler Inhale 2 puffs into the lungs as needed.     Yes [provider]  BIOTIN PO Take by mouth daily.   Yes [provider]  CALCIUM PO Take by mouth daily.   Yes [provider]  clobetasol cream (TEMOVATE) [id number] % Apply 1 application topically 2 (two) times daily. 05/16/19  Yes [name], PA-C  Cyanocobalamin (VITAMIN B-12 PO) Take by mouth daily.   Yes [provider]  diclofenac sodium (VOLTAREN) 1 % GEL Apply 3 g to 3 large joints up to 3 times daily. 02/16/18  Yes Deveshwar, Abel Presto, MD  fluconazole (DIFLUCAN) 150 MG tablet Take 1 tablet (150 mg) by mouth every 72 hours for recurrent yeast infection. 05/16/19  Yes [name], PA-C  folic acid (FOLVITE) 1 MG tablet TAKE 2 TABLETS(2MG  TOTAL) BY MOUTH DAILY 09/28/18  Yes Deveshwar, Abel Presto, MD  Golimumab (SIMPONI) 50 MG/0.5ML SOAJ Inject 50 mg into the skin every 28 (twenty-eight) days. 06/27/19  Yes Deveshwar, Abel Presto, MD  ibuprofen (ADVIL,MOTRIN) 200 MG tablet Take 400 mg by mouth every 6 (six) hours as needed.    Yes [provider]  Methotrexate, PF, (OTREXUP) 20 MG/0.4ML SOAJ Inject 20 mg into the skin once a week. 06/27/19  Yes Deveshwar, Abel Presto, MD  Multiple Minerals-Vitamins (CALCIUM-MAGNESIUM-ZINC-D3) TABS Take 2 tablets by mouth daily.   Yes [provider]  OVER THE COUNTER MEDICATION daily.   Yes [provider]  pantoprazole  (PROTONIX) 40 MG tablet Take 40 mg by mouth daily. 09/24/17  Yes [provider]  SENNA PO Take by mouth as needed.   Yes [provider]  Topiramate ER (TROKENDI XR) 50 MG CP24 Take 3 capsules by mouth at bedtime. 02/16/19  Yes Kathrynn Ducking, MD  VITAMIN D PO Take by mouth daily.   Yes [provider]    ROS:  Out of a complete 14 system review of symptoms, the patient complains only of the following symptoms, and all other reviewed systems are negative.  Joint pain  Weight gain Headache  Blood pressure 124/72, pulse 66, height 5\' 2"  (1.575 m), weight 227 lb (103 kg), last menstrual period 06/12/2014.  Physical Exam  General: The patient is alert and cooperative at the time of the examination.  The patient is obese.  Skin: No significant peripheral edema is noted.   Neurologic Exam  Mental status: The patient is alert and oriented x 3 at the time of the examination. The patient has apparent normal recent and remote memory, with an apparently normal attention span and concentration ability.   Cranial nerves: Facial symmetry is present. Speech is normal, no aphasia or dysarthria is noted. Extraocular movements are full. Visual fields are full.  Motor: The patient has good strength in all 4 extremities.  Sensory examination: Soft touch sensation is symmetric on the face, arms, and legs.  Coordination: The patient has good finger-nose-finger and heel-to-shin bilaterally.  Gait and station: The patient has a normal gait. Tandem gait is normal. Romberg is negative. No drift is seen.  Reflexes: Deep tendon reflexes are symmetric.   Assessment/Plan:  1.  Migraine headache  2.  History of seizures, well controlled  The patient is doing well with her headaches and her seizures, she will continue the Trokendi, a prescription was sent in.  She will follow-up here in 1 year.  I had a discussion with the patient regarding the Covid vaccination, I strongly  recommended that she get this as she is immunosuppressed and has other risk factors for increased mortality if she does get the virus.  Greater than 50% of the visit was spent in counseling and coordination of care.  Face-to-face time with the patient was 20 minutes.   Jill Alexanders MD 07/10/2019 7:19 AM  [facility] [address] [place],  [address]  Phone [phone] Fax [phone]

## 2019-09-04 ENCOUNTER — Other Ambulatory Visit: Payer: Self-pay | Admitting: *Deleted

## 2019-09-04 DIAGNOSIS — L4050 Arthropathic psoriasis, unspecified: Secondary | ICD-10-CM

## 2019-09-04 MED ORDER — OTREXUP 20 MG/0.4ML ~~LOC~~ SOAJ: 20.0000 mg | SUBCUTANEOUS | 0 refills | Status: DC

## 2019-09-04 NOTE — Telephone Encounter (Signed)
Refill request received via fax  Last Visit: 05/16/2019 Next Visit: 10/17/2019 Labs: 05/16/2019 Glucose is elevated-133. Rest of CMP WNL. CBC WNL  Current Dose per office note 05/16/2019: Otrexup 20 mg every 7 days DX:  Psoriatic arthritis   Left message to advise patient she is due to update labs.   Okay to refill 30 day supply Otrexup?

## 2019-09-05 ENCOUNTER — Other Ambulatory Visit: Payer: Self-pay | Admitting: Rheumatology

## 2019-09-05 DIAGNOSIS — L4050 Arthropathic psoriasis, unspecified: Secondary | ICD-10-CM

## 2019-09-05 NOTE — Telephone Encounter (Signed)
Last Visit: 05/16/2019 Next Visit: 10/17/2019 Labs: 05/16/2019 Glucose is elevated-133. Rest of CMP WNL. CBC WNL Tb Gold: 04/06/2019 Neg   Current Dose per office note 05/16/2019: Simponi 50 mg subcutaneous injections every 28 days DX: Psoriatic arthritis   Left message to advise patient she is due to update labs.  Okay to refill Simponi?

## 2019-09-06 ENCOUNTER — Other Ambulatory Visit: Payer: Self-pay | Admitting: *Deleted

## 2019-09-06 DIAGNOSIS — Z79899 Other long term (current) drug therapy: Secondary | ICD-10-CM

## 2019-09-07 LAB — CBC WITH DIFFERENTIAL/PLATELET
Absolute Monocytes: 564 cells/uL (ref 200–950)
Basophils Absolute: 48 cells/uL (ref 0–200)
Basophils Relative: 0.7 %
Eosinophils Absolute: 27 cells/uL (ref 15–500)
Eosinophils Relative: 0.4 %
HCT: 40.1 % (ref 35.0–45.0)
Hemoglobin: 13.2 g/dL (ref 11.7–15.5)
Lymphs Abs: 2088 cells/uL (ref 850–3900)
MCH: 30.5 pg (ref 27.0–33.0)
MCHC: 32.9 g/dL (ref 32.0–36.0)
MCV: 92.6 fL (ref 80.0–100.0)
MPV: 9.1 fL (ref 7.5–12.5)
Monocytes Relative: 8.3 %
Neutro Abs: 4073 cells/uL (ref 1500–7800)
Neutrophils Relative %: 59.9 %
Platelets: 373 10*3/uL (ref 140–400)
RBC: 4.33 10*6/uL (ref 3.80–5.10)
RDW: 13.4 % (ref 11.0–15.0)
Total Lymphocyte: 30.7 %
WBC: 6.8 10*3/uL (ref 3.8–10.8)

## 2019-09-07 LAB — COMPLETE METABOLIC PANEL WITH GFR
AG Ratio: 1.5 (calc) (ref 1.0–2.5)
ALT: 12 U/L (ref 6–29)
AST: 15 U/L (ref 10–35)
Albumin: 4 g/dL (ref 3.6–5.1)
Alkaline phosphatase (APISO): 79 U/L (ref 37–153)
BUN: 21 mg/dL (ref 7–25)
CO2: 27 mmol/L (ref 20–32)
Calcium: 9.3 mg/dL (ref 8.6–10.4)
Chloride: 103 mmol/L (ref 98–110)
Creat: 0.78 mg/dL (ref 0.50–1.05)
GFR, Est African American: 101 mL/min/{1.73_m2} (ref >=60)
GFR, Est Non African American: 87 mL/min/{1.73_m2} (ref >=60)
Globulin: 2.7 g/dL (calc) (ref 1.9–3.7)
Glucose, Bld: 115 mg/dL — ABNORMAL HIGH (ref 65–99)
Potassium: 4.5 mmol/L (ref 3.5–5.3)
Sodium: 139 mmol/L (ref 135–146)
Total Bilirubin: 0.3 mg/dL (ref 0.2–1.2)
Total Protein: 6.7 g/dL (ref 6.1–8.1)

## 2019-09-07 NOTE — Progress Notes (Signed)
Lab are within normal limits.

## 2019-10-04 NOTE — Progress Notes (Deleted)
Office Visit Note  Patient: Tiffany Wilkins             Date of Birth: 06/02/1965           MRN: [id number]             PCP: Kathe Becton, DO Referring: Rita Ohara, MD Visit Date: 10/17/2019 Occupation: @GUAROCC @  Subjective:  No chief complaint on file.   History of Present Illness: Tiffany Wilkins is a 54 y.o. female ***   Activities of Daily Living:  Patient reports morning stiffness for *** {minute/hour:19697}.   Patient {ACTIONS;DENIES/REPORTS:21021675::"Denies"} nocturnal pain.  Difficulty dressing/grooming: {ACTIONS;DENIES/REPORTS:21021675::"Denies"} Difficulty climbing stairs: {ACTIONS;DENIES/REPORTS:21021675::"Denies"} Difficulty getting out of chair: {ACTIONS;DENIES/REPORTS:21021675::"Denies"} Difficulty using hands for taps, buttons, cutlery, and/or writing: {ACTIONS;DENIES/REPORTS:21021675::"Denies"}  No Rheumatology ROS completed.   PMFS History:  Patient Active Problem List   Diagnosis Date Noted  . Primary osteoarthritis of both knees 12/11/2016  . High risk medication use 06/30/2016  . Right hand pain 06/30/2016  . Right hip pain 06/30/2016  . Hepatic steatosis 06/27/2014  . BMI 50.0-59.9, adult ([place]) 06/27/2014  . Fibroid, uterine 06/27/2014  . Abdominal pain, chronic, right lower quadrant 06/27/2014  . Obesity hypoventilation syndrome ([place]) 12/08/2013  . Nocturnal seizures ([place]) 12/08/2013  . OSA (obstructive sleep apnea) 12/08/2013  . Noncompliance with CPAP treatment 12/08/2013  . Psoriasis 11/29/2013  . Seizure disorder, complex partial ([place]) 06/01/2012  . Migraine 06/01/2012  . OSA on CPAP 01/04/2012  . Hiatal hernia 03/26/2011  . Internal hemorrhoid 03/24/2011  . Reflux esophagitis 03/24/2011  . Psoriatic arthritis ([place]) 01/22/2011  . Hernia of abdominal wall 01/22/2011    Past Medical History:  Diagnosis Date  . Allergic rhinitis, cause unspecified   . Anal fissure   . Arthritis    inflammatory and psoriatic--Dr. Estanislado Pandy  . Asthma     related to allergies and colds  . Chronic bronchitis    gets bronchitis yearly with colds  . Chronic kidney disease kidney stones  . Fracture    stress fracture right foot  . GERD (gastroesophageal reflux disease)   . Hernia (acquired) (recurrent)    R lower abdomen; has seen [facility] (Dr. Zena Amos and recurred  . Hiatal hernia 03/2011  . Impaired fasting glucose   . Internal hemorrhoids 03/2011  . Menstrual migraine    Dr, Jannifer Franklin  . Obesity   . Reflux esophagitis 03/2011  . Seizure disorder ([place])    f/b Dr. Jannifer Franklin  . Seizures ([place]) last seisure june 2012  . Sleep apnea    inconclusive test per pt (some central and obstructive component)  . Sleep apnea 06/07/09   mild complex sleep apnea, worse in supine position (uses CPAP intermittently only)  . Tinea cruris 7/09    Family History  Problem Relation Age of Onset  . Arthritis Mother        rheumatoid  . Hyperlipidemia Mother   . Hypertension Mother   . Hyperthyroidism Mother        s/p thyroidectomy, now with hypothyroidism  . Cancer Father 56       AML, lung cancer, kidney cancer (all presented at same time)  . Diabetes Father   . Hypertension Father   . Hyperlipidemia Father   . Psoriasis Father   . Anxiety disorder Father        OCD, nervous breakdown  . Psoriasis Sister   . Irritable bowel syndrome Sister   . Arthritis Sister        psoriatic  .  Diabetes Sister   . GER disease Daughter   . Asthma Son   . Asthma Maternal Grandmother   . Hyperlipidemia Maternal Grandmother   . Heart disease Maternal Grandmother   . Stroke Maternal Grandmother   . Diabetes Maternal Grandfather   . Heart disease Maternal Grandfather   . Cancer Paternal Grandmother 66       female (?ovarian)  . Diabetes Paternal Grandmother   . Hypertension Paternal Grandmother   . Diabetes Paternal Grandfather   . Heart disease Paternal Grandfather   . Hypertension Paternal Grandfather   . Diabetes Paternal Uncle   . Crohn's disease  Paternal Uncle   . Cancer Paternal Uncle        ? type  . Colitis Paternal Uncle   . Stroke Maternal Aunt   . Stroke Cousin   . Esophageal cancer Neg Hx   . Stomach cancer Neg Hx    Past Surgical History:  Procedure Laterality Date  . BARIATRIC SURGERY  09/22/2017  . CESAREAN SECTION     x2  . CHOLECYSTECTOMY  2005  . COLONOSCOPY  03/19/11   Dr. Olevia Perches; internal hemorrhoids  . ESOPHAGOGASTRODUODENOSCOPY  03/19/11   hiatal hernia, esophagitis  . KIDNEY STONE SURGERY  08/2010   retrieved with basket (at [facility])  . repair of incisional hernia  2005, 2008   related to laparoscopic cholecystectomy   Social History   Social History Narrative   Patient Lives with husband Tiffany Wilkins)  and 2 children (son and daughter, 78, 45)   Patient is right handed.   Patient has high school education.   Patient drinks 2 cups daily- coffee/soda            Immunization History  Administered Date(s) Administered  . Influenza Split 12/29/2010  . Influenza,inj,Quad PF,6+ Mos 11/29/2013  . Tdap 11/29/2013     Objective: Vital Signs: LMP 06/12/2014    Physical Exam   Musculoskeletal Exam: ***  CDAI Exam: CDAI Score: -- Patient Global: --; Provider Global: -- Swollen: --; Tender: -- Joint Exam 10/17/2019   No joint exam has been documented for this visit   There is currently no information documented on the homunculus. Go to the Rheumatology activity and complete the homunculus joint exam.  Investigation: No additional findings.  Imaging: No results found.  Recent Labs: Lab Results  Component Value Date   WBC 6.8 09/06/2019   HGB 13.2 09/06/2019   PLT 373 09/06/2019   NA 139 09/06/2019   K 4.5 09/06/2019   CL 103 09/06/2019   CO2 27 09/06/2019   GLUCOSE 115 (H) 09/06/2019   BUN 21 09/06/2019   CREATININE 0.78 09/06/2019   BILITOT 0.3 09/06/2019   ALKPHOS 75 10/16/2016   AST 15 09/06/2019   ALT 12 09/06/2019   PROT 6.7 09/06/2019   ALBUMIN 4.2 10/16/2016   CALCIUM 9.3  09/06/2019   GFRAA 101 09/06/2019   QFTBGOLDPLUS NEGATIVE 04/06/2019    Speciality Comments: TB Gold: 10/16/16 Neg Failed Cosentyx-developed rash that sent her to the ER Does not want to try TNF-inhibitor due to neurologic risk since she has epilepsy  Procedures:  No procedures performed Allergies: Cosentyx [secukinumab], Hydrocodone, Vimpat [lacosamide], Amoxicillin, Codeine, Keppra [levetiracetam], Ketorolac tromethamine, Sulfa antibiotics, and Transderm-scop [scopolamine]   Assessment / Plan:     Visit Diagnoses: No diagnosis found.  Orders: No orders of the defined types were placed in this encounter.  No orders of the defined types were placed in this encounter.   Face-to-face time  spent with patient was *** minutes. Greater than 50% of time was spent in counseling and coordination of care.  Follow-Up Instructions: No follow-ups on file.   [name], PA-C  Note - This record has been created using Dragon software.  Chart creation errors have been sought, but may not always  have been located. Such creation errors do not reflect on  the standard of medical care.

## 2019-10-17 ENCOUNTER — Ambulatory Visit: Payer: BC Managed Care – PPO | Admitting: Physician Assistant

## 2019-10-18 ENCOUNTER — Other Ambulatory Visit: Payer: Self-pay | Admitting: *Deleted

## 2019-10-18 DIAGNOSIS — L4050 Arthropathic psoriasis, unspecified: Secondary | ICD-10-CM

## 2019-10-18 MED ORDER — OTREXUP 20 MG/0.4ML ~~LOC~~ SOAJ: 20.0000 mg | SUBCUTANEOUS | 0 refills | Status: DC

## 2019-10-18 NOTE — Telephone Encounter (Signed)
Refill request received via fax  Last Visit: 05/16/2019 Next Visit: 10/23/2019 Labs: 09/06/2019 WNL  Current Dose per office note 05/16/2019: Otrexup 20 mg every 7 days DX: Psoriatic arthritis   Okay to refill per Dr. Estanislado Pandy

## 2019-10-18 NOTE — Progress Notes (Signed)
Office Visit Note  Patient: Tiffany Wilkins             Date of Birth: 1965/07/06           MRN: [id number]             PCP: Kathe Becton, DO Referring: Rita Ohara, MD Visit Date: 10/23/2019 Occupation: _0 @  Subjective:  Left wrist joint pain   History of Present Illness: Tiffany Wilkins is a 54 y.o. female with history of psoriatic arthritis and osteoarthritis. She is currently on Simponi 50 mg sq injections once every 28 days, otrexup 20 mg sq injections once weekly, and folic acid 2 mg po daily. She has been experiencing increased pain and swelling in the left wrist joint for the past 3-4 months.  She continues to have pain in both hands, and the left foot.  She denies any achilles tendonitis, plantar fasciitis, or SI joint pain.  She uses clobetasol topically as needed for management of psoriasis. She requested a refill today.    Activities of Daily Living:  Patient reports morning stiffness for 45 minutes.   Patient Reports nocturnal pain.  Difficulty dressing/grooming: Reports Difficulty climbing stairs: Reports Difficulty getting out of chair: Reports Difficulty using hands for taps, buttons, cutlery, and/or writing: Reports  Review of Systems  Constitutional: Negative for fatigue.  HENT: Negative for mouth sores, mouth dryness and nose dryness.   Eyes: Negative for itching and dryness.  Respiratory: Negative for shortness of breath and difficulty breathing.   Cardiovascular: Negative for chest pain and palpitations.  Gastrointestinal: Positive for constipation. Negative for blood in stool and diarrhea.  Endocrine: Negative for increased urination.  Genitourinary: Negative for difficulty urinating.  Musculoskeletal: Positive for arthralgias, joint pain, joint swelling and morning stiffness. Negative for myalgias, muscle tenderness and myalgias.  Skin: Positive for rash. Negative for color change.  Allergic/Immunologic: Negative for susceptible to infections.    Neurological: Negative for dizziness, numbness, headaches, memory loss and weakness.  Hematological: Negative for bruising/bleeding tendency.  Psychiatric/Behavioral: Negative for confusion.    PMFS History:  Patient Active Problem List   Diagnosis Date Noted  . Primary osteoarthritis of both knees 12/11/2016  . High risk medication use 06/30/2016  . Right hand pain 06/30/2016  . Right hip pain 06/30/2016  . Hepatic steatosis 06/27/2014  . BMI 50.0-59.9, adult ([place]) 06/27/2014  . Fibroid, uterine 06/27/2014  . Abdominal pain, chronic, right lower quadrant 06/27/2014  . Obesity hypoventilation syndrome ([place]) 12/08/2013  . Nocturnal seizures ([place]) 12/08/2013  . OSA (obstructive sleep apnea) 12/08/2013  . Noncompliance with CPAP treatment 12/08/2013  . Psoriasis 11/29/2013  . Seizure disorder, complex partial ([place]) 06/01/2012  . Migraine 06/01/2012  . OSA on CPAP 01/04/2012  . Hiatal hernia 03/26/2011  . Internal hemorrhoid 03/24/2011  . Reflux esophagitis 03/24/2011  . Psoriatic arthritis ([place]) 01/22/2011  . Hernia of abdominal wall 01/22/2011    Past Medical History:  Diagnosis Date  . Allergic rhinitis, cause unspecified   . Anal fissure   . Arthritis    inflammatory and psoriatic--Dr. Estanislado Pandy  . Asthma    related to allergies and colds  . Chronic bronchitis    gets bronchitis yearly with colds  . Chronic kidney disease kidney stones  . Fracture    stress fracture right foot  . GERD (gastroesophageal reflux disease)   . Hernia (acquired) (recurrent)    R lower abdomen; has seen [facility] (Dr. Zena Amos and recurred  . Hiatal hernia 03/2011  .  Impaired fasting glucose   . Internal hemorrhoids 03/2011  . Menstrual migraine    Dr, Willis  . Obesity   . Reflux esophagitis 03/2011  . Seizure disorder ([facility])    f/b Dr. Willis  . Seizures ([facility]) last seisure june 2012  . Sleep apnea    inconclusive test per pt (some central and obstructive component)  . Sleep apnea  06/07/09   mild complex sleep apnea, worse in supine position (uses CPAP intermittently only)  . Tinea cruris 7/09    Family History  Problem Relation Age of Onset  . Arthritis Mother        rheumatoid  . Hyperlipidemia Mother   . Hypertension Mother   . Hyperthyroidism Mother        s/p thyroidectomy, now with hypothyroidism  . Cancer Father 69       AML, lung cancer, kidney cancer (all presented at same time)  . Diabetes Father   . Hypertension Father   . Hyperlipidemia Father   . Psoriasis Father   . Anxiety disorder Father        OCD, nervous breakdown  . Psoriasis Sister   . Irritable bowel syndrome Sister   . Arthritis Sister        psoriatic  . Diabetes Sister   . GER disease Daughter   . Asthma Son   . Asthma Maternal Grandmother   . Hyperlipidemia Maternal Grandmother   . Heart disease Maternal Grandmother   . Stroke Maternal Grandmother   . Diabetes Maternal Grandfather   . Heart disease Maternal Grandfather   . Cancer Paternal Grandmother 93       female (?ovarian)  . Diabetes Paternal Grandmother   . Hypertension Paternal Grandmother   . Diabetes Paternal Grandfather   . Heart disease Paternal Grandfather   . Hypertension Paternal Grandfather   . Diabetes Paternal Uncle   . Crohn's disease Paternal Uncle   . Cancer Paternal Uncle        ? type  . Colitis Paternal Uncle   . Stroke Maternal Aunt   . Stroke Cousin   . Esophageal cancer Neg Hx   . Stomach cancer Neg Hx    Past Surgical History:  Procedure Laterality Date  . BARIATRIC SURGERY  09/22/2017  . CESAREAN SECTION     x2  . CHOLECYSTECTOMY  2005  . COLONOSCOPY  03/19/11   Dr. Brodie; internal hemorrhoids  . ESOPHAGOGASTRODUODENOSCOPY  03/19/11   hiatal hernia, esophagitis  . KIDNEY STONE SURGERY  08/2010   retrieved with basket (at [place])  . repair of incisional hernia  2005, 2008   related to laparoscopic cholecystectomy   Social History   Social History Narrative   Patient Lives  with husband ( Tom)  and 2 children (son and daughter, 16, 14)   Patient is right handed.   Patient has high school education.   Patient drinks 2 cups daily- coffee/soda            Immunization History  Administered Date(s) Administered  . Influenza Split 12/29/2010  . Influenza,inj,Quad PF,6+ Mos 11/29/2013  . Tdap 11/29/2013     Objective: Vital Signs: BP 116/85 (BP Location: Left Arm, Patient Position: Sitting, Cuff Size: Normal)   Pulse 64   Resp 16   Ht 5' 2" (1.575 m)   Wt 227 lb 3.2 oz (103.1 kg)   LMP 06/12/2014   BMI 41.56 kg/m    Physical Exam Vitals and nursing note reviewed.  Constitutional:        Appearance: She is well-developed.  HENT:     Head: Normocephalic and atraumatic.  Eyes:     Conjunctiva/sclera: Conjunctivae normal.  Pulmonary:     Effort: Pulmonary effort is normal.  Abdominal:     General: Bowel sounds are normal.     Palpations: Abdomen is soft.  Musculoskeletal:     Cervical back: Normal range of motion.  Lymphadenopathy:     Cervical: No cervical adenopathy.  Skin:    General: Skin is warm and dry.     Capillary Refill: Capillary refill takes less than 2 seconds.  Neurological:     Mental Status: She is alert and oriented to person, place, and time.  Psychiatric:        Behavior: Behavior normal.      Musculoskeletal Exam: C-spine good ROM.  Shoulder joints and elbow joints good ROM.  Tenderness and inflammation of the left wrist joint. Incomplete left fist formation. Ankylosis of right 1st PIP joint. Left 2nd MCP and PIP joint tenderness and synovitis.  Hip joints good ROM with no discomfort.  Knee joints good ROM with no warmth or effusion.  Ankle joints good ROM with no discomfort.  Warmth of left ankle joint. Pedal edema bilaterally.   CDAI Exam: CDAI Score: -- Patient Global: --; Provider Global: -- Swollen: 4 ; Tender: 4  Joint Exam 10/23/2019      Right  Left  Wrist     Swollen Tender  MCP 2     Swollen Tender  PIP 2      Swollen Tender  Ankle     Swollen Tender     Investigation: No additional findings.  Imaging: XR Ankle 2 Views Left  Result Date: 10/23/2019 Mild tibiotalar joint space narrowing was noted.  Subtalar joint space narrowing was noted.  Inferior calcaneal spur was noted. Impression: These findings are consistent with inflammatory arthritis.  XR Foot 2 Views Left  Result Date: 10/23/2019 First MTP, PIP and DIP narrowing was noted.  Metatarsal tarsal joint narrowing was noted.  Erosive versus cystic changes noted in almost all MTP joints.  Dorsal spurring was noted.  Inferior calcaneal spur was noted.  Impression: These findings are consistent with psoriatic arthritis and osteoarthritis of the foot.  XR Foot 2 Views Right  Result Date: 10/23/2019 PIP and DIP narrowing was noted.  First MTP narrowing was noted.  Dorsal spurring was noted.  Metatarsal tarsal joint narrowing was noted.  Inferior calcaneal spur was noted.  Erosive changes were noted in first, second and possibly third and fourth MTPs. Impression: These findings are consistent with osteoarthritis of the foot.  XR Hand 2 View Left  Result Date: 10/23/2019 Narrowing of all MCP joints was noted.  Erosive changes were noted in first second and third MCP joints.  Narrowing of all PIP and DIP joints was noted.  Erosive changes were noted in the second third and fourth PIP joints.  Ankylosis of the first and second DIP joint was noted.  Narrowing of the DIP joints was noted.  No intercarpal radiocarpal joint space narrowing was noted. Impression: These findings are consistent with erosive psoriatic arthritis and osteoarthritis overlap.  XR Hand 2 View Right  Result Date: 10/23/2019 First MCP narrowing and erosive changes were noted.  None of the other MCP joint showed narrowing.  PIP and DIP narrowing was noted.  No intercarpal radiocarpal joint space narrowing was noted. Impression: These findings are consistent with erosive inflammatory  arthritis and osteoarthritis overlap.   Recent Labs: Lab  Results  Component Value Date   WBC 6.8 09/06/2019   HGB 13.2 09/06/2019   PLT 373 09/06/2019   NA 139 09/06/2019   K 4.5 09/06/2019   CL 103 09/06/2019   CO2 27 09/06/2019   GLUCOSE 115 (H) 09/06/2019   BUN 21 09/06/2019   CREATININE 0.78 09/06/2019   BILITOT 0.3 09/06/2019   ALKPHOS 75 10/16/2016   AST 15 09/06/2019   ALT 12 09/06/2019   PROT 6.7 09/06/2019   ALBUMIN 4.2 10/16/2016   CALCIUM 9.3 09/06/2019   GFRAA 101 09/06/2019   QFTBGOLDPLUS NEGATIVE 04/06/2019    Speciality Comments: TB Gold: 10/16/16 Neg Failed Cosentyx-developed rash that sent her to the ER Does not want to try TNF-inhibitor due to neurologic risk since she has epilepsy  Procedures:  No procedures performed Allergies: Cosentyx [secukinumab], Hydrocodone, Vimpat [lacosamide], Amoxicillin, Codeine, Keppra [levetiracetam], Ketorolac tromethamine, Sulfa antibiotics, and Transderm-scop [scopolamine]   Assessment / Plan:     Visit Diagnoses: Psoriatic arthritis (HCC) -She presents today with increased pain and inflammation in the left wrist, left hand, and left ankle joint. She has been having increased arthralgias and joint stiffness for the past 3-4 months.  She is currently on Simponi 50 mg sq injections every 28 days, otrexup 20 mg sq injections once weekly, and folic acid 2 mg po daily. She has not missed any doses recently.  Despite the flare she is having currently she feels that this combination of medications is working well her for her. She has not had any achilles tendonitis, plantar fasciitis, or SI joint pain recently.  X-rays of both hands and both feet were obtained today but we were unable to compare these images to assess for radiographic progression. Findings were consistent with erosive psoriatic arthritis and osteoarthritis.  She does not want to make any medication changes at this time.  She will continue on the current regimen.  A  prednisone taper starting at 20 mg tapering by 5 mg every 4 days was sent to the pharmacy. She will follow up in 5 months.    Plan: XR Hand 2 View Right, XR Hand 2 View Left, XR Foot 2 Views Right, XR Foot 2 Views Left  Psoriasis -She uses clobetasol cream topically as needed for management of psoriasis.  Refill was sent to the pharmacy today.  Plan: clobetasol cream (TEMOVATE) 0.05 %  High risk medication use - Simponi 50 mg sq every 28 days, Otrexup 20 mg every 7 days, and folic acid 1 mg 2 tablets daily. D/c cosentyx-rash.  CBC and CMP results from 09/06/2019 were reviewed with the patient today in the office.  She will be due to update lab work in September and every 3 months to monitor for drug toxicity.  TB gold negative on 04/06/2019. She has not had any recent infections.  She has not received the COVID-19 vaccination is apprehensive at this time.  We discussed that if she does develop a COVID-19 infection she is to notify us in order to receive the COVID-19 antibody infusion.  HLA B27 positive  Pain in left ankle and joints of left foot - She presents today with increased pain in the left ankle and foot.  She has tenderness at the base of the left fifth metatarsal.  Warmth of the left ankle joint was noted on exam.  X-rays of the left ankle were obtained today.  A prednisone taper starting at 40 mg tapering by 5 mg every 4 days was sent to the pharmacy.    Plan: XR Ankle 2 Views Left  Other medical conditions are listed as follows:   History of sleep apnea  History of asthma  History of abdominal hernia  History of cholecystectomy  History of hernia repair  History of epilepsy  Family history of rheumatoid arthritis    Orders: Orders Placed This Encounter  Procedures  . XR Hand 2 View Right  . XR Hand 2 View Left  . XR Foot 2 Views Right  . XR Foot 2 Views Left  . XR Ankle 2 Views Left   Meds ordered this encounter  Medications  . clobetasol cream (TEMOVATE) 0.05 %     Sig: Apply 1 application topically 2 (two) times daily.    Dispense:  45 g    Refill:  0  . predniSONE (DELTASONE) 5 MG tablet    Sig: Take 4 tablets by mouth daily x4 days, 3 tablets by mouth daily x4 days, 2 tablets by mouth daily x4 days, 1 tablet by mouth daily x4 days.    Dispense:  40 tablet    Refill:  0     Follow-Up Instructions: Return in about 5 months (around 03/24/2020) for Psoriatic arthritis, Osteoarthritis.   [name], PA-C  Note - This record has been created using Dragon software.  Chart creation errors have been sought, but may not always  have been located. Such creation errors do not reflect on  the standard of medical care.

## 2019-10-23 ENCOUNTER — Encounter: Payer: Self-pay | Admitting: Physician Assistant

## 2019-10-23 ENCOUNTER — Ambulatory Visit: Payer: Self-pay

## 2019-10-23 ENCOUNTER — Other Ambulatory Visit: Payer: Self-pay

## 2019-10-23 ENCOUNTER — Ambulatory Visit: Payer: BC Managed Care – PPO | Admitting: Physician Assistant

## 2019-10-23 VITALS — BP 116/85 | HR 64 | Resp 16 | Ht 62.0 in | Wt 227.2 lb

## 2019-10-23 DIAGNOSIS — M25572 Pain in left ankle and joints of left foot: Secondary | ICD-10-CM | POA: Diagnosis not present

## 2019-10-23 DIAGNOSIS — L409 Psoriasis, unspecified: Secondary | ICD-10-CM

## 2019-10-23 DIAGNOSIS — Z8669 Personal history of other diseases of the nervous system and sense organs: Secondary | ICD-10-CM

## 2019-10-23 DIAGNOSIS — Z1589 Genetic susceptibility to other disease: Secondary | ICD-10-CM

## 2019-10-23 DIAGNOSIS — Z8719 Personal history of other diseases of the digestive system: Secondary | ICD-10-CM

## 2019-10-23 DIAGNOSIS — Z8709 Personal history of other diseases of the respiratory system: Secondary | ICD-10-CM

## 2019-10-23 DIAGNOSIS — Z79899 Other long term (current) drug therapy: Secondary | ICD-10-CM | POA: Diagnosis not present

## 2019-10-23 DIAGNOSIS — Z9049 Acquired absence of other specified parts of digestive tract: Secondary | ICD-10-CM

## 2019-10-23 DIAGNOSIS — L4050 Arthropathic psoriasis, unspecified: Secondary | ICD-10-CM

## 2019-10-23 DIAGNOSIS — Z8261 Family history of arthritis: Secondary | ICD-10-CM

## 2019-10-23 DIAGNOSIS — Z9889 Other specified postprocedural states: Secondary | ICD-10-CM

## 2019-10-23 MED ORDER — PREDNISONE 5 MG PO TABS: ORAL_TABLET | ORAL | 0 refills | Status: DC

## 2019-10-23 MED ORDER — CLOBETASOL PROPIONATE 0.05 % EX CREA: 1.0000 "application " | TOPICAL_CREAM | Freq: Two times a day (BID) | CUTANEOUS | 0 refills | Status: DC

## 2019-10-23 NOTE — Patient Instructions (Addendum)
COVID-19 vaccine recommendations:   COVID-19 vaccine is recommended for everyone (unless you are allergic to a vaccine component), even if you are on a medication that suppresses your immune system.   If you are on Methotrexate, Cellcept (mycophenolate), Rinvoq, Mor[facility], and Olumiant- hold the medication for 1 week after each vaccine. Hold Methotrexate for 2 weeks after the single dose COVID-19 vaccine.   If you are on Orencia subcutaneous injection - hold medication one week prior to and one week after the first COVID-19 vaccine dose (only).   If you are on Orencia IV infusions- time vaccination administration so that the first COVID-19 vaccination will occur four weeks after the infusion and postpone the subsequent infusion by one week.   If you are on Cyclophosphamide or Rituxan infusions please contact your doctor prior to receiving the COVID-19 vaccine.   Do not take Tylenol or ant anti-inflammatory medications (NSAIDs) 24 hours prior to the COVID-19 vaccination.   There is no direct evidence about the efficacy of the COVID-19 vaccine in individuals who are on medications that suppress the immune system.   Even if you are fully vaccinated, and you are on any medications that suppress your immune system, please continue to wear a mask, maintain at least six feet social distance and practice hand hygiene.   If you develop a COVID-19 infection, please contact your PCP or our office to determine if you need antibody infusion.  We anticipate that a booster vaccine will be available soon for immunosuppressed individuals. Please cal our office before receiving your booster dose to make adjustments to your medication regimen.   Standing Labs We placed an order today for your standing lab work.   Please have your standing labs drawn in September and every 3 months  If possible, please have your labs drawn 2 weeks prior to your appointment so that the provider can discuss your results at your  appointment.  We have open lab daily Monday through Thursday from 8:30-12:30 PM and 1:30-4:30 PM and Friday from 8:30-12:30 PM and 1:30-4:00 PM at the office of Dr. Bo Merino, [facility].   Please be advised, patients with office appointments requiring lab work will take precedents over walk-in lab work.  If possible, please come for your lab work on Monday and Friday afternoons, as you may experience shorter wait times. The office is located at [address] [place], [place], [place] [address] No appointment is necessary.   Labs are drawn by [facility]. Please bring your co-pay at the time of your lab draw.  You may receive a bill from [facility] for your lab work.  If you wish to have your labs drawn at another location, please call the office 24 hours in advance to send orders.  If you have any questions regarding directions or hours of operation,  please call [phone].   As a reminder, please drink plenty of water prior to coming for your lab work. Thanks!

## 2019-11-01 ENCOUNTER — Other Ambulatory Visit: Payer: Self-pay | Admitting: Rheumatology

## 2019-11-01 DIAGNOSIS — L4050 Arthropathic psoriasis, unspecified: Secondary | ICD-10-CM

## 2019-11-01 NOTE — Telephone Encounter (Signed)
Last Visit: 10/23/2019 Next Visit: 03/25/2020 Labs: 09/06/2019 Lab are within normal limits. TB Gold: 04/06/2019 Neg   Current Dose per office note 10/23/2019: Simponi 50 mg sq every 28 days  DX:  Psoriatic arthritis   Okay to refill per Dr. Estanislado Pandy

## 2019-11-29 ENCOUNTER — Other Ambulatory Visit: Payer: Self-pay | Admitting: Rheumatology

## 2019-11-29 DIAGNOSIS — L4050 Arthropathic psoriasis, unspecified: Secondary | ICD-10-CM

## 2019-11-29 NOTE — Telephone Encounter (Signed)
Last Visit: 10/23/2019 Next Visit: 03/25/2020 Labs: 09/06/2019 Lab are within normal limits. TB Gold: 04/06/2019 Neg   Current Dose per office note 10/23/2019: Simponi 50 mgsqevery 28 days  DX: Psoriatic arthritis   Left message to advise patient she is due to update labs this month.   Okay to refill per Dr. Estanislado Pandy

## 2020-01-04 ENCOUNTER — Other Ambulatory Visit: Payer: Self-pay | Admitting: Rheumatology

## 2020-01-04 DIAGNOSIS — L4050 Arthropathic psoriasis, unspecified: Secondary | ICD-10-CM

## 2020-01-04 NOTE — Telephone Encounter (Signed)
Last Visit: 10/23/2019 Next Visit: 03/25/2020 Labs: 09/06/2019 Lab are within normal limits. TB Gold: 04/06/2019 Neg   Current Dose per office note 10/23/2019: Simponi 50 mgsqevery 28 days  DX: Psoriatic arthritis   Left message to advise patient she is due to update labs.  Okay to refill Simponi?

## 2020-01-04 NOTE — Telephone Encounter (Signed)
Please advise the patient to update lab work ASAP.  Ok to refill 30 day supply only.

## 2020-01-30 ENCOUNTER — Other Ambulatory Visit: Payer: Self-pay | Admitting: Physician Assistant

## 2020-01-30 DIAGNOSIS — L4050 Arthropathic psoriasis, unspecified: Secondary | ICD-10-CM

## 2020-01-31 NOTE — Telephone Encounter (Addendum)
Last Visit:10/23/2019 Next Visit:03/25/2020 Labs:6/23/2021Lab are within normal limits. TB Gold:04/06/2019 Neg  Current Dose per office note8/11/2019:Simponi 50 mgsqevery 28 days  HW:[id number] arthritis  Left message to advise patient she is due to update labs. Patient was due for labs in September 2021. Unable to refill until labs are updated.

## 2020-02-13 ENCOUNTER — Other Ambulatory Visit: Payer: Self-pay | Admitting: *Deleted

## 2020-02-13 ENCOUNTER — Telehealth: Payer: Self-pay

## 2020-02-13 DIAGNOSIS — Z9225 Personal history of immunosupression therapy: Secondary | ICD-10-CM

## 2020-02-13 DIAGNOSIS — Z111 Encounter for screening for respiratory tuberculosis: Secondary | ICD-10-CM

## 2020-02-13 DIAGNOSIS — Z79899 Other long term (current) drug therapy: Secondary | ICD-10-CM

## 2020-02-13 NOTE — Telephone Encounter (Signed)
FYI:  Patient called to let Dr. Estanislado Pandy know that she will be coming by the office today 02/13/20 to have labwork so her Simponi can be refilled.

## 2020-02-13 NOTE — Telephone Encounter (Signed)
Noted. Orders in place.

## 2020-02-14 ENCOUNTER — Telehealth: Payer: Self-pay | Admitting: *Deleted

## 2020-02-14 DIAGNOSIS — L4050 Arthropathic psoriasis, unspecified: Secondary | ICD-10-CM

## 2020-02-14 LAB — CBC WITH DIFFERENTIAL/PLATELET
Absolute Monocytes: 549 cells/uL (ref 200–950)
Basophils Absolute: 62 cells/uL (ref 0–200)
Basophils Relative: 1.1 %
Eosinophils Absolute: 39 cells/uL (ref 15–500)
Eosinophils Relative: 0.7 %
HCT: 40.4 % (ref 35.0–45.0)
Hemoglobin: 13.1 g/dL (ref 11.7–15.5)
Lymphs Abs: 1887 cells/uL (ref 850–3900)
MCH: 28.6 pg (ref 27.0–33.0)
MCHC: 32.4 g/dL (ref 32.0–36.0)
MCV: 88.2 fL (ref 80.0–100.0)
MPV: 9.2 fL (ref 7.5–12.5)
Monocytes Relative: 9.8 %
Neutro Abs: 3063 cells/uL (ref 1500–7800)
Neutrophils Relative %: 54.7 %
Platelets: 378 10*3/uL (ref 140–400)
RBC: 4.58 10*6/uL (ref 3.80–5.10)
RDW: 13.9 % (ref 11.0–15.0)
Total Lymphocyte: 33.7 %
WBC: 5.6 10*3/uL (ref 3.8–10.8)

## 2020-02-14 LAB — COMPLETE METABOLIC PANEL WITH GFR
AG Ratio: 1.4 (calc) (ref 1.0–2.5)
ALT: 14 U/L (ref 6–29)
AST: 15 U/L (ref 10–35)
Albumin: 3.9 g/dL (ref 3.6–5.1)
Alkaline phosphatase (APISO): 78 U/L (ref 37–153)
BUN: 16 mg/dL (ref 7–25)
CO2: 28 mmol/L (ref 20–32)
Calcium: 9.2 mg/dL (ref 8.6–10.4)
Chloride: 103 mmol/L (ref 98–110)
Creat: 0.67 mg/dL (ref 0.50–1.05)
GFR, Est African American: 115 mL/min/{1.73_m2} (ref >=60)
GFR, Est Non African American: 100 mL/min/{1.73_m2} (ref >=60)
Globulin: 2.7 g/dL (calc) (ref 1.9–3.7)
Glucose, Bld: 123 mg/dL (ref 65–139)
Potassium: 4.6 mmol/L (ref 3.5–5.3)
Sodium: 140 mmol/L (ref 135–146)
Total Bilirubin: 0.4 mg/dL (ref 0.2–1.2)
Total Protein: 6.6 g/dL (ref 6.1–8.1)

## 2020-02-14 MED ORDER — SIMPONI 50 MG/0.5ML ~~LOC~~ SOAJ: SUBCUTANEOUS | 0 refills | Status: DC

## 2020-02-14 NOTE — Telephone Encounter (Signed)
Last Visit:10/23/2019 Next Visit:03/25/2020 Labs: 02/13/2020 WNL TB Gold:04/06/2019 Neg  Current Dose per office note8/11/2019:Simponi 50 mgsqevery 28 days  ZZ:[id number] arthritis  Okay to refill per Dr. Estanislado Pandy

## 2020-02-14 NOTE — Progress Notes (Signed)
CBC and CMP are normal.

## 2020-02-14 NOTE — Telephone Encounter (Signed)
-----   Message from Bo Merino, MD sent at 02/14/2020  8:42 AM EST ----- CBC and CMP are normal.

## 2020-02-22 ENCOUNTER — Other Ambulatory Visit: Payer: Self-pay

## 2020-02-22 DIAGNOSIS — L4050 Arthropathic psoriasis, unspecified: Secondary | ICD-10-CM

## 2020-02-22 MED ORDER — SIMPONI 50 MG/0.5ML ~~LOC~~ SOAJ: SUBCUTANEOUS | 0 refills | Status: DC

## 2020-02-22 MED ORDER — OTREXUP 20 MG/0.4ML ~~LOC~~ SOAJ: 20.0000 mg | SUBCUTANEOUS | 0 refills | Status: DC

## 2020-02-22 NOTE — Telephone Encounter (Signed)
Attempted to contact patient and left message on machine to advise patient that prescription has been sent to [facility]. Advised patient to contact [facility] today so shipment can be shipped over night to her.

## 2020-02-22 NOTE — Telephone Encounter (Signed)
Refill request received via fax from [facility] for otrexup.   Last Visit: 10/23/2019 Next Visit: 03/25/2020 Labs: 02/13/2020 CBC and CMP are normal.  Current Dose per office note on 10/23/2019: Otrexup 20 mg every 7 days DX: Psoriatic arthritis  Okay to refill otrexup?

## 2020-02-22 NOTE — Telephone Encounter (Signed)
Will from [facility] called stating patient's Simponi was sent to [facility] which is the wrong pharmacy.  Will states patient is due for her injection today and requesting it be sent as a high priority to [facility].

## 2020-02-22 NOTE — Telephone Encounter (Signed)
simponi was sent to the incorrect pharmacy on 02/14/2020. Please send rx to [facility].   Last Visit: 10/23/2019 Next Visit: 03/25/2020 Labs: 02/13/2020 CBC and CMP are normal. TB Gold: 04/06/2019 negative   Current Dose per office note on 10/23/2019:  Simponi 50 mg sq every 28 days DX: Psoriatic arthritis   Okay to refill simponi?

## 2020-03-13 NOTE — Progress Notes (Deleted)
Office Visit Note  Patient: Tiffany Wilkins             Date of Birth: 1965/09/19           MRN: [id number]             PCP: Laurena Bering, DO Referring: Laurena Bering, DO Visit Date: 03/25/2020 Occupation: @GUAROCC @  Subjective:  No chief complaint on file.   History of Present Illness: Tiffany Wilkins is a 54 y.o. female ***   Activities of Daily Living:  Patient reports morning stiffness for *** {minute/hour:19697}.   Patient {ACTIONS;DENIES/REPORTS:21021675::"Denies"} nocturnal pain.  Difficulty dressing/grooming: {ACTIONS;DENIES/REPORTS:21021675::"Denies"} Difficulty climbing stairs: {ACTIONS;DENIES/REPORTS:21021675::"Denies"} Difficulty getting out of chair: {ACTIONS;DENIES/REPORTS:21021675::"Denies"} Difficulty using hands for taps, buttons, cutlery, and/or writing: {ACTIONS;DENIES/REPORTS:21021675::"Denies"}  No Rheumatology ROS completed.   PMFS History:  Patient Active Problem List   Diagnosis Date Noted  . Primary osteoarthritis of both knees 12/11/2016  . High risk medication use 06/30/2016  . Right hand pain 06/30/2016  . Right hip pain 06/30/2016  . Hepatic steatosis 06/27/2014  . BMI 50.0-59.9, adult (HCC) 06/27/2014  . Fibroid, uterine 06/27/2014  . Abdominal pain, chronic, right lower quadrant 06/27/2014  . Obesity hypoventilation syndrome (HCC) 12/08/2013  . Nocturnal seizures (HCC) 12/08/2013  . OSA (obstructive sleep apnea) 12/08/2013  . Noncompliance with CPAP treatment 12/08/2013  . Psoriasis 11/29/2013  . Seizure disorder, complex partial (HCC) 06/01/2012  . Migraine 06/01/2012  . OSA on CPAP 01/04/2012  . Hiatal hernia 03/26/2011  . Internal hemorrhoid 03/24/2011  . Reflux esophagitis 03/24/2011  . Psoriatic arthritis (HCC) 01/22/2011  . Hernia of abdominal wall 01/22/2011    Past Medical History:  Diagnosis Date  . Allergic rhinitis, cause unspecified   . Anal fissure   . Arthritis    inflammatory and psoriatic--Dr. 13/10/2010  .  Asthma    related to allergies and colds  . Chronic bronchitis    gets bronchitis yearly with colds  . Chronic kidney disease kidney stones  . Fracture    stress fracture right foot  . GERD (gastroesophageal reflux disease)   . Hernia (acquired) (recurrent)    R lower abdomen; has seen [facility] (Dr. Corliss Skains and recurred  . Hiatal hernia 03/2011  . Impaired fasting glucose   . Internal hemorrhoids 03/2011  . Menstrual migraine    Dr, 04/2011  . Obesity   . Reflux esophagitis 03/2011  . Seizure disorder (HCC)    f/b Dr. 04/2011  . Seizures (HCC) last seisure june 2012  . Sleep apnea    inconclusive test per pt (some central and obstructive component)  . Sleep apnea 06/07/09   mild complex sleep apnea, worse in supine position (uses CPAP intermittently only)  . Tinea cruris 7/09    Family History  Problem Relation Age of Onset  . Arthritis Mother        rheumatoid  . Hyperlipidemia Mother   . Hypertension Mother   . Hyperthyroidism Mother        s/p thyroidectomy, now with hypothyroidism  . Cancer Father 52       AML, lung cancer, kidney cancer (all presented at same time)  . Diabetes Father   . Hypertension Father   . Hyperlipidemia Father   . Psoriasis Father   . Anxiety disorder Father        OCD, nervous breakdown  . Psoriasis Sister   . Irritable bowel syndrome Sister   . Arthritis Sister        psoriatic  .  Diabetes Sister   . GER disease Daughter   . Asthma Son   . Asthma Maternal Grandmother   . Hyperlipidemia Maternal Grandmother   . Heart disease Maternal Grandmother   . Stroke Maternal Grandmother   . Diabetes Maternal Grandfather   . Heart disease Maternal Grandfather   . Cancer Paternal Grandmother 193       female (?ovarian)  . Diabetes Paternal Grandmother   . Hypertension Paternal Grandmother   . Diabetes Paternal Grandfather   . Heart disease Paternal Grandfather   . Hypertension Paternal Grandfather   . Diabetes Paternal Uncle   . Crohn's  disease Paternal Uncle   . Cancer Paternal Uncle        ? type  . Colitis Paternal Uncle   . Stroke Maternal Aunt   . Stroke Cousin   . Esophageal cancer Neg Hx   . Stomach cancer Neg Hx    Past Surgical History:  Procedure Laterality Date  . BARIATRIC SURGERY  09/22/2017  . CESAREAN SECTION     x2  . CHOLECYSTECTOMY  2005  . COLONOSCOPY  03/19/11   Dr. Juanda ChanceBrodie; internal hemorrhoids  . ESOPHAGOGASTRODUODENOSCOPY  03/19/11   hiatal hernia, esophagitis  . KIDNEY STONE SURGERY  08/2010   [facility]  . repair of incisional hernia  2005, 2008   related to laparoscopic cholecystectomy   Social History   Social History Narrative   Patient Lives with husband Elijah Birk( [place])  and 2 children (son and daughter, [phone], [phone])   Patient is right handed.   Patient has high school education.   Patient drinks 2 cups daily- coffee/soda            Immunization History  Administered Date(s) Administered  . Influenza Split 12/29/2010  . Influenza,inj,Quad PF,6+ Mos 11/29/2013  . Tdap 11/29/2013     Objective: Vital Signs: LMP 06/12/2014    Physical Exam   Musculoskeletal Exam: ***  CDAI Exam: CDAI Score: -- Patient Global: --; Provider Global: -- Swollen: --; Tender: -- Joint Exam 03/25/2020   No joint exam has been documented for this visit   There is currently no information documented on the homunculus. Go to the Rheumatology activity and complete the homunculus joint exam.  Investigation: No additional findings.  Imaging: No results found.  Recent Labs: Lab Results  Component Value Date   WBC 5.6 02/13/2020   HGB 13.1 02/13/2020   PLT 378 02/13/2020   NA 140 02/13/2020   K 4.6 02/13/2020   CL 103 02/13/2020   CO2 28 02/13/2020   GLUCOSE 123 02/13/2020   BUN 16 02/13/2020   CREATININE 0.67 02/13/2020   BILITOT 0.4 02/13/2020   ALKPHOS 75 10/16/2016   AST 15 02/13/2020   ALT 14 02/13/2020   PROT 6.6 02/13/2020   ALBUMIN 4.2 10/16/2016   CALCIUM  9.2 02/13/2020   GFRAA 115 02/13/2020   QFTBGOLDPLUS NEGATIVE 04/06/2019    Speciality Comments: TB Gold: 10/16/16 Neg Failed Cosentyx-developed rash that sent her to the ER Does not want to try TNF-inhibitor due to neurologic risk since she has epilepsy  Procedures:  No procedures performed Allergies: Cosentyx [secukinumab], Hydrocodone, Vimpat [lacosamide], Amoxicillin, Codeine, Keppra [levetiracetam], Ketorolac tromethamine, Sulfa antibiotics, and Transderm-scop [scopolamine]   Assessment / Plan:     Visit Diagnoses: No diagnosis found.  Orders: No orders of the defined types were placed in this encounter.  No orders of the defined types were placed in this encounter.   Face-to-face time spent  with patient was *** minutes. Greater than 50% of time was spent in counseling and coordination of care.  Follow-Up Instructions: No follow-ups on file.   Earnestine Mealing, CMA  Note - This record has been created using Editor, commissioning.  Chart creation errors have been sought, but may not always  have been located. Such creation errors do not reflect on  the standard of medical care.

## 2020-03-25 ENCOUNTER — Ambulatory Visit: Payer: BC Managed Care – PPO | Admitting: Physician Assistant

## 2020-03-26 ENCOUNTER — Telehealth: Payer: Self-pay

## 2020-03-26 DIAGNOSIS — U071 COVID-19: Secondary | ICD-10-CM

## 2020-03-26 NOTE — Telephone Encounter (Signed)
Patient called stating she tested positive for COVID yesterday 03/25/20.  Patient states her PCP diagnosed her with COVID Pneumonia and prescribed steroids and antibiotics.  Patient requested a return call to discuss the antibody infusion.

## 2020-03-26 NOTE — Telephone Encounter (Signed)
Attempted to contact the patient and left message for patient to call the office.  

## 2020-03-26 NOTE — Telephone Encounter (Signed)
Spoke with patient and advised patient we will place referral for the Covid infusion Center. Patient advised if she qualifies it may take 48 hours before they reach out to her to schedule her. Patient advised to hold her Simponi and Methotrexate for 2-3 weeks after symptoms resolve. Patient states her symptoms started on 03/19/2020. Patient states she received her positive test on 03/25/2020. Patient is not taking any IV medications for a chronic condition. She has not recently had a bone marrow transplant.

## 2020-03-27 ENCOUNTER — Telehealth: Payer: Self-pay | Admitting: Adult Health

## 2020-03-27 NOTE — Telephone Encounter (Signed)
Called to discuss with patient about COVID-19 symptoms and the use of one of the available treatments for those with mild to moderate Covid symptoms and at a high risk of hospitalization.  Pt appears to qualify for outpatient treatment due to co-morbid conditions and/or a member of an at-risk group in accordance with the FDA Emergency Use Authorization. Unfortunately is outside symptom window for current treatment options . Tried to discuss with patient , she hung up phone on me.    Symptom onset: 03/18/20 Vaccinated: Not vaccinated.  Booster? No  Immunocompromised? Yes  Qualifiers: Yes     Sieara Bremer NP-C

## 2020-03-29 NOTE — Progress Notes (Deleted)
Office Visit Note  Patient: Tiffany Wilkins             Date of Birth: 02/25/1966           MRN: [id number]             PCP: Kathe Becton, DO Referring: Kathe Becton, DO Visit Date: 04/12/2020 Occupation: @GUAROCC @  Subjective:    History of Present Illness: Tiffany Wilkins is a 55 y.o. female with history of simponi 50 mg sq injections every 28 days, Otrexup 20 mg sq injections once weekly, and folic acid 2 mg po daily.    CBC and CMP updated on 02/13/20.  She will be due to update CBC and CMP at the end of February 2022. Standing orders for CBC and CMP are in place. TB gold negative on 04/06/19.  Due to update TB gold today.  Order released.   Activities of Daily Living:  Patient reports morning stiffness for *** {minute/hour:19697}.   Patient {ACTIONS;DENIES/REPORTS:21021675::"Denies"} nocturnal pain.  Difficulty dressing/grooming: {ACTIONS;DENIES/REPORTS:21021675::"Denies"} Difficulty climbing stairs: {ACTIONS;DENIES/REPORTS:21021675::"Denies"} Difficulty getting out of chair: {ACTIONS;DENIES/REPORTS:21021675::"Denies"} Difficulty using hands for taps, buttons, cutlery, and/or writing: {ACTIONS;DENIES/REPORTS:21021675::"Denies"}  No Rheumatology ROS completed.   PMFS History:  Patient Active Problem List   Diagnosis Date Noted  . Primary osteoarthritis of both knees 12/11/2016  . High risk medication use 06/30/2016  . Right hand pain 06/30/2016  . Right hip pain 06/30/2016  . Hepatic steatosis 06/27/2014  . BMI 50.0-59.9, adult ([place]) 06/27/2014  . Fibroid, uterine 06/27/2014  . Abdominal pain, chronic, right lower quadrant 06/27/2014  . Obesity hypoventilation syndrome ([place]) 12/08/2013  . Nocturnal seizures ([facility]) 12/08/2013  . OSA (obstructive sleep apnea) 12/08/2013  . Noncompliance with CPAP treatment 12/08/2013  . Psoriasis 11/29/2013  . Seizure disorder, complex partial ([place]) 06/01/2012  . Migraine 06/01/2012  . OSA on CPAP 01/04/2012  . Hiatal hernia  03/26/2011  . Internal hemorrhoid 03/24/2011  . Reflux esophagitis 03/24/2011  . Psoriatic arthritis ([place]) 01/22/2011  . Hernia of abdominal wall 01/22/2011    Past Medical History:  Diagnosis Date  . Allergic rhinitis, cause unspecified   . Anal fissure   . Arthritis    inflammatory and psoriatic--Dr. Estanislado Pandy  . Asthma    related to allergies and colds  . Chronic bronchitis    gets bronchitis yearly with colds  . Chronic kidney disease kidney stones  . Fracture    stress fracture right foot  . GERD (gastroesophageal reflux disease)   . Hernia (acquired) (recurrent)    R lower abdomen; has seen [facility] (Dr. Zena Amos and recurred  . Hiatal hernia 03/2011  . Impaired fasting glucose   . Internal hemorrhoids 03/2011  . Menstrual migraine    Dr, Jannifer Franklin  . Obesity   . Reflux esophagitis 03/2011  . Seizure disorder ([place])    f/b Dr. Jannifer Franklin  . Seizures ([place]) last seisure june 2012  . Sleep apnea    inconclusive test per pt (some central and obstructive component)  . Sleep apnea 06/07/09   mild complex sleep apnea, worse in supine position (uses CPAP intermittently only)  . Tinea cruris 7/09    Family History  Problem Relation Age of Onset  . Arthritis Mother        rheumatoid  . Hyperlipidemia Mother   . Hypertension Mother   . Hyperthyroidism Mother        s/p thyroidectomy, now with hypothyroidism  . Cancer Father 78       AML, lung  cancer, kidney cancer (all presented at same time)  . Diabetes Father   . Hypertension Father   . Hyperlipidemia Father   . Psoriasis Father   . Anxiety disorder Father        OCD, nervous breakdown  . Psoriasis Sister   . Irritable bowel syndrome Sister   . Arthritis Sister        psoriatic  . Diabetes Sister   . GER disease Daughter   . Asthma Son   . Asthma Maternal Grandmother   . Hyperlipidemia Maternal Grandmother   . Heart disease Maternal Grandmother   . Stroke Maternal Grandmother   . Diabetes Maternal Grandfather   .  Heart disease Maternal Grandfather   . Cancer Paternal Grandmother 67       female (?ovarian)  . Diabetes Paternal Grandmother   . Hypertension Paternal Grandmother   . Diabetes Paternal Grandfather   . Heart disease Paternal Grandfather   . Hypertension Paternal Grandfather   . Diabetes Paternal Uncle   . Crohn's disease Paternal Uncle   . Cancer Paternal Uncle        ? type  . Colitis Paternal Uncle   . Stroke Maternal Aunt   . Stroke Cousin   . Esophageal cancer Neg Hx   . Stomach cancer Neg Hx    Past Surgical History:  Procedure Laterality Date  . BARIATRIC SURGERY  09/22/2017  . CESAREAN SECTION     x2  . CHOLECYSTECTOMY  2005  . COLONOSCOPY  03/19/11   Dr. Olevia Perches; internal hemorrhoids  . ESOPHAGOGASTRODUODENOSCOPY  03/19/11   hiatal hernia, esophagitis  . KIDNEY STONE SURGERY  08/2010   retrieved with basket (at [facility])  . repair of incisional hernia  2005, 2008   related to laparoscopic cholecystectomy   Social History   Social History Narrative   Patient Lives with husband [name])  and 2 children (son and daughter, 56, 63)   Patient is right handed.   Patient has high school education.   Patient drinks 2 cups daily- coffee/soda            Immunization History  Administered Date(s) Administered  . Influenza Split 12/29/2010  . Influenza,inj,Quad PF,6+ Mos 11/29/2013  . Tdap 11/29/2013     Objective: Vital Signs: LMP 06/12/2014    Physical Exam   Musculoskeletal Exam: ***  CDAI Exam: CDAI Score: -- Patient Global: --; Provider Global: -- Swollen: --; Tender: -- Joint Exam 04/12/2020   No joint exam has been documented for this visit   There is currently no information documented on the homunculus. Go to the Rheumatology activity and complete the homunculus joint exam.  Investigation: No additional findings.  Imaging: No results found.  Recent Labs: Lab Results  Component Value Date   WBC 5.6 02/13/2020   HGB 13.1 02/13/2020   PLT 378  02/13/2020   NA 140 02/13/2020   K 4.6 02/13/2020   CL 103 02/13/2020   CO2 28 02/13/2020   GLUCOSE 123 02/13/2020   BUN 16 02/13/2020   CREATININE 0.67 02/13/2020   BILITOT 0.4 02/13/2020   ALKPHOS 75 10/16/2016   AST 15 02/13/2020   ALT 14 02/13/2020   PROT 6.6 02/13/2020   ALBUMIN 4.2 10/16/2016   CALCIUM 9.2 02/13/2020   GFRAA 115 02/13/2020   QFTBGOLDPLUS NEGATIVE 04/06/2019    Speciality Comments: TB Gold: 10/16/16 Neg Failed Cosentyx-developed rash that sent her to the ER Does not want to try TNF-inhibitor due to neurologic risk since she  has epilepsy  Procedures:  No procedures performed Allergies: Cosentyx [secukinumab], Hydrocodone, Vimpat [lacosamide], Amoxicillin, Codeine, Keppra [levetiracetam], Ketorolac tromethamine, Sulfa antibiotics, and Transderm-scop [scopolamine]   Assessment / Plan:     Visit Diagnoses: No diagnosis found.  Orders: No orders of the defined types were placed in this encounter.  No orders of the defined types were placed in this encounter.   Face-to-face time spent with patient was *** minutes. Greater than 50% of time was spent in counseling and coordination of care.  Follow-Up Instructions: No follow-ups on file.   Earnestine Mealing, CMA  Note - This record has been created using Editor, commissioning.  Chart creation errors have been sought, but may not always  have been located. Such creation errors do not reflect on  the standard of medical care.

## 2020-04-12 ENCOUNTER — Ambulatory Visit: Payer: BC Managed Care – PPO | Admitting: Physician Assistant

## 2020-04-12 DIAGNOSIS — Z8669 Personal history of other diseases of the nervous system and sense organs: Secondary | ICD-10-CM

## 2020-04-12 DIAGNOSIS — Z79899 Other long term (current) drug therapy: Secondary | ICD-10-CM

## 2020-04-12 DIAGNOSIS — M25572 Pain in left ankle and joints of left foot: Secondary | ICD-10-CM

## 2020-04-12 DIAGNOSIS — Z1589 Genetic susceptibility to other disease: Secondary | ICD-10-CM

## 2020-04-12 DIAGNOSIS — Z8709 Personal history of other diseases of the respiratory system: Secondary | ICD-10-CM

## 2020-04-12 DIAGNOSIS — Z8719 Personal history of other diseases of the digestive system: Secondary | ICD-10-CM

## 2020-04-12 DIAGNOSIS — L409 Psoriasis, unspecified: Secondary | ICD-10-CM

## 2020-04-12 DIAGNOSIS — Z8261 Family history of arthritis: Secondary | ICD-10-CM

## 2020-04-12 DIAGNOSIS — Z9049 Acquired absence of other specified parts of digestive tract: Secondary | ICD-10-CM

## 2020-04-12 DIAGNOSIS — L4050 Arthropathic psoriasis, unspecified: Secondary | ICD-10-CM

## 2020-04-15 NOTE — Progress Notes (Addendum)
Office Visit Note  Patient: Tiffany Wilkins             Date of Birth: [birth year]           MRN: [id number]             PCP: Kathe Becton, DO Referring: Kathe Becton, DO Visit Date: 04/19/2020 Occupation: @GUAROCC @  Subjective:  Medication monitoring   History of Present Illness: Tiffany Wilkins is a 55 y.o. female with history of psoriatic arthritis.  She is on simponi 50 mg sq injections every 30 days, otrexup 20 mg sq injections once weekly, and folic acid 2 mg po daily. Her most recent simponi and otrexup injections were 1 week ago. She was diagnosed with covid-19 at the beginning of January and had to hold Simponi and Otrexup for about 3 weeks.  She had a mild case of Covid and did not receive the monoclonal antibody infusion.  All of her symptoms have resolved.  Patient reports that while off of Simponi and Otrexup she developed increased pain and swelling in multiple joints.  She was having difficulty walking due to the severity of pain and stiffness.  She continues to have some residual pain and swelling in her right hand.  She states that the pain seems worse today since it is raining.  Overall she still feels a Symphony and Otrexup are very effective at managing her arthritis.  She does not want to make any medication changes at this time.  She states that she would like a refill of clobetasol cream since she is having a flare of psoriasis.     Activities of Daily Living:  Patient reports morning stiffness for 5-10 minutes.   Patient Denies nocturnal pain.  Difficulty dressing/grooming: Denies Difficulty climbing stairs: Reports Difficulty getting out of chair: Reports Difficulty using hands for taps, buttons, cutlery, and/or writing: Denies  Review of Systems  Constitutional: Positive for fatigue.  HENT: Negative for mouth sores, mouth dryness and nose dryness.   Eyes: Negative for pain, itching and dryness.  Respiratory: Negative for shortness of breath and difficulty  breathing.   Cardiovascular: Negative for chest pain and palpitations.  Gastrointestinal: Positive for constipation. Negative for blood in stool and diarrhea.  Endocrine: Negative for increased urination.  Genitourinary: Negative for difficulty urinating.  Musculoskeletal: Positive for arthralgias, joint pain, joint swelling, muscle weakness and morning stiffness. Negative for myalgias, muscle tenderness and myalgias.  Skin: Positive for rash. Negative for color change.  Allergic/Immunologic: Negative for susceptible to infections.  Neurological: Positive for headaches and weakness. Negative for dizziness, numbness and memory loss.  Hematological: Negative for bruising/bleeding tendency.  Psychiatric/Behavioral: Positive for sleep disturbance. Negative for confusion.    PMFS History:  Patient Active Problem List   Diagnosis Date Noted   Primary osteoarthritis of both knees 12/11/2016   High risk medication use 06/30/2016   Right hand pain 06/30/2016   Right hip pain 06/30/2016   Hepatic steatosis 06/27/2014   BMI 50.0-59.9, adult ([place]) 06/27/2014   Fibroid, uterine 06/27/2014   Abdominal pain, chronic, right lower quadrant 06/27/2014   Obesity hypoventilation syndrome ([place]) 12/08/2013   Nocturnal seizures ([place]) 12/08/2013   OSA (obstructive sleep apnea) 12/08/2013   Noncompliance with CPAP treatment 12/08/2013   Psoriasis 11/29/2013   Seizure disorder, complex partial ([place]) 06/01/2012   Migraine 06/01/2012   OSA on CPAP 01/04/2012   Hiatal hernia 03/26/2011   Internal hemorrhoid 03/24/2011   Reflux esophagitis 03/24/2011   Psoriatic arthritis ([place])  01/22/2011   Hernia of abdominal wall 01/22/2011    Past Medical History:  Diagnosis Date   Allergic rhinitis, cause unspecified    Anal fissure    Arthritis    inflammatory and psoriatic--Dr. Estanislado Pandy   Asthma    related to allergies and colds   Chronic bronchitis    gets bronchitis yearly with  colds   Chronic kidney disease kidney stones   Fracture    stress fracture right foot   GERD (gastroesophageal reflux disease)    Hernia (acquired) (recurrent)    R lower abdomen; has seen [facility] (Dr. Zena Amos and recurred   Hiatal hernia 03/2011   Impaired fasting glucose    Internal hemorrhoids 03/2011   Menstrual migraine    Dr, Jannifer Franklin   Obesity    Reflux esophagitis 03/2011   Seizure disorder ([place])    f/b Dr. Jannifer Franklin   Seizures [facility]) last seisure june 2012   Sleep apnea    inconclusive test per pt (some central and obstructive component)   Sleep apnea 06/07/09   mild complex sleep apnea, worse in supine position (uses CPAP intermittently only)   Tinea cruris 7/09    Family History  Problem Relation Age of Onset   Arthritis Mother        rheumatoid   Hyperlipidemia Mother    Hypertension Mother    Hyperthyroidism Mother        s/p thyroidectomy, now with hypothyroidism   Cancer Father 66       AML, lung cancer, kidney cancer (all presented at same time)   Diabetes Father    Hypertension Father    Hyperlipidemia Father    Psoriasis Father    Anxiety disorder Father        OCD, nervous breakdown   Psoriasis Sister    Irritable bowel syndrome Sister    Arthritis Sister        psoriatic   Diabetes Sister    GER disease Daughter    Asthma Son    Asthma Maternal Grandmother    Hyperlipidemia Maternal Grandmother    Heart disease Maternal Grandmother    Stroke Maternal Grandmother    Diabetes Maternal Grandfather    Heart disease Maternal Grandfather    Cancer Paternal Grandmother 46       female (?ovarian)   Diabetes Paternal Grandmother    Hypertension Paternal Grandmother    Diabetes Paternal Grandfather    Heart disease Paternal Grandfather    Hypertension Paternal Grandfather    Diabetes Paternal Uncle    Crohn's disease Paternal Uncle    Cancer Paternal Uncle        ? type   Colitis Paternal Uncle     Stroke Maternal Aunt    Stroke Cousin    Esophageal cancer Neg Hx    Stomach cancer Neg Hx    Past Surgical History:  Procedure Laterality Date   BARIATRIC SURGERY  09/22/2017   CESAREAN SECTION     x2   CHOLECYSTECTOMY  2005   COLONOSCOPY  03/19/11   Dr. Olevia Perches; internal hemorrhoids   ESOPHAGOGASTRODUODENOSCOPY  03/19/11   hiatal hernia, esophagitis   KIDNEY STONE SURGERY  08/2010   retrieved with basket (at [facility])   repair of incisional hernia  2005, 2008   related to laparoscopic cholecystectomy   Social History   Social History Narrative   Patient Lives with husband [name])  and 2 children (son and daughter, 32, 23)   Patient is right handed.   Patient  has high school education.   Patient drinks 2 cups daily- coffee/soda            Immunization History  Administered Date(s) Administered   Influenza Split 12/29/2010   Influenza,inj,Quad PF,6+ Mos 11/29/2013   Tdap 11/29/2013     Objective: Vital Signs: BP 120/83 (BP Location: Left Arm, Patient Position: Sitting, Cuff Size: Large)    Pulse 70    Resp 15    Ht 5' 2"  (1.575 m)    Wt 226 lb 6.4 oz (102.7 kg)    LMP 06/12/2014    BMI 41.41 kg/m    Physical Exam Vitals and nursing note reviewed.  Constitutional:      Appearance: She is well-developed and well-nourished.  HENT:     Head: Normocephalic and atraumatic.  Eyes:     Extraocular Movements: EOM normal.     Conjunctiva/sclera: Conjunctivae normal.  Cardiovascular:     Pulses: Intact distal pulses.  Pulmonary:     Effort: Pulmonary effort is normal.  Abdominal:     Palpations: Abdomen is soft.  Musculoskeletal:     Cervical back: Normal range of motion.  Skin:    General: Skin is warm and dry.     Capillary Refill: Capillary refill takes less than 2 seconds.  Neurological:     Mental Status: She is alert and oriented to person, place, and time.  Psychiatric:        Mood and Affect: Mood and affect normal.        Behavior: Behavior  normal.      Musculoskeletal Exam: C-spine, thoracic spine, and lumbar spine good ROM.  Shoulder joints, elbow joints, wrist joints have good ROM with no discomfort. dactylitis of the right 2nd and 3rd digits. Incomplete right fist formation. Hip joints good ROM with no discomfort.  Knee joints good ROM with no warmth or effusion.  Pedal edema bilaterally.   CDAI Exam: CDAI Score: -- Patient Global: --; Provider Global: -- Swollen: 4 ; Tender: 4  Joint Exam 04/19/2020      Right  Left  MCP 2  Swollen Tender     MCP 3  Swollen Tender     PIP 2  Swollen Tender     PIP 3  Swollen Tender        Investigation: No additional findings.  Imaging: No results found.  Recent Labs: Lab Results  Component Value Date   WBC 5.6 02/13/2020   HGB 13.1 02/13/2020   PLT 378 02/13/2020   NA 140 02/13/2020   K 4.6 02/13/2020   CL 103 02/13/2020   CO2 28 02/13/2020   GLUCOSE 123 02/13/2020   BUN 16 02/13/2020   CREATININE 0.67 02/13/2020   BILITOT 0.4 02/13/2020   ALKPHOS 75 10/16/2016   AST 15 02/13/2020   ALT 14 02/13/2020   PROT 6.6 02/13/2020   ALBUMIN 4.2 10/16/2016   CALCIUM 9.2 02/13/2020   GFRAA 115 02/13/2020   QFTBGOLDPLUS NEGATIVE 04/06/2019    Speciality Comments: TB Gold: 10/16/16 Neg Failed Cosentyx-developed rash that sent her to the ER Does not want to try TNF-inhibitor due to neurologic risk since she has epilepsy  Procedures:  No procedures performed Allergies: Cosentyx [secukinumab], Hydrocodone, Vimpat [lacosamide], Amoxicillin, Codeine, Keppra [levetiracetam], Ketorolac tromethamine, Sulfa antibiotics, and Transderm-scop [scopolamine]   Assessment / Plan:     Visit Diagnoses: Psoriatic arthritis ([place]): She presents today with tenderness and dactylitis of the right second and third digits.  She recently had a severe flare of psoriatic  arthritis in multiple joints. She was diagnosed with a COVID-19 infection at the beginning of January 2022 and had to hold  Simponi and Otrexup for about 3 weeks until her symptoms had completely resolved.  She resumed both medications 1 week ago and most of her joint pain and inflammation has improved.  She was having difficulty walking and performing ADLs while off of simponi and otrexup.  She is still unable to make a complete fist.  A prednisone taper starting at 20 mg tapering by 5 mg every 2 days was sent to the pharmacy.  She was advised to notify us if her right hand pain persists or worsens.  She will continue on Simponi and Otrexup as prescribed.  She will follow up in 5 months or sooner if necessary.   Permanent handicap placard was provided to the patient.   High risk medication use - Simponi 50 mg sq every 28 days, Otrexup 20 mg every 7 days, and folic acid 1 mg 2 tablets daily. D/c cosentyx-rash. CBC and CMP were updated on 03/25/2020.  Her lab work was repeated today to recheck white blood cell count and renal function.  CBC and CMP were released.  Her next lab work will be due in May and every 3 months to monitor for drug toxicity.  Standing orders for CBC and CMP are in place.  TB gold negative on 04/06/19.  She is due to update TB gold today.  - Plan: CBC with Differential/Platelet, COMPLETE METABOLIC PANEL WITH GFR, QuantiFERON-TB Gold Plus She has not received any COVID-19 vaccinations.  She was diagnosed with a COVID-19 infection at the beginning of January.  According to the patient her symptoms are mild and she not receive the monoclonal antibody infusion.  She did hold simponi and oxtreup for about 3 weeks until her symptoms had completely resolved.  She restarted both medications 1 week ago.  Discussed the importance of holding Simponi and Otrexup anytime she has an infection and to resume once the infection has completely cleared.  She voiced understanding.  Screening for tuberculosis - Order for TB gold released. Plan: QuantiFERON-TB Gold Plus  Psoriasis -She is currently having a flare of psoriasis in  multiple locations including umbilical region and gluteal crease.  She requested a refill of clobetasol cream which was sent to the pharmacy.  Plan: clobetasol cream (TEMOVATE) 0.05 %  HLA B27 positive  Other medical conditions are listed as follows:   History of sleep apnea  History of asthma  History of abdominal hernia  History of cholecystectomy  History of hernia repair  History of epilepsy  Family history of rheumatoid arthritis   Orders: Orders Placed This Encounter  Procedures   CBC with Differential/Platelet   COMPLETE METABOLIC PANEL WITH GFR   QuantiFERON-TB Gold Plus   Meds ordered this encounter  Medications   clobetasol cream (TEMOVATE) 0.05 %    Sig: Apply 1 application topically 2 (two) times daily.    Dispense:  45 g    Refill:  0   predniSONE (DELTASONE) 5 MG tablet    Sig: Take 4 tablets by mouth daily x2 days, 3 tablets by mouth daily x2 days, 2 tablets by mouth daily x2 days, 1 tablet by mouth daily x2 days.    Dispense:  20 tablet    Refill:  0     Follow-Up Instructions: Return in about 5 months (around 09/16/2020) for Psoriatic arthritis.   [name], PA-C  Note - This record has been  created using [employer].  Chart creation errors have been sought, but may not always  have been located. Such creation errors do not reflect on  the standard of medical care.

## 2020-04-19 ENCOUNTER — Encounter: Payer: Self-pay | Admitting: Physician Assistant

## 2020-04-19 ENCOUNTER — Ambulatory Visit: Payer: BC Managed Care – PPO | Admitting: Physician Assistant

## 2020-04-19 ENCOUNTER — Other Ambulatory Visit: Payer: Self-pay

## 2020-04-19 VITALS — BP 120/83 | HR 70 | Resp 15 | Ht 62.0 in | Wt 226.4 lb

## 2020-04-19 DIAGNOSIS — L4050 Arthropathic psoriasis, unspecified: Secondary | ICD-10-CM | POA: Diagnosis not present

## 2020-04-19 DIAGNOSIS — Z79899 Other long term (current) drug therapy: Secondary | ICD-10-CM

## 2020-04-19 DIAGNOSIS — Z8719 Personal history of other diseases of the digestive system: Secondary | ICD-10-CM

## 2020-04-19 DIAGNOSIS — Z9889 Other specified postprocedural states: Secondary | ICD-10-CM

## 2020-04-19 DIAGNOSIS — L409 Psoriasis, unspecified: Secondary | ICD-10-CM

## 2020-04-19 DIAGNOSIS — Z9049 Acquired absence of other specified parts of digestive tract: Secondary | ICD-10-CM

## 2020-04-19 DIAGNOSIS — Z1589 Genetic susceptibility to other disease: Secondary | ICD-10-CM

## 2020-04-19 DIAGNOSIS — Z8709 Personal history of other diseases of the respiratory system: Secondary | ICD-10-CM

## 2020-04-19 DIAGNOSIS — Z111 Encounter for screening for respiratory tuberculosis: Secondary | ICD-10-CM

## 2020-04-19 DIAGNOSIS — Z8261 Family history of arthritis: Secondary | ICD-10-CM

## 2020-04-19 DIAGNOSIS — Z8669 Personal history of other diseases of the nervous system and sense organs: Secondary | ICD-10-CM

## 2020-04-19 MED ORDER — PREDNISONE 5 MG PO TABS: ORAL_TABLET | ORAL | 0 refills | Status: DC

## 2020-04-19 MED ORDER — CLOBETASOL PROPIONATE 0.05 % EX CREA: 1.0000 "application " | TOPICAL_CREAM | Freq: Two times a day (BID) | CUTANEOUS | 0 refills | Status: DC

## 2020-04-19 NOTE — Patient Instructions (Signed)
Standing Labs We placed an order today for your standing lab work.   Please have your standing labs drawn in May and every 3 months   If possible, please have your labs drawn 2 weeks prior to your appointment so that the provider can discuss your results at your appointment.  We have open lab daily Monday through Thursday from 8:30-12:30 PM and 1:30-4:30 PM and Friday from 8:30-12:30 PM and 1:30-4:00 PM at the office of Dr. Shaili Deveshwar, [facility].   Please be advised, all patients with office appointments requiring lab work will take precedents over walk-in lab work.  If possible, please come for your lab work on Monday and Friday afternoons, as you may experience shorter wait times. The office is located at [address], [address], [place], [place] [address] No appointment is necessary.   Labs are drawn by [facility]. Please bring your co-pay at the time of your lab draw.  You may receive a bill from [facility] for your lab work.  If you wish to have your labs drawn at another location, please call the office 24 hours in advance to send orders.  If you have any questions regarding directions or hours of operation,  please call [phone].   As a reminder, please drink plenty of water prior to coming for your lab work. Thanks!   

## 2020-04-19 NOTE — Progress Notes (Signed)
Medication Samples have been provided to the patient.  Drug name: Rasuvo  Strength: 20 mg Qty: 1 LOT: [id number] AB Exp.Date: April, 2022  Dosing instructions: Inject 20 mg into the skin once a week.

## 2020-04-22 LAB — CBC WITH DIFFERENTIAL/PLATELET
Absolute Monocytes: 538 cells/uL (ref 200–950)
Basophils Absolute: 62 cells/uL (ref 0–200)
Basophils Relative: 0.9 %
Eosinophils Absolute: 69 cells/uL (ref 15–500)
Eosinophils Relative: 1 %
HCT: 38.7 % (ref 35.0–45.0)
Hemoglobin: 12.8 g/dL (ref 11.7–15.5)
Lymphs Abs: 2519 cells/uL (ref 850–3900)
MCH: 29.5 pg (ref 27.0–33.0)
MCHC: 33.1 g/dL (ref 32.0–36.0)
MCV: 89.2 fL (ref 80.0–100.0)
MPV: 9.4 fL (ref 7.5–12.5)
Monocytes Relative: 7.8 %
Neutro Abs: 3712 cells/uL (ref 1500–7800)
Neutrophils Relative %: 53.8 %
Platelets: 304 10*3/uL (ref 140–400)
RBC: 4.34 10*6/uL (ref 3.80–5.10)
RDW: 15.6 % — ABNORMAL HIGH (ref 11.0–15.0)
Total Lymphocyte: 36.5 %
WBC: 6.9 10*3/uL (ref 3.8–10.8)

## 2020-04-22 LAB — COMPLETE METABOLIC PANEL WITH GFR
AG Ratio: 1.4 (calc) (ref 1.0–2.5)
ALT: 12 U/L (ref 6–29)
AST: 16 U/L (ref 10–35)
Albumin: 3.9 g/dL (ref 3.6–5.1)
Alkaline phosphatase (APISO): 67 U/L (ref 37–153)
BUN: 19 mg/dL (ref 7–25)
CO2: 28 mmol/L (ref 20–32)
Calcium: 9.1 mg/dL (ref 8.6–10.4)
Chloride: 105 mmol/L (ref 98–110)
Creat: 0.59 mg/dL (ref 0.50–1.05)
GFR, Est African American: 120 mL/min/{1.73_m2} (ref >=60)
GFR, Est Non African American: 104 mL/min/{1.73_m2} (ref >=60)
Globulin: 2.7 g/dL (calc) (ref 1.9–3.7)
Glucose, Bld: 93 mg/dL (ref 65–99)
Potassium: 4.4 mmol/L (ref 3.5–5.3)
Sodium: 141 mmol/L (ref 135–146)
Total Bilirubin: 0.3 mg/dL (ref 0.2–1.2)
Total Protein: 6.6 g/dL (ref 6.1–8.1)

## 2020-04-22 LAB — QUANTIFERON-TB GOLD PLUS
Mitogen-NIL: >10 IU/mL
NIL: 0.05 IU/mL
QuantiFERON-TB Gold Plus: NEGATIVE
TB1-NIL: <0 IU/mL
TB2-NIL: <0 IU/mL

## 2020-04-22 NOTE — Progress Notes (Signed)
CBC and CMP WNL.  TB gold negative.

## 2020-04-30 ENCOUNTER — Other Ambulatory Visit: Payer: Self-pay | Admitting: Physician Assistant

## 2020-04-30 DIAGNOSIS — L4050 Arthropathic psoriasis, unspecified: Secondary | ICD-10-CM

## 2020-04-30 NOTE — Telephone Encounter (Signed)
Last Visit: 04/19/2020 Next Visit: 09/18/2020 Labs: 04/19/2020, CBC and CMP WNL. TB gold negative.  TB Gold: 04/19/2020  Current Dose per office note 04/19/2020, Simponi 50 mg sq every 28 days  DX: Psoriatic arthritis   Last Fill: 02/22/2020  Okay to refill Simponi?

## 2020-07-05 ENCOUNTER — Other Ambulatory Visit: Payer: Self-pay | Admitting: *Deleted

## 2020-07-05 DIAGNOSIS — L4050 Arthropathic psoriasis, unspecified: Secondary | ICD-10-CM

## 2020-07-05 MED ORDER — OTREXUP 20 MG/0.4ML ~~LOC~~ SOAJ: 20.0000 mg | SUBCUTANEOUS | 0 refills | Status: DC

## 2020-07-05 NOTE — Telephone Encounter (Signed)
RX faxed from [place]  Next Visit: 09/18/2020  Last Visit: 04/19/2020  Last Fill: 02/22/2020  DX:  Psoriatic arthritis   Current Dose per office note 04/19/2020, Otrexup 20 mg every 7 days  Labs: 04/19/2020, CBC and CMP WNL. TB gold negative.   Okay to refill Otrexup?

## 2020-07-10 ENCOUNTER — Ambulatory Visit: Payer: BC Managed Care – PPO | Admitting: Neurology

## 2020-07-10 ENCOUNTER — Encounter: Payer: Self-pay | Admitting: Neurology

## 2020-07-10 VITALS — BP 142/92 | HR 76 | Ht 62.0 in | Wt 232.6 lb

## 2020-07-10 DIAGNOSIS — G43009 Migraine without aura, not intractable, without status migrainosus: Secondary | ICD-10-CM

## 2020-07-10 DIAGNOSIS — G40209 Localization-related (focal) (partial) symptomatic epilepsy and epileptic syndromes with complex partial seizures, not intractable, without status epilepticus: Secondary | ICD-10-CM

## 2020-07-10 MED ORDER — TROKENDI XR 50 MG PO CP24: 3.0000 | ORAL_CAPSULE | Freq: Every day | ORAL | 3 refills | Status: DC

## 2020-07-10 NOTE — Progress Notes (Signed)
Reason for visit: Seizures, headache  Tiffany Wilkins is an 55 y.o. female  History of present illness:  Tiffany Wilkins is a 56 year old right-handed white female with a history of well-controlled seizures.  She also has occasional headaches, they occur 2-4 times a month, the patient will take ibuprofen for the headaches with good improvement.  She has psoriatic arthritis and is followed through rheumatology.  She has done quite well over the last year, she remains on Trokendi taking 150 mg in the evening.  She tolerates the medication well.  She does operate a Teacher, music, she is actively working.  Past Medical History:  Diagnosis Date  . Allergic rhinitis, cause unspecified   . Anal fissure   . Arthritis    inflammatory and psoriatic--Dr. Estanislado Pandy  . Asthma    related to allergies and colds  . Chronic bronchitis    gets bronchitis yearly with colds  . Chronic kidney disease kidney stones  . Fracture    stress fracture right foot  . GERD (gastroesophageal reflux disease)   . Hernia (acquired) (recurrent)    R lower abdomen; has seen [facility] (Dr. Zena Amos and recurred  . Hiatal hernia 03/2011  . Impaired fasting glucose   . Internal hemorrhoids 03/2011  . Menstrual migraine    Dr, Jannifer Franklin  . Obesity   . Reflux esophagitis 03/2011  . Seizure disorder ([place])    f/b Dr. Jannifer Franklin  . Seizures ([place]) last seisure june 2012  . Sleep apnea    inconclusive test per pt (some central and obstructive component)  . Sleep apnea 06/07/09   mild complex sleep apnea, worse in supine position (uses CPAP intermittently only)  . Tinea cruris 7/09    Past Surgical History:  Procedure Laterality Date  . BARIATRIC SURGERY  09/22/2017  . CESAREAN SECTION     x2  . CHOLECYSTECTOMY  2005  . COLONOSCOPY  03/19/11   Dr. Olevia Perches; internal hemorrhoids  . ESOPHAGOGASTRODUODENOSCOPY  03/19/11   hiatal hernia, esophagitis  . KIDNEY STONE SURGERY  08/2010   retrieved with basket (at [facility])  . repair of  incisional hernia  2005, 2008   related to laparoscopic cholecystectomy    Family History  Problem Relation Age of Onset  . Arthritis Mother        rheumatoid  . Hyperlipidemia Mother   . Hypertension Mother   . Hyperthyroidism Mother        s/p thyroidectomy, now with hypothyroidism  . Cancer Father 57       AML, lung cancer, kidney cancer (all presented at same time)  . Diabetes Father   . Hypertension Father   . Hyperlipidemia Father   . Psoriasis Father   . Anxiety disorder Father        OCD, nervous breakdown  . Psoriasis Sister   . Irritable bowel syndrome Sister   . Arthritis Sister        psoriatic  . Diabetes Sister   . GER disease Daughter   . Asthma Son   . Asthma Maternal Grandmother   . Hyperlipidemia Maternal Grandmother   . Heart disease Maternal Grandmother   . Stroke Maternal Grandmother   . Diabetes Maternal Grandfather   . Heart disease Maternal Grandfather   . Cancer Paternal Grandmother 40       female (?ovarian)  . Diabetes Paternal Grandmother   . Hypertension Paternal Grandmother   . Diabetes Paternal Grandfather   . Heart disease Paternal Grandfather   . Hypertension  Paternal Grandfather   . Diabetes Paternal Uncle   . Crohn's disease Paternal Uncle   . Cancer Paternal Uncle        ? type  . Colitis Paternal Uncle   . Stroke Maternal Aunt   . Stroke Cousin   . Esophageal cancer Neg Hx   . Stomach cancer Neg Hx     Social history:  reports that she has never smoked. She has never used smokeless tobacco. She reports previous alcohol use. She reports that she does not use drugs.    Allergies  Allergen Reactions  . Cosentyx [Secukinumab] Anaphylaxis  . Hydrocodone Itching and Palpitations  . Vimpat [Lacosamide] Itching    Itching  . Amoxicillin Hives  . Codeine Itching and Other (See Comments)    Heart races.  . Keppra [Levetiracetam]     Suicide ideation  . Sulfa Antibiotics Nausea Only  . Transderm-Scop [Scopolamine] Other (See  Comments)    Stopped breathing.    Medications:  Prior to Admission medications   Medication Sig Start Date End Date Taking? Authorizing Provider  acetaminophen (TYLENOL) 500 MG tablet as needed.  09/24/17  Yes [provider]  albuterol (PROVENTIL HFA;VENTOLIN HFA) 108 (90 BASE) MCG/ACT inhaler Inhale 2 puffs into the lungs as needed.   Yes [provider]  clobetasol cream (TEMOVATE) 2.77 % Apply 1 application topically 2 (two) times daily. 04/19/20  Yes [name], PA-C  Cyanocobalamin (VITAMIN B-12 PO) Take by mouth daily.   Yes [provider]  diclofenac sodium (VOLTAREN) 1 % GEL Apply 3 g to 3 large joints up to 3 times daily. 02/16/18  Yes Deveshwar, Abel Presto, MD  fluconazole (DIFLUCAN) 150 MG tablet Take 1 tablet (150 mg) by mouth every 72 hours for recurrent yeast infection. 05/16/19  Yes [name], PA-C  folic acid (FOLVITE) 1 MG tablet TAKE 2 TABLETS(2MG  TOTAL) BY MOUTH DAILY 09/28/18  Yes Deveshwar, Abel Presto, MD  ibuprofen (ADVIL,MOTRIN) 200 MG tablet Take 400 mg by mouth every 6 (six) hours as needed.   Yes [provider]  Methotrexate, PF, (OTREXUP) 20 MG/0.4ML SOAJ Inject 20 mg into the skin once a week. 07/05/20  Yes [name], PA-C  Multiple Minerals-Vitamins (CALCIUM-MAGNESIUM-ZINC-D3) TABS Take 2 tablets by mouth daily.   Yes [provider]  pantoprazole (PROTONIX) 40 MG tablet Take 40 mg by mouth daily. 09/24/17  Yes [provider]  SENNA PO Take by mouth as needed.   Yes [provider]  SIMPONI 50 MG/0.5ML SOAJ INJECT THE CONTENTS OF 1 PEN (50 MG) UNDER THE SKIN EVERY 28 DAYS 04/30/20  Yes Deveshwar, Abel Presto, MD  Topiramate ER (TROKENDI XR) 50 MG CP24 Take 3 capsules by mouth at bedtime. 07/10/19  Yes Kathrynn Ducking, MD  VITAMIN D PO Take by mouth daily.   Yes [provider]  predniSONE (DELTASONE) 5 MG tablet Take 4 tablets by mouth daily x2 days, 3 tablets by mouth daily x2 days, 2 tablets by  mouth daily x2 days, 1 tablet by mouth daily x2 days. 04/19/20   [name], PA-C    ROS:  Out of a complete 14 system review of symptoms, the patient complains only of the following symptoms, and all other reviewed systems are negative.  History of seizures Joint pains Weight gain  Blood pressure (!) 142/92, pulse 76, height 5\' 2"  (1.575 m), weight 232 lb 9.6 oz (105.5 kg), last menstrual period 06/12/2014.  Physical Exam  General: The patient is alert  and cooperative at the time of the examination.  The patient is moderately obese.  Skin: No significant peripheral edema is noted.   Neurologic Exam  Mental status: The patient is alert and oriented x 3 at the time of the examination. The patient has apparent normal recent and remote memory, with an apparently normal attention span and concentration ability.   Cranial nerves: Facial symmetry is present. Speech is normal, no aphasia or dysarthria is noted. Extraocular movements are full. Visual fields are full.  Motor: The patient has good strength in all 4 extremities.  Sensory examination: Soft touch sensation is symmetric on the face, arms, and legs.  Coordination: The patient has good finger-nose-finger and heel-to-shin bilaterally.  Gait and station: The patient has a normal gait. Tandem gait is slightly unsteady. Romberg is negative. No drift is seen.  Reflexes: Deep tendon reflexes are symmetric.   Assessment/Plan:  1.  History of seizures, well controlled  2.  Migraine headache  The patient is doing well overall, we will continue the Trokendi and follow-up with the patient in 1 year.  She has had recent blood work done.  A prescription for the Trokendi was sent in.  Jill Alexanders MD 07/10/2020 9:05 AM  [facility] [address] [place], [place] [address]  Phone [phone] Fax [phone]

## 2020-07-23 ENCOUNTER — Other Ambulatory Visit: Payer: Self-pay | Admitting: Rheumatology

## 2020-07-23 DIAGNOSIS — L4050 Arthropathic psoriasis, unspecified: Secondary | ICD-10-CM

## 2020-07-23 NOTE — Telephone Encounter (Incomplete)
Next Visit: 09/18/2020  Last Visit: 04/19/2020  Last Fill: 04/30/2020  DX:  Psoriatic arthritis   Current Dose per office note 04/19/2020, Simponi 50 mg sq every 28 days  Labs: 04/19/2020, CBC and CMP WNL.  TB Gold: 04/19/2020, negative  Okay to refill Simponi?

## 2020-09-04 NOTE — Progress Notes (Signed)
Office Visit Note  Patient: Tiffany Wilkins             Date of Birth: [birth year]           MRN: [id number]             PCP: Kathe Becton, DO Referring: Kathe Becton, DO Visit Date: 09/18/2020 Occupation: _0 @  Subjective:  Other (Bilateral hand pain and swelling )   History of Present Illness: Tiffany Wilkins is a 55 y.o. female with a history of psoriatic arthritis and psoriasis.  She states that she continues to have some stiffness and discomfort in her hands especially the right index and middle finger.  None of the other joints are painful.  She has some psoriasis rash on the umbilical region and in the gluteal cleft.  She requested a refill of clobetasol which she uses occasionally.  She has been taking Simponi injections and Otrexup injections on a regular basis.  Activities of Daily Living:  Patient reports morning stiffness for 30 minutes.   Patient Reports nocturnal pain.  Difficulty dressing/grooming: Denies Difficulty climbing stairs: Reports Difficulty getting out of chair: Reports Difficulty using hands for taps, buttons, cutlery, and/or writing: Reports  Review of Systems  Constitutional:  Positive for fatigue.  HENT:  Negative for mouth sores, mouth dryness and nose dryness.   Eyes:  Negative for pain, itching and dryness.  Respiratory:  Negative for shortness of breath and difficulty breathing.   Cardiovascular:  Negative for chest pain and palpitations.  Gastrointestinal:  Positive for blood in stool and constipation. Negative for diarrhea.  Endocrine: Negative for increased urination.  Genitourinary:  Negative for difficulty urinating.  Musculoskeletal:  Positive for joint pain, joint pain, joint swelling and morning stiffness. Negative for myalgias, muscle tenderness and myalgias.  Skin:  Negative for color change, rash and redness.  Allergic/Immunologic: Negative for susceptible to infections.  Neurological:  Negative for dizziness, numbness,  headaches, memory loss and weakness.  Hematological:  Positive for bruising/bleeding tendency.  Psychiatric/Behavioral:  Negative for confusion.    PMFS History:  Patient Active Problem List   Diagnosis Date Noted   Primary osteoarthritis of both knees 12/11/2016   High risk medication use 06/30/2016   Right hand pain 06/30/2016   Right hip pain 06/30/2016   Hepatic steatosis 06/27/2014   BMI 50.0-59.9, adult ([place]) 06/27/2014   Fibroid, uterine 06/27/2014   Abdominal pain, chronic, right lower quadrant 06/27/2014   Obesity hypoventilation syndrome ([place]) 12/08/2013   Nocturnal seizures ([place]) 12/08/2013   OSA (obstructive sleep apnea) 12/08/2013   Noncompliance with CPAP treatment 12/08/2013   Psoriasis 11/29/2013   Seizure disorder, complex partial ([place]) 06/01/2012   Migraine 06/01/2012   OSA on CPAP 01/04/2012   Hiatal hernia 03/26/2011   Internal hemorrhoid 03/24/2011   Reflux esophagitis 03/24/2011   Psoriatic arthritis ([place]) 01/22/2011   Hernia of abdominal wall 01/22/2011    Past Medical History:  Diagnosis Date   Allergic rhinitis, cause unspecified    Anal fissure    Arthritis    inflammatory and psoriatic--Dr. Estanislado Pandy   Asthma    related to allergies and colds   Chronic bronchitis    gets bronchitis yearly with colds   Chronic kidney disease kidney stones   Fracture    stress fracture right foot   GERD (gastroesophageal reflux disease)    Hernia (acquired) (recurrent)    R lower abdomen; has seen [facility] (Dr. Zena Amos and recurred   Hiatal hernia 03/2011  Impaired fasting glucose    Internal hemorrhoids 03/2011   Menstrual migraine    Dr, Jannifer Franklin   Obesity    Reflux esophagitis 03/2011   Seizure disorder ([place])    f/b Dr. Jannifer Franklin   Seizures [facility]) last seisure june 2012   Sleep apnea    inconclusive test per pt (some central and obstructive component)   Sleep apnea 06/07/09   mild complex sleep apnea, worse in supine position (uses CPAP intermittently  only)   Tinea cruris 7/09    Family History  Problem Relation Age of Onset   Arthritis Mother        rheumatoid   Hyperlipidemia Mother    Hypertension Mother    Hyperthyroidism Mother        s/p thyroidectomy, now with hypothyroidism   Cancer Father 49       AML, lung cancer, kidney cancer (all presented at same time)   Diabetes Father    Hypertension Father    Hyperlipidemia Father    Psoriasis Father    Anxiety disorder Father        OCD, nervous breakdown   Psoriasis Sister    Irritable bowel syndrome Sister    Arthritis Sister        psoriatic   Diabetes Sister    GER disease Daughter    Asthma Son    Asthma Maternal Grandmother    Hyperlipidemia Maternal Grandmother    Heart disease Maternal Grandmother    Stroke Maternal Grandmother    Diabetes Maternal Grandfather    Heart disease Maternal Grandfather    Cancer Paternal Grandmother 63       female (?ovarian)   Diabetes Paternal Grandmother    Hypertension Paternal Grandmother    Diabetes Paternal Grandfather    Heart disease Paternal Grandfather    Hypertension Paternal Grandfather    Diabetes Paternal Uncle    Crohn's disease Paternal Uncle    Cancer Paternal Uncle        ? type   Colitis Paternal Uncle    Stroke Maternal Aunt    Stroke Cousin    Esophageal cancer Neg Hx    Stomach cancer Neg Hx    Past Surgical History:  Procedure Laterality Date   BARIATRIC SURGERY  09/22/2017   CESAREAN SECTION     x2   CHOLECYSTECTOMY  2005   COLONOSCOPY  03/19/11   Dr. Olevia Perches; internal hemorrhoids   ESOPHAGOGASTRODUODENOSCOPY  03/19/11   hiatal hernia, esophagitis   KIDNEY STONE SURGERY  08/2010   retrieved with basket (at [facility])   repair of incisional hernia  2005, 2008   related to laparoscopic cholecystectomy   Social History   Social History Narrative   Patient Lives with husband [name])  and 2 children (son and daughter, 24, 74)   Patient is right handed.   Patient drinks 2 cups daily- coffee/soda             Immunization History  Administered Date(s) Administered   Influenza Split 12/29/2010   Influenza,inj,Quad PF,6+ Mos 11/29/2013   Tdap 11/29/2013     Objective: Vital Signs: BP 124/86 (BP Location: Left Arm, Patient Position: Sitting, Cuff Size: Normal)   Pulse 67   Resp 16   Ht _0  (1.575 m)   Wt 239 lb 6.4 oz (108.6 kg)   LMP 06/12/2014   BMI 43.79 kg/m    Physical Exam Vitals and nursing note reviewed.  Constitutional:      Appearance: She is well-developed.  HENT:     Head: Normocephalic and atraumatic.  Eyes:     Conjunctiva/sclera: Conjunctivae normal.  Cardiovascular:     Rate and Rhythm: Normal rate and regular rhythm.     Heart sounds: Normal heart sounds.  Pulmonary:     Effort: Pulmonary effort is normal.     Breath sounds: Normal breath sounds.  Abdominal:     General: Bowel sounds are normal.     Palpations: Abdomen is soft.  Musculoskeletal:     Cervical back: Normal range of motion.  Lymphadenopathy:     Cervical: No cervical adenopathy.  Skin:    General: Skin is warm and dry.     Capillary Refill: Capillary refill takes less than 2 seconds.  Neurological:     Mental Status: She is alert and oriented to person, place, and time.  Psychiatric:        Behavior: Behavior normal.     Musculoskeletal Exam: C-spine was in good range of motion.  She some thoracic kyphosis.  Shoulder joints, elbow joints, wrist joints with good range of motion.  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  She had synovitis in her right second and third PIP joints.  Hip joints and knee joints were in good range of motion.  She had no tenderness over ankles or MTPs.  CDAI Exam: CDAI Score: 4.8  Patient Global: 4 mm; Provider Global: 4 mm Swollen: 2 ; Tender: 2  Joint Exam 09/18/2020      Right  Left  PIP 2  Swollen Tender     PIP 3  Swollen Tender        Investigation: No additional  findings.  Imaging: No results found.  Recent Labs: Lab Results  Component Value Date   WBC 6.9 04/19/2020   HGB 12.8 04/19/2020   PLT 304 04/19/2020   NA 141 04/19/2020   K 4.4 04/19/2020   CL 105 04/19/2020   CO2 28 04/19/2020   GLUCOSE 93 04/19/2020   BUN 19 04/19/2020   CREATININE 0.59 04/19/2020   BILITOT 0.3 04/19/2020   ALKPHOS 75 10/16/2016   AST 16 04/19/2020   ALT 12 04/19/2020   PROT 6.6 04/19/2020   ALBUMIN 4.2 10/16/2016   CALCIUM 9.1 04/19/2020   GFRAA 120 04/19/2020   QFTBGOLDPLUS NEGATIVE 04/19/2020    Speciality Comments: TB Gold: 10/16/16 Neg Failed Cosentyx-developed rash that sent her to the ER Does not want to try TNF-inhibitor due to neurologic risk since she has epilepsy  Procedures:  No procedures performed Allergies: Cosentyx [secukinumab], Hydrocodone, Vimpat [lacosamide], Amoxicillin, Codeine, Keppra [levetiracetam], Sulfa antibiotics, and Transderm-scop [scopolamine]   Assessment / Plan:     Visit Diagnoses: Psoriatic arthritis (HCC)-her psoriatic arthritis is not very well controlled.  She still has some synovitis in her right second and third PIP joints.  I discussed different treatment options but she declined.  She wants to continue on the current treatment regimen.  I offered cortisone injection to the PIP joints which she declined.  High risk medication use - Simponi 50 mg sq every 28 days, Otrexup 20 mg every 7 days, and folic acid 1 mg 2 tablets daily. D/c cosentyx-rash - Plan: CBC with Differential/Platelet, COMPLETE METABOLIC PANEL WITH GFR today and then every 3 months.  Getting lab follow-up on every 3 months basis was emphasized.  She does not want to get COVID-19 vaccine.  She states she has had COVID-19 infection x2.  Recommendations  regarding other vaccinations were placed in the AVS.  She was advised to stop the medications in case she develops an infection and resume medications once infection resolves.  Increased risk of nonmelanoma  skin cancer with anti-TNF's was discussed.  Yearly skin examination was advised.  Psoriasis -she has intermittent flare of psoriasis.  Per her request prescription refill for clobetasol was given.  Plan: clobetasol cream (TEMOVATE) 0.05 %  HLA B27 positive  BMI 43.79-increased risk of disease with psoriatic arthritis was discussed.  Instructions regarding prevention of heart disease were placed in the AVS.  History of sleep apnea  History of asthma  History of hernia repair  History of cholecystectomy  History of epilepsy  History of abdominal hernia  Family history of rheumatoid arthritis  Orders: Orders Placed This Encounter  Procedures   CBC with Differential/Platelet   COMPLETE METABOLIC PANEL WITH GFR    Meds ordered this encounter  Medications   folic acid (FOLVITE) 1 MG tablet    Sig: TAKE 2 TABLETS(2MG TOTAL) BY MOUTH DAILY    Dispense:  180 tablet    Refill:  3   clobetasol cream (TEMOVATE) 0.05 %    Sig: Apply 1 application topically 2 (two) times daily.    Dispense:  45 g    Refill:  0     Follow-Up Instructions: Return in about 5 months (around 02/18/2021) for Psoriatic arthritis.   Bo Merino, MD  Note - This record has been created using Editor, commissioning.  Chart creation errors have been sought, but may not always  have been located. Such creation errors do not reflect on  the standard of medical care.

## 2020-09-18 ENCOUNTER — Encounter: Payer: Self-pay | Admitting: Rheumatology

## 2020-09-18 ENCOUNTER — Other Ambulatory Visit: Payer: Self-pay

## 2020-09-18 ENCOUNTER — Ambulatory Visit: Payer: BC Managed Care – PPO | Admitting: Rheumatology

## 2020-09-18 VITALS — BP 124/86 | HR 67 | Resp 16 | Ht 62.0 in | Wt 239.4 lb

## 2020-09-18 DIAGNOSIS — Z9889 Other specified postprocedural states: Secondary | ICD-10-CM

## 2020-09-18 DIAGNOSIS — Z9049 Acquired absence of other specified parts of digestive tract: Secondary | ICD-10-CM

## 2020-09-18 DIAGNOSIS — L409 Psoriasis, unspecified: Secondary | ICD-10-CM

## 2020-09-18 DIAGNOSIS — L4050 Arthropathic psoriasis, unspecified: Secondary | ICD-10-CM | POA: Diagnosis not present

## 2020-09-18 DIAGNOSIS — Z79899 Other long term (current) drug therapy: Secondary | ICD-10-CM

## 2020-09-18 DIAGNOSIS — Z8719 Personal history of other diseases of the digestive system: Secondary | ICD-10-CM

## 2020-09-18 DIAGNOSIS — Z1589 Genetic susceptibility to other disease: Secondary | ICD-10-CM | POA: Diagnosis not present

## 2020-09-18 DIAGNOSIS — Z8261 Family history of arthritis: Secondary | ICD-10-CM

## 2020-09-18 DIAGNOSIS — Z8709 Personal history of other diseases of the respiratory system: Secondary | ICD-10-CM

## 2020-09-18 DIAGNOSIS — Z8669 Personal history of other diseases of the nervous system and sense organs: Secondary | ICD-10-CM

## 2020-09-18 DIAGNOSIS — Z6841 Body Mass Index (BMI) 40.0 and over, adult: Secondary | ICD-10-CM

## 2020-09-18 MED ORDER — CLOBETASOL PROPIONATE 0.05 % EX CREA: 1.0000 "application " | TOPICAL_CREAM | Freq: Two times a day (BID) | CUTANEOUS | 0 refills | Status: DC

## 2020-09-18 MED ORDER — FOLIC ACID 1 MG PO TABS: ORAL_TABLET | ORAL | 3 refills | Status: DC

## 2020-09-18 NOTE — Patient Instructions (Addendum)
Please a schedule an yearly skin examination by a dermatologist to screen for nonmelanoma skin cancer while you are on Simponi injections.  Standing Labs We placed an order today for your standing lab work.   Please have your standing labs drawn in October and every 3 months  If possible, please have your labs drawn 2 weeks prior to your appointment so that the provider can discuss your results at your appointment.  Please note that you may see your imaging and lab results in [facility] before we have reviewed them. We may be awaiting multiple results to interpret others before contacting you. Please allow our office up to 72 hours to thoroughly review all of the results before contacting the office for clarification of your results.  We have open lab daily: Monday through Thursday from 1:30-4:30 PM and Friday from 1:30-4:00 PM at the office of Dr. Bo Merino, [facility].   Please be advised, all patients with office appointments requiring lab work will take precedent over walk-in lab work.  If possible, please come for your lab work on Monday and Friday afternoons, as you may experience shorter wait times. The office is located at [address] [place], [place], [place] [address] No appointment is necessary.   Labs are drawn by [facility]. Please bring your co-pay at the time of your lab draw.  You may receive a bill from [facility] for your lab work.  If you wish to have your labs drawn at another location, please call the office 24 hours in advance to send orders.  If you have any questions regarding directions or hours of operation,  please call [phone].   As a reminder, please drink plenty of water prior to coming for your lab work. Thanks!   Vaccines You are taking a medication(s) that can suppress your immune system.  The following immunizations are recommended: Flu annually Covid-19  Td/Tdap (tetanus, diphtheria, pertussis) every 10 years Pneumonia (Prevnar  15 then Pneumovax 23 at least 1 year apart.  Alternatively, can take Prevnar 20 without needing additional dose) Shingrix (after age 46): 2 doses from 4 weeks to 6 months apart  Please check with your PCP to make sure you are up to date.   If you test POSITIVE for COVID19 and have MILD to MODERATE symptoms: First, call your PCP if you would like to receive COVID19 treatment AND Hold your medications during the infection and for at least 1 week after your symptoms have resolved: Injectable medication (Benlysta, Cimzia, Cosentyx, Enbrel, Humira, Orencia, Remicade, Simponi, Stelara, Taltz, Tremfya) Methotrexate Leflunomide (Arava) Mycophenolate (Cellcept) Morrie Sheldon, Olumiant, or Rinvoq If you take Actemra or Kevzara, you DO NOT need to hold these for COVID19 infection.  If you test POSITIVE for COVID19 and have NO symptoms: First, call your PCP if you would like to receive COVID19 treatment AND Hold your medications for at least 10 days after the day that you tested positive Injectable medication (Benlysta, Cimzia, Cosentyx, Enbrel, Humira, Orencia, Remicade, Simponi, Stelara, Taltz, Tremfya) Methotrexate Leflunomide (Arava) Mycophenolate (Cellcept) Morrie Sheldon, Olumiant, or Rinvoq If you take Actemra or Kevzara, you DO NOT need to hold these for COVID19 infection.  If you have signs or symptoms of an infection or start antibiotics: First, call your PCP for workup of your infection. Hold your medication through the infection, until you complete your antibiotics, and until symptoms resolve if you take the following: Injectable medication (Actemra, Benlysta, Cimzia, Cosentyx, Enbrel, Humira, Kevzara, Orencia, Remicade, Simponi, Stelara, Taltz, Tremfya) Methotrexate Leflunomide (Pateros)  Mycophenolate (Cellcept) Roma Kayser, or Rinvoq  Heart Disease Prevention   Your inflammatory disease increases your risk of heart disease which includes heart attack, stroke, atrial fibrillation (irregular  heartbeats), high blood pressure, heart failure and atherosclerosis (plaque in the arteries).  It is important to reduce your risk by:   Keep blood pressure, cholesterol, and blood sugar at healthy levels   Smoking Cessation   Maintain a healthy weight  BMI 20-25   Eat a healthy diet  Plenty of fresh fruit, vegetables, and whole grains  Limit saturated fats, foods high in sodium, and added sugars  DASH and Mediterranean diet   Increase physical activity  Recommend moderate physically activity for 150 minutes per week/ 30 minutes a day for five days a week These can be broken up into three separate ten-minute sessions during the day.   Reduce Stress  Meditation, slow breathing exercises, yoga, coloring books  Dental visits twice a year

## 2020-09-19 LAB — CBC WITH DIFFERENTIAL/PLATELET
Absolute Monocytes: 521 cells/uL (ref 200–950)
Basophils Absolute: 37 cells/uL (ref 0–200)
Basophils Relative: 0.6 %
Eosinophils Absolute: 50 cells/uL (ref 15–500)
Eosinophils Relative: 0.8 %
HCT: 42 % (ref 35.0–45.0)
Hemoglobin: 13.6 g/dL (ref 11.7–15.5)
Lymphs Abs: 3075 cells/uL (ref 850–3900)
MCH: 29.1 pg (ref 27.0–33.0)
MCHC: 32.4 g/dL (ref 32.0–36.0)
MCV: 89.7 fL (ref 80.0–100.0)
MPV: 8.9 fL (ref 7.5–12.5)
Monocytes Relative: 8.4 %
Neutro Abs: 2517 cells/uL (ref 1500–7800)
Neutrophils Relative %: 40.6 %
Platelets: 321 10*3/uL (ref 140–400)
RBC: 4.68 10*6/uL (ref 3.80–5.10)
RDW: 14 % (ref 11.0–15.0)
Total Lymphocyte: 49.6 %
WBC: 6.2 10*3/uL (ref 3.8–10.8)

## 2020-09-19 LAB — COMPLETE METABOLIC PANEL WITH GFR
AG Ratio: 1.2 (calc) (ref 1.0–2.5)
ALT: 16 U/L (ref 6–29)
AST: 18 U/L (ref 10–35)
Albumin: 3.9 g/dL (ref 3.6–5.1)
Alkaline phosphatase (APISO): 76 U/L (ref 37–153)
BUN: 19 mg/dL (ref 7–25)
CO2: 32 mmol/L (ref 20–32)
Calcium: 9.3 mg/dL (ref 8.6–10.4)
Chloride: 103 mmol/L (ref 98–110)
Creat: 0.69 mg/dL (ref 0.50–1.05)
GFR, Est African American: 114 mL/min/{1.73_m2} (ref >=60)
GFR, Est Non African American: 99 mL/min/{1.73_m2} (ref >=60)
Globulin: 3.2 g/dL (calc) (ref 1.9–3.7)
Glucose, Bld: 79 mg/dL (ref 65–99)
Potassium: 5 mmol/L (ref 3.5–5.3)
Sodium: 140 mmol/L (ref 135–146)
Total Bilirubin: 0.4 mg/dL (ref 0.2–1.2)
Total Protein: 7.1 g/dL (ref 6.1–8.1)

## 2020-09-27 ENCOUNTER — Other Ambulatory Visit: Payer: Self-pay | Admitting: *Deleted

## 2020-09-27 DIAGNOSIS — L4050 Arthropathic psoriasis, unspecified: Secondary | ICD-10-CM

## 2020-09-27 MED ORDER — OTREXUP 20 MG/0.4ML ~~LOC~~ SOAJ: 20.0000 mg | SUBCUTANEOUS | 0 refills | Status: DC

## 2020-09-27 NOTE — Telephone Encounter (Signed)
Refill request received via fax  Next Visit: 02/18/2021  Last Visit: 09/18/2020  Last Fill: 07/05/2020  DX: Psoriatic arthritis  Current Dose per office note 09/18/2020: Otrexup 20 mg every 7 days  Labs: 09/18/2020 WNL  Per protocol, okay to refill per Dr. Estanislado Pandy

## 2020-10-22 ENCOUNTER — Other Ambulatory Visit: Payer: Self-pay | Admitting: Physician Assistant

## 2020-10-22 DIAGNOSIS — L4050 Arthropathic psoriasis, unspecified: Secondary | ICD-10-CM

## 2020-10-22 NOTE — Telephone Encounter (Signed)
Next Visit: 02/18/2021   Last Visit: 09/18/2020   Last Fill: 07/23/2020   DX: Psoriatic arthritis   Current Dose per office note 09/18/2020: Simponi 50 mg sq every 28 days  Labs: 09/18/2020 WNL  TB Gold: 04/19/2020 Neg   Okay to refill Simponi?

## 2020-11-19 DIAGNOSIS — R059 Cough, unspecified: Secondary | ICD-10-CM | POA: Diagnosis not present

## 2020-11-19 DIAGNOSIS — Z20822 Contact with and (suspected) exposure to covid-19: Secondary | ICD-10-CM | POA: Diagnosis not present

## 2020-11-19 DIAGNOSIS — J209 Acute bronchitis, unspecified: Secondary | ICD-10-CM | POA: Diagnosis not present

## 2020-11-19 DIAGNOSIS — J42 Unspecified chronic bronchitis: Secondary | ICD-10-CM | POA: Diagnosis not present

## 2020-12-03 DIAGNOSIS — J40 Bronchitis, not specified as acute or chronic: Secondary | ICD-10-CM | POA: Diagnosis not present

## 2020-12-19 DIAGNOSIS — L4050 Arthropathic psoriasis, unspecified: Secondary | ICD-10-CM | POA: Diagnosis not present

## 2020-12-19 DIAGNOSIS — K2900 Acute gastritis without bleeding: Secondary | ICD-10-CM | POA: Diagnosis not present

## 2020-12-19 DIAGNOSIS — Z9889 Other specified postprocedural states: Secondary | ICD-10-CM | POA: Diagnosis not present

## 2020-12-25 ENCOUNTER — Other Ambulatory Visit: Payer: Self-pay | Admitting: *Deleted

## 2020-12-25 DIAGNOSIS — L4050 Arthropathic psoriasis, unspecified: Secondary | ICD-10-CM

## 2020-12-25 MED ORDER — OTREXUP 20 MG/0.4ML ~~LOC~~ SOAJ: 20.0000 mg | SUBCUTANEOUS | 0 refills | Status: DC

## 2020-12-25 NOTE — Telephone Encounter (Signed)
Refill request received via fax.  Next Visit: 02/18/2021  Last Visit: 09/18/2020  Last Fill: 09/27/2020  DX: Psoriatic arthritis  Current Dose per office note 09/18/2020:  Otrexup 20 mg every 7 days  Labs: 09/18/2020 WNL  Left message to advise patient she is due to update labs.     Okay to refill Otrexup?

## 2021-01-15 ENCOUNTER — Telehealth: Payer: Self-pay

## 2021-01-15 NOTE — Telephone Encounter (Addendum)
PA automatically initiated via CMM under medical benefits Per previous note, pt's current PA is through pharmacy benefits and remains active until 04/04/2022 (Auth# [id number]). Received notification that pt's refill was hung up at pharmacy due to missing PA.   Submitted a Prior Authorization request to [facility] for [facility] via CoverMyMeds. Will update once we receive a response.   Key: [id number]

## 2021-01-17 ENCOUNTER — Other Ambulatory Visit: Payer: Self-pay | Admitting: *Deleted

## 2021-01-17 ENCOUNTER — Telehealth: Payer: Self-pay

## 2021-01-17 ENCOUNTER — Other Ambulatory Visit (HOSPITAL_COMMUNITY): Payer: Self-pay

## 2021-01-17 DIAGNOSIS — Z79899 Other long term (current) drug therapy: Secondary | ICD-10-CM | POA: Diagnosis not present

## 2021-01-17 NOTE — Telephone Encounter (Signed)
Left message to advise patient she may come by the office to pick up sample of Simponi.

## 2021-01-17 NOTE — Telephone Encounter (Signed)
Received a notification regarding Prior Authorization from [facility] for [facility]. Authorization has been CANCELED because patient has previously existing coverage of medication under authorization# [id number] from 04/06/2019 until 04/04/2022.

## 2021-01-17 NOTE — Telephone Encounter (Signed)
Patient called requesting to stop by the office this morning to pick up a sample of Simponi.  Patient states her insurance is requiring another PA before they will refill it.

## 2021-01-17 NOTE — Telephone Encounter (Signed)
[facility] to re-process patient's Simponi autoinjector rx. They are unable to clarify why there was a delay in processing as prior auth was already approved and in place through 04/04/22  Rep reached out to patient to schedule shipment of Simponi. Shipment scheduled to arrive to patient's home on 01/21/21  Knox Saliva, PharmD, MPH, BCPS Clinical Pharmacist (Rheumatology and Pulmonology)

## 2021-01-17 NOTE — Telephone Encounter (Signed)
Medication Samples have been provided to the patient.  Drug name: Simponi       Strength: 50 mg/0.5 mL        Qty: 1 LOT: [id number]  Exp.Date: 08/2022  Dosing instructions: Inject one pen into skin every 28 days.

## 2021-01-18 LAB — CBC WITH DIFFERENTIAL/PLATELET
Absolute Monocytes: 540 cells/uL (ref 200–950)
Basophils Absolute: 33 cells/uL (ref 0–200)
Basophils Relative: 0.5 %
Eosinophils Absolute: 33 cells/uL (ref 15–500)
Eosinophils Relative: 0.5 %
HCT: 37.7 % (ref 35.0–45.0)
Hemoglobin: 12.4 g/dL (ref 11.7–15.5)
Lymphs Abs: 2308 cells/uL (ref 850–3900)
MCH: 29.5 pg (ref 27.0–33.0)
MCHC: 32.9 g/dL (ref 32.0–36.0)
MCV: 89.8 fL (ref 80.0–100.0)
MPV: 8.7 fL (ref 7.5–12.5)
Monocytes Relative: 8.3 %
Neutro Abs: 3588 cells/uL (ref 1500–7800)
Neutrophils Relative %: 55.2 %
Platelets: 404 10*3/uL — ABNORMAL HIGH (ref 140–400)
RBC: 4.2 10*6/uL (ref 3.80–5.10)
RDW: 13.9 % (ref 11.0–15.0)
Total Lymphocyte: 35.5 %
WBC: 6.5 10*3/uL (ref 3.8–10.8)

## 2021-01-18 LAB — COMPLETE METABOLIC PANEL WITH GFR
AG Ratio: 1.3 (calc) (ref 1.0–2.5)
ALT: 15 U/L (ref 6–29)
AST: 25 U/L (ref 10–35)
Albumin: 3.8 g/dL (ref 3.6–5.1)
Alkaline phosphatase (APISO): 69 U/L (ref 37–153)
BUN: 21 mg/dL (ref 7–25)
CO2: 27 mmol/L (ref 20–32)
Calcium: 9.2 mg/dL (ref 8.6–10.4)
Chloride: 104 mmol/L (ref 98–110)
Creat: 0.57 mg/dL (ref 0.50–1.03)
Globulin: 2.9 g/dL (calc) (ref 1.9–3.7)
Glucose, Bld: 86 mg/dL (ref 65–99)
Potassium: 4.6 mmol/L (ref 3.5–5.3)
Sodium: 140 mmol/L (ref 135–146)
Total Bilirubin: 0.4 mg/dL (ref 0.2–1.2)
Total Protein: 6.7 g/dL (ref 6.1–8.1)
eGFR: 107 mL/min/{1.73_m2} (ref >=60)

## 2021-01-18 NOTE — Progress Notes (Signed)
Platelets are mildly elevated.  CMP is normal.  We will continue to monitor labs.

## 2021-01-22 ENCOUNTER — Encounter: Payer: Self-pay | Admitting: *Deleted

## 2021-01-28 ENCOUNTER — Other Ambulatory Visit: Payer: Self-pay | Admitting: Physician Assistant

## 2021-01-28 DIAGNOSIS — L4050 Arthropathic psoriasis, unspecified: Secondary | ICD-10-CM

## 2021-01-28 NOTE — Telephone Encounter (Signed)
Next Visit: 02/18/2021  Last Visit: 09/18/2020  Last Fill: 10/22/2020   DX: Psoriatic arthritis   Current Dose per office note 09/18/2020: Simponi 50 mg sq every 28 days   Labs: 01/17/2021 Platelets are mildly elevated.  CMP is normal.  We will continue to monitor labs.  TB Gold: 04/19/2020 Neg    Okay to refill Simponi?

## 2021-01-30 ENCOUNTER — Other Ambulatory Visit: Payer: Self-pay | Admitting: *Deleted

## 2021-01-30 DIAGNOSIS — L4050 Arthropathic psoriasis, unspecified: Secondary | ICD-10-CM

## 2021-01-30 MED ORDER — OTREXUP 20 MG/0.4ML ~~LOC~~ SOAJ
20.0000 mg | SUBCUTANEOUS | 0 refills | Status: DC
Start: 2021-01-30 — End: 2021-04-18

## 2021-01-30 NOTE — Telephone Encounter (Signed)
Refill request received via fax  Next Visit: 02/18/2021   Last Visit: 09/18/2020   Last Fill: 10/22/2020    DX: Psoriatic arthritis   Current Dose per office note 09/18/2020: Otrexup 20 mg every 7 days   Labs: 01/17/2021 Platelets are mildly elevated.  CMP is normal.  We will continue to monitor labs  Okay to refill Otrexup?

## 2021-02-04 NOTE — Progress Notes (Signed)
Office Visit Note  Patient: Tiffany Wilkins             Date of Birth: Oct 08, 1965           MRN: [id number]             PCP: Kathe Becton, DO Referring: Kathe Becton, DO Visit Date: 02/18/2021 Occupation: _0 @  Subjective:  Chronic pain   History of Present Illness: Tiffany Wilkins is a 55 y.o. female with history of psoriatic arthritis.  She is on simponi 50 mg sq injections every month and otrexup 20 mg sq injections once weekly.  She has not missed any doses recently.  She reports she continues to have pain in both hands, both knees, and both feet.  She does not feel her pain is being well managed taking tylenol and advil PRN.  She has intermittent inflammation in both hands and the right knee.  She is currently having pain in the left heel.  She states she has been more sedentary recently and has noticed increased weight gain.  She states she has fatigue on daily basis and has increased her carb intake to try to boost her energy.   She denies any recent infections.     Activities of Daily Living:  Patient reports morning stiffness for all day. Patient Denies nocturnal pain.  Difficulty dressing/grooming: Reports Difficulty climbing stairs: Reports Difficulty getting out of chair: Reports Difficulty using hands for taps, buttons, cutlery, and/or writing: Reports  Review of Systems  Constitutional:  Positive for fatigue.  HENT:  Negative for mouth sores, mouth dryness and nose dryness.   Eyes:  Positive for itching and dryness. Negative for pain.  Respiratory:  Negative for shortness of breath and difficulty breathing.   Cardiovascular:  Negative for chest pain and palpitations.  Gastrointestinal:  Positive for constipation. Negative for blood in stool and diarrhea.  Endocrine: Negative for increased urination.  Genitourinary:  Negative for difficulty urinating.  Musculoskeletal:  Positive for joint pain, joint pain, joint swelling, myalgias, morning stiffness, muscle  tenderness and myalgias.  Skin:  Positive for rash. Negative for color change.  Allergic/Immunologic: Negative for susceptible to infections.  Neurological:  Negative for dizziness, numbness, headaches, memory loss and weakness.  Hematological:  Positive for bruising/bleeding tendency.  Psychiatric/Behavioral:  Negative for confusion.    PMFS History:  Patient Active Problem List   Diagnosis Date Noted   Primary osteoarthritis of both knees 12/11/2016   High risk medication use 06/30/2016   Right hand pain 06/30/2016   Right hip pain 06/30/2016   Hepatic steatosis 06/27/2014   BMI 50.0-59.9, adult ([place]) 06/27/2014   Fibroid, uterine 06/27/2014   Abdominal pain, chronic, right lower quadrant 06/27/2014   Obesity hypoventilation syndrome ([place]) 12/08/2013   Nocturnal seizures ([place]) 12/08/2013   OSA (obstructive sleep apnea) 12/08/2013   Noncompliance with CPAP treatment 12/08/2013   Psoriasis 11/29/2013   Seizure disorder, complex partial ([place]) 06/01/2012   Migraine 06/01/2012   OSA on CPAP 01/04/2012   Hiatal hernia 03/26/2011   Internal hemorrhoid 03/24/2011   Reflux esophagitis 03/24/2011   Psoriatic arthritis ([place]) 01/22/2011   Hernia of abdominal wall 01/22/2011    Past Medical History:  Diagnosis Date   Allergic rhinitis, cause unspecified    Anal fissure    Arthritis    inflammatory and psoriatic--Dr. Estanislado Pandy   Asthma    related to allergies and colds   Chronic bronchitis    gets bronchitis yearly with colds   Chronic  kidney disease kidney stones   Fracture    stress fracture right foot   GERD (gastroesophageal reflux disease)    Hernia (acquired) (recurrent)    R lower abdomen; has seen [facility] (Dr. Zena Amos and recurred   Hiatal hernia 03/2011   Impaired fasting glucose    Internal hemorrhoids 03/2011   Menstrual migraine    Dr, Jannifer Franklin   Obesity    Reflux esophagitis 03/2011   Seizure disorder ([place])    f/b Dr. Jannifer Franklin   Seizures [facility]) last seisure june  2012   Sleep apnea    inconclusive test per pt (some central and obstructive component)   Sleep apnea 06/07/09   mild complex sleep apnea, worse in supine position (uses CPAP intermittently only)   Tinea cruris 7/09    Family History  Problem Relation Age of Onset   Arthritis Mother        rheumatoid   Hyperlipidemia Mother    Hypertension Mother    Hyperthyroidism Mother        s/p thyroidectomy, now with hypothyroidism   Cancer Father 42       AML, lung cancer, kidney cancer (all presented at same time)   Diabetes Father    Hypertension Father    Hyperlipidemia Father    Psoriasis Father    Anxiety disorder Father        OCD, nervous breakdown   Psoriasis Sister    Irritable bowel syndrome Sister    Arthritis Sister        psoriatic   Diabetes Sister    Stroke Maternal Aunt    Diabetes Paternal Uncle    Crohn's disease Paternal Uncle    Cancer Paternal Uncle        ? type   Colitis Paternal Uncle    Asthma Maternal Grandmother    Hyperlipidemia Maternal Grandmother    Heart disease Maternal Grandmother    Stroke Maternal Grandmother    Diabetes Maternal Grandfather    Heart disease Maternal Grandfather    Cancer Paternal Grandmother 57       female (?ovarian)   Diabetes Paternal Grandmother    Hypertension Paternal Grandmother    Diabetes Paternal Grandfather    Heart disease Paternal Grandfather    Hypertension Paternal Grandfather    GER disease Daughter    Asthma Son    Stroke Cousin    Turner syndrome Niece    Esophageal cancer Neg Hx    Stomach cancer Neg Hx    Past Surgical History:  Procedure Laterality Date   BARIATRIC SURGERY  09/22/2017   CESAREAN SECTION     x2   CHOLECYSTECTOMY  2005   COLONOSCOPY  03/19/11   Dr. Olevia Perches; internal hemorrhoids   ESOPHAGOGASTRODUODENOSCOPY  03/19/11   hiatal hernia, esophagitis   KIDNEY STONE SURGERY  08/2010   retrieved with basket (at [facility])   repair of incisional hernia  2005, 2008   related to  laparoscopic cholecystectomy   Social History   Social History Narrative   Patient Lives with husband [name])  and 2 children (son and daughter, 2, 26)   Patient is right handed.   Patient drinks 2 cups daily- coffee/soda            Immunization History  Administered Date(s) Administered   Influenza Split 12/29/2010   Influenza,inj,Quad PF,6+ Mos 11/29/2013   Tdap 11/29/2013     Objective: Vital Signs: BP (!) 144/85 (BP Location: Left Wrist, Patient Position: Sitting, Cuff Size: Normal)  Pulse 81   Ht 5' 2" (1.575 m)   Wt 250 lb 9.6 oz (113.7 kg)   LMP 06/12/2014   BMI 45.84 kg/m    Physical Exam Vitals and nursing note reviewed.  Constitutional:      Appearance: She is well-developed.  HENT:     Head: Normocephalic and atraumatic.  Eyes:     Conjunctiva/sclera: Conjunctivae normal.  Pulmonary:     Effort: Pulmonary effort is normal.  Abdominal:     Palpations: Abdomen is soft.  Musculoskeletal:     Cervical back: Normal range of motion.  Skin:    General: Skin is warm and dry.     Capillary Refill: Capillary refill takes less than 2 seconds.  Neurological:     Mental Status: She is alert and oriented to person, place, and time.  Psychiatric:        Behavior: Behavior normal.     Musculoskeletal Exam: C-spine has good range of motion with no discomfort.  Thoracic kyphosis noted.  Tenderness over the right SI joint. Shoulder joints have good range of motion with some discomfort bilaterally.  Elbow joints and wrist joints have good range of motion with no tenderness or synovitis.  Incomplete fist formation bilaterally due to ankylosis of some PIP and DIP joints.  Some tenderness and synovitis over the right second and third PIP joints.  Hip joints have slightly limited range of motion with discomfort bilaterally.  Painful range of motion of both knee joints with crepitus bilaterally.  Some warmth in the right knee.  Ankle joints have good range of motion with no  tenderness.  Tenderness along the plantar fascia of the left foot.  CDAI Exam: CDAI Score: 6.2  Patient Global: 7 mm; Provider Global: 5 mm Swollen: 2 ; Tender: 4  Joint Exam 02/18/2021      Right  Left  PIP 2  Swollen Tender     PIP 3  Swollen Tender     Sacroiliac   Tender     Knee   Tender        Investigation: No additional findings.  Imaging: No results found.  Recent Labs: Lab Results  Component Value Date   WBC 6.5 01/17/2021   HGB 12.4 01/17/2021   PLT 404 (H) 01/17/2021   NA 140 01/17/2021   K 4.6 01/17/2021   CL 104 01/17/2021   CO2 27 01/17/2021   GLUCOSE 86 01/17/2021   BUN 21 01/17/2021   CREATININE 0.57 01/17/2021   BILITOT 0.4 01/17/2021   ALKPHOS 75 10/16/2016   AST 25 01/17/2021   ALT 15 01/17/2021   PROT 6.7 01/17/2021   ALBUMIN 4.2 10/16/2016   CALCIUM 9.2 01/17/2021   GFRAA 114 09/18/2020   QFTBGOLDPLUS NEGATIVE 04/19/2020    Speciality Comments: TB Gold: 10/16/16 Neg Failed Cosentyx-developed rash that sent her to the ER Does not want to try TNF-inhibitor due to neurologic risk since she has epilepsy  Procedures:  No procedures performed Allergies: Cosentyx [secukinumab], Hydrocodone, Vimpat [lacosamide], Amoxicillin, Codeine, Keppra [levetiracetam], Sulfa antibiotics, and Transderm-scop [scopolamine]   Assessment / Plan:     Visit Diagnoses: Psoriatic arthritis ([place]) -She has ongoing tenderness and synovitis of the second and third PIP joints of the right hand.  She continues to experience intermittent pain and stiffness in both hands, both knees, and both feet.  Her pain is currently most severe in her right knee joint which was warm to palpation.  She is also having discomfort along the plantar fascia of  the left foot.  She is currently on Simponi 50 mg sq injections every 28 days and Otrexup 20 mg subcutaneous injections once weekly.  She has been taking Tylenol or ibuprofen as needed for symptomatic relief.  She has noticed some increased  breakthrough symptoms leading up to her monthly Simponi injection.  Discussed trying to switch to IV Simponi Aria or another medication entirely but she declined at this time.  A sed rate will be added with her upcoming lab work in February.  Advised the patient to notify us if she continues to have breakthrough symptoms leading up to her monthly Simponi injections.  She was encouraged to try to avoid excessive use of NSAIDs and Tylenol while on Otrexup especially with her history of gastric bypass.  Discussed a referral to pain management but she declined at this time.  I also discussed the importance of increasing her exercise regimen since she has had some increased weight gain while being more sedentary.   She is considering transferring care to another rheumatologist in the area to the practice where her mother goes, so she will notify us if she would like [place] to place a referral in the future.  Plan: Sedimentation rate  Psoriasis -Unchanged.  No new patches.  She has chronic patches in the gluteal cleft, ear canals, and umbilical region.  She uses clobetasol cream 0.05% topically as needed.    High risk medication use - Simponi 50 mg sq injections every 28 days, Otrexup 20 mg every 7 days, and folic acid 1 mg 2 tablets daily. D/c cosentyx-rash  CBC and CMP updated on 01/17/21.  She is due to update lab work in February and every 3 months.  Standing orders for CBC and CMP were placed today. TB gold negative on 04/19/20.  Future order for TB gold will be placed today.  She has not had any recent infections.  Discussed the importance of holding simponi and otrexup if she develops signs or symptoms of an infection and to resume once the infection has completely cleared.    - Plan: CBC with Differential/Platelet, COMPLETE METABOLIC PANEL WITH GFR, QuantiFERON-TB Gold Plus  Screening for tuberculosis - Future order for TB gold placed today. Plan: QuantiFERON-TB Gold Plus  HLA B27 positive  Other medical  conditions are listed as follows:   History of sleep apnea  History of asthma  History of hernia repair  History of cholecystectomy  History of epilepsy  History of abdominal hernia  Family history of rheumatoid arthritis - Mother   History of gastric bypass  Orders: Orders Placed This Encounter  Procedures   CBC with Differential/Platelet   COMPLETE METABOLIC PANEL WITH GFR   QuantiFERON-TB Gold Plus   Sedimentation rate   Meds ordered this encounter  Medications   folic acid (FOLVITE) 1 MG tablet    Sig: TAKE 2 TABLETS(2MG TOTAL) BY MOUTH DAILY    Dispense:  180 tablet    Refill:  3      Follow-Up Instructions: Return in about 3 months (around 05/19/2021) for Psoriatic arthritis.   [name], PA-C  Note - This record has been created using Dragon software.  Chart creation errors have been sought, but may not always  have been located. Such creation errors do not reflect on  the standard of medical care.

## 2021-02-18 ENCOUNTER — Ambulatory Visit: Payer: BC Managed Care – PPO | Admitting: Physician Assistant

## 2021-02-18 ENCOUNTER — Encounter: Payer: Self-pay | Admitting: Physician Assistant

## 2021-02-18 ENCOUNTER — Other Ambulatory Visit: Payer: Self-pay

## 2021-02-18 VITALS — BP 144/85 | HR 81 | Ht 62.0 in | Wt 250.6 lb

## 2021-02-18 DIAGNOSIS — Z8669 Personal history of other diseases of the nervous system and sense organs: Secondary | ICD-10-CM

## 2021-02-18 DIAGNOSIS — Z8261 Family history of arthritis: Secondary | ICD-10-CM

## 2021-02-18 DIAGNOSIS — Z79899 Other long term (current) drug therapy: Secondary | ICD-10-CM | POA: Diagnosis not present

## 2021-02-18 DIAGNOSIS — L4050 Arthropathic psoriasis, unspecified: Secondary | ICD-10-CM | POA: Diagnosis not present

## 2021-02-18 DIAGNOSIS — Z1589 Genetic susceptibility to other disease: Secondary | ICD-10-CM

## 2021-02-18 DIAGNOSIS — Z111 Encounter for screening for respiratory tuberculosis: Secondary | ICD-10-CM | POA: Diagnosis not present

## 2021-02-18 DIAGNOSIS — Z9889 Other specified postprocedural states: Secondary | ICD-10-CM

## 2021-02-18 DIAGNOSIS — Z9884 Bariatric surgery status: Secondary | ICD-10-CM

## 2021-02-18 DIAGNOSIS — L409 Psoriasis, unspecified: Secondary | ICD-10-CM | POA: Diagnosis not present

## 2021-02-18 DIAGNOSIS — Z9049 Acquired absence of other specified parts of digestive tract: Secondary | ICD-10-CM

## 2021-02-18 DIAGNOSIS — Z8709 Personal history of other diseases of the respiratory system: Secondary | ICD-10-CM

## 2021-02-18 DIAGNOSIS — Z8719 Personal history of other diseases of the digestive system: Secondary | ICD-10-CM

## 2021-02-18 MED ORDER — FOLIC ACID 1 MG PO TABS: ORAL_TABLET | ORAL | 3 refills | Status: DC

## 2021-02-18 NOTE — Patient Instructions (Signed)
Standing Labs We placed an order today for your standing lab work.   Please have your standing labs drawn in February and every 3 months   If possible, please have your labs drawn 2 weeks prior to your appointment so that the provider can discuss your results at your appointment.  Please note that you may see your imaging and lab results in MyChart before we have reviewed them. We may be awaiting multiple results to interpret others before contacting you. Please allow our office up to 72 hours to thoroughly review all of the results before contacting the office for clarification of your results.  We have open lab daily: Monday through Thursday from 1:30-4:30 PM and Friday from 1:30-4:00 PM at the office of Dr. Shaili Deveshwar, [facility].   Please be advised, all patients with office appointments requiring lab work will take precedent over walk-in lab work.  If possible, please come for your lab work on Monday and Friday afternoons, as you may experience shorter wait times. The office is located at [address], [address], [place], [place] [address] No appointment is necessary.   Labs are drawn by [facility]. Please bring your co-pay at the time of your lab draw.  You may receive a bill from [facility] for your lab work.  If you wish to have your labs drawn at another location, please call the office 24 hours in advance to send orders.  If you have any questions regarding directions or hours of operation,  please call [phone].   As a reminder, please drink plenty of water prior to coming for your lab work. Thanks!  

## 2021-03-05 ENCOUNTER — Telehealth: Payer: Self-pay

## 2021-03-05 NOTE — Telephone Encounter (Signed)
I submitted a PA for [place] on [facility], [place]: [id number].  Approved. Effective from 03/05/2021 through 03/03/2024.

## 2021-04-18 ENCOUNTER — Other Ambulatory Visit: Payer: Self-pay | Admitting: *Deleted

## 2021-04-18 DIAGNOSIS — L4050 Arthropathic psoriasis, unspecified: Secondary | ICD-10-CM

## 2021-04-18 MED ORDER — OTREXUP 20 MG/0.4ML ~~LOC~~ SOAJ: 20.0000 mg | SUBCUTANEOUS | 0 refills | Status: DC

## 2021-04-18 NOTE — Telephone Encounter (Signed)
Refill request received via fax  Next Visit: 05/19/2021  Last Visit: 02/18/2021  Last Fill: 01/30/2021  DX: Psoriatic arthritis   Current Dose per office note 02/18/2021: Otrexup 20 mg every 7 days  Labs: 01/17/2021 Platelets are mildly elevated.  CMP is normal.  We will continue to monitor labs.  Okay to refill Otrexup?

## 2021-04-19 ENCOUNTER — Other Ambulatory Visit: Payer: Self-pay | Admitting: Physician Assistant

## 2021-04-19 DIAGNOSIS — L4050 Arthropathic psoriasis, unspecified: Secondary | ICD-10-CM

## 2021-04-21 NOTE — Telephone Encounter (Signed)
Left message to advise patient she is due to update labs..  

## 2021-04-21 NOTE — Telephone Encounter (Signed)
Patient is due to update lab work including TB gold.

## 2021-04-21 NOTE — Telephone Encounter (Signed)
Next Visit: 05/19/2021   Last Visit: 02/18/2021   Last Fill: 01/28/2021   DX: Psoriatic arthritis    Current Dose per office note 02/18/2021:    Labs: 01/17/2021 Platelets are mildly elevated.  CMP is normal.  We will continue to monitor labs.  TB Gold: 04/19/2020 Neg   Okay to refill Simponi?

## 2021-04-28 DIAGNOSIS — R5383 Other fatigue: Secondary | ICD-10-CM | POA: Diagnosis not present

## 2021-04-28 DIAGNOSIS — R638 Other symptoms and signs concerning food and fluid intake: Secondary | ICD-10-CM | POA: Diagnosis not present

## 2021-04-28 DIAGNOSIS — R635 Abnormal weight gain: Secondary | ICD-10-CM | POA: Diagnosis not present

## 2021-04-28 DIAGNOSIS — M2550 Pain in unspecified joint: Secondary | ICD-10-CM | POA: Diagnosis not present

## 2021-05-05 DIAGNOSIS — R5383 Other fatigue: Secondary | ICD-10-CM | POA: Diagnosis not present

## 2021-05-05 DIAGNOSIS — E559 Vitamin D deficiency, unspecified: Secondary | ICD-10-CM | POA: Diagnosis not present

## 2021-05-05 DIAGNOSIS — D519 Vitamin B12 deficiency anemia, unspecified: Secondary | ICD-10-CM | POA: Diagnosis not present

## 2021-05-05 DIAGNOSIS — E8889 Other specified metabolic disorders: Secondary | ICD-10-CM | POA: Diagnosis not present

## 2021-05-05 DIAGNOSIS — R635 Abnormal weight gain: Secondary | ICD-10-CM | POA: Diagnosis not present

## 2021-05-05 DIAGNOSIS — R7303 Prediabetes: Secondary | ICD-10-CM | POA: Diagnosis not present

## 2021-05-05 DIAGNOSIS — M2550 Pain in unspecified joint: Secondary | ICD-10-CM | POA: Diagnosis not present

## 2021-05-12 DIAGNOSIS — R7982 Elevated C-reactive protein (CRP): Secondary | ICD-10-CM | POA: Diagnosis not present

## 2021-05-12 DIAGNOSIS — E8889 Other specified metabolic disorders: Secondary | ICD-10-CM | POA: Diagnosis not present

## 2021-05-12 DIAGNOSIS — R635 Abnormal weight gain: Secondary | ICD-10-CM | POA: Diagnosis not present

## 2021-05-12 DIAGNOSIS — R891 Abnormal level of hormones in specimens from other organs, systems and tissues: Secondary | ICD-10-CM | POA: Diagnosis not present

## 2021-05-19 ENCOUNTER — Ambulatory Visit: Payer: BC Managed Care – PPO | Admitting: Rheumatology

## 2021-05-19 DIAGNOSIS — E559 Vitamin D deficiency, unspecified: Secondary | ICD-10-CM | POA: Diagnosis not present

## 2021-05-19 DIAGNOSIS — R635 Abnormal weight gain: Secondary | ICD-10-CM | POA: Diagnosis not present

## 2021-05-19 DIAGNOSIS — R7982 Elevated C-reactive protein (CRP): Secondary | ICD-10-CM | POA: Diagnosis not present

## 2021-05-19 DIAGNOSIS — E8889 Other specified metabolic disorders: Secondary | ICD-10-CM | POA: Diagnosis not present

## 2021-05-27 ENCOUNTER — Other Ambulatory Visit: Payer: Self-pay | Admitting: Physician Assistant

## 2021-05-27 DIAGNOSIS — L4050 Arthropathic psoriasis, unspecified: Secondary | ICD-10-CM

## 2021-06-02 DIAGNOSIS — Z13820 Encounter for screening for osteoporosis: Secondary | ICD-10-CM | POA: Diagnosis not present

## 2021-06-02 DIAGNOSIS — E559 Vitamin D deficiency, unspecified: Secondary | ICD-10-CM | POA: Diagnosis not present

## 2021-06-02 DIAGNOSIS — Z1231 Encounter for screening mammogram for malignant neoplasm of breast: Secondary | ICD-10-CM | POA: Diagnosis not present

## 2021-06-02 DIAGNOSIS — N951 Menopausal and female climacteric states: Secondary | ICD-10-CM | POA: Diagnosis not present

## 2021-06-02 DIAGNOSIS — M85852 Other specified disorders of bone density and structure, left thigh: Secondary | ICD-10-CM | POA: Diagnosis not present

## 2021-06-02 DIAGNOSIS — R635 Abnormal weight gain: Secondary | ICD-10-CM | POA: Diagnosis not present

## 2021-06-02 DIAGNOSIS — E8889 Other specified metabolic disorders: Secondary | ICD-10-CM | POA: Diagnosis not present

## 2021-06-02 DIAGNOSIS — R5383 Other fatigue: Secondary | ICD-10-CM | POA: Diagnosis not present

## 2021-06-12 ENCOUNTER — Other Ambulatory Visit: Payer: Self-pay | Admitting: *Deleted

## 2021-06-12 DIAGNOSIS — L4050 Arthropathic psoriasis, unspecified: Secondary | ICD-10-CM

## 2021-06-12 DIAGNOSIS — Z111 Encounter for screening for respiratory tuberculosis: Secondary | ICD-10-CM

## 2021-06-12 DIAGNOSIS — Z79899 Other long term (current) drug therapy: Secondary | ICD-10-CM | POA: Diagnosis not present
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
## 2021-06-13 NOTE — Progress Notes (Signed)
CBC WNL.  Potassium is elevated-5.5.  please notify the patient. Rest of CMP WNL.  ESR is borderline elevated-31.

## 2021-06-16 DIAGNOSIS — R5383 Other fatigue: Secondary | ICD-10-CM | POA: Diagnosis not present

## 2021-06-16 DIAGNOSIS — R635 Abnormal weight gain: Secondary | ICD-10-CM | POA: Diagnosis not present

## 2021-06-16 DIAGNOSIS — Z6841 Body Mass Index (BMI) 40.0 and over, adult: Secondary | ICD-10-CM | POA: Diagnosis not present

## 2021-06-16 DIAGNOSIS — E669 Obesity, unspecified: Secondary | ICD-10-CM | POA: Diagnosis not present

## 2021-06-16 DIAGNOSIS — E8889 Other specified metabolic disorders: Secondary | ICD-10-CM | POA: Diagnosis not present

## 2021-06-17 LAB — COMPLETE METABOLIC PANEL WITH GFR
AG Ratio: 1.4 (calc) (ref 1.0–2.5)
ALT: 16 U/L (ref 6–29)
AST: 15 U/L (ref 10–35)
Albumin: 3.9 g/dL (ref 3.6–5.1)
Alkaline phosphatase (APISO): 74 U/L (ref 37–153)
BUN: 23 mg/dL (ref 7–25)
CO2: 29 mmol/L (ref 20–32)
Calcium: 9.4 mg/dL (ref 8.6–10.4)
Chloride: 105 mmol/L (ref 98–110)
Creat: 0.75 mg/dL (ref 0.50–1.03)
Globulin: 2.7 g/dL (calc) (ref 1.9–3.7)
Glucose, Bld: 96 mg/dL (ref 65–99)
Potassium: 5.5 mmol/L — ABNORMAL HIGH (ref 3.5–5.3)
Sodium: 143 mmol/L (ref 135–146)
Total Bilirubin: 0.3 mg/dL (ref 0.2–1.2)
Total Protein: 6.6 g/dL (ref 6.1–8.1)
eGFR: 94 mL/min/{1.73_m2} (ref >=60)

## 2021-06-17 LAB — CBC WITH DIFFERENTIAL/PLATELET
Absolute Monocytes: 757 cells/uL (ref 200–950)
Basophils Absolute: 60 cells/uL (ref 0–200)
Basophils Relative: 0.7 %
Eosinophils Absolute: 120 cells/uL (ref 15–500)
Eosinophils Relative: 1.4 %
HCT: 41.1 % (ref 35.0–45.0)
Hemoglobin: 13.3 g/dL (ref 11.7–15.5)
Lymphs Abs: 3492 cells/uL (ref 850–3900)
MCH: 28.5 pg (ref 27.0–33.0)
MCHC: 32.4 g/dL (ref 32.0–36.0)
MCV: 88 fL (ref 80.0–100.0)
MPV: 8.9 fL (ref 7.5–12.5)
Monocytes Relative: 8.8 %
Neutro Abs: 4171 cells/uL (ref 1500–7800)
Neutrophils Relative %: 48.5 %
Platelets: 398 10*3/uL (ref 140–400)
RBC: 4.67 10*6/uL (ref 3.80–5.10)
RDW: 14.3 % (ref 11.0–15.0)
Total Lymphocyte: 40.6 %
WBC: 8.6 10*3/uL (ref 3.8–10.8)

## 2021-06-17 LAB — QUANTIFERON-TB GOLD PLUS
Mitogen-NIL: >10 IU/mL
NIL: 0.04 IU/mL
QuantiFERON-TB Gold Plus: NEGATIVE
TB1-NIL: 0 IU/mL
TB2-NIL: <0 IU/mL

## 2021-06-17 LAB — SEDIMENTATION RATE: Sed Rate: 31 mm/h — ABNORMAL HIGH (ref 0–30)

## 2021-06-17 NOTE — Progress Notes (Signed)
TB gold negative

## 2021-06-24 ENCOUNTER — Ambulatory Visit: Payer: BC Managed Care – PPO | Admitting: Rheumatology

## 2021-06-24 ENCOUNTER — Encounter: Payer: Self-pay | Admitting: Rheumatology

## 2021-06-24 VITALS — BP 118/81 | HR 69 | Ht 62.0 in | Wt 259.8 lb

## 2021-06-24 DIAGNOSIS — U071 COVID-19: Secondary | ICD-10-CM

## 2021-06-24 DIAGNOSIS — L409 Psoriasis, unspecified: Secondary | ICD-10-CM

## 2021-06-24 DIAGNOSIS — M79641 Pain in right hand: Secondary | ICD-10-CM

## 2021-06-24 DIAGNOSIS — Z8709 Personal history of other diseases of the respiratory system: Secondary | ICD-10-CM

## 2021-06-24 DIAGNOSIS — Z8719 Personal history of other diseases of the digestive system: Secondary | ICD-10-CM

## 2021-06-24 DIAGNOSIS — Z1589 Genetic susceptibility to other disease: Secondary | ICD-10-CM | POA: Diagnosis not present

## 2021-06-24 DIAGNOSIS — Z9049 Acquired absence of other specified parts of digestive tract: Secondary | ICD-10-CM

## 2021-06-24 DIAGNOSIS — Z79899 Other long term (current) drug therapy: Secondary | ICD-10-CM

## 2021-06-24 DIAGNOSIS — M79642 Pain in left hand: Secondary | ICD-10-CM

## 2021-06-24 DIAGNOSIS — Z8669 Personal history of other diseases of the nervous system and sense organs: Secondary | ICD-10-CM

## 2021-06-24 DIAGNOSIS — Z9884 Bariatric surgery status: Secondary | ICD-10-CM

## 2021-06-24 DIAGNOSIS — L4050 Arthropathic psoriasis, unspecified: Secondary | ICD-10-CM | POA: Diagnosis not present

## 2021-06-24 DIAGNOSIS — Z8261 Family history of arthritis: Secondary | ICD-10-CM

## 2021-06-24 DIAGNOSIS — Z9889 Other specified postprocedural states: Secondary | ICD-10-CM

## 2021-06-24 MED ORDER — SIMPONI 50 MG/0.5ML ~~LOC~~ SOAJ: SUBCUTANEOUS | 0 refills | Status: DC

## 2021-06-24 NOTE — Patient Instructions (Addendum)
Standing Labs ?We placed an order today for your standing lab work.  ? ?Please have your standing labs drawn in June and every 3 months ? ?If possible, please have your labs drawn 2 weeks prior to your appointment so that the provider can discuss your results at your appointment. ? ?Please note that you may see your imaging and lab results in [place] before we have reviewed them. ?We may be awaiting multiple results to interpret others before contacting you. ?Please allow our office up to 72 hours to thoroughly review all of the results before contacting the office for clarification of your results. ? ?We have open lab daily: ?Monday through Thursday from 1:30-4:30 PM and Friday from 1:30-4:00 PM ?at the office of Dr. Bo Merino, [facility].   ?Please be advised, all patients with office appointments requiring lab work will take precedent over walk-in lab work.  ?If possible, please come for your lab work on Monday and Friday afternoons, as you may experience shorter wait times. ?The office is located at [address], [place], [place], [place] [address] ?No appointment is necessary.   ?Labs are drawn by [facility]. Please bring your co-pay at the time of your lab draw.  You may receive a bill from [facility] for your lab work. ? ?Please note if you are on Hydroxychloroquine and and an order has been placed for a Hydroxychloroquine level, you will need to have it drawn 4 hours or more after your last dose. ? ?If you wish to have your labs drawn at another location, please call the office 24 hours in advance to send orders. ? ?If you have any questions regarding directions or hours of operation,  ?please call [phone].   ?As a reminder, please drink plenty of water prior to coming for your lab work. Thanks!  ? ?Vaccines ?You are taking a medication(s) that can suppress your immune system.  The following immunizations are recommended: ?Flu annually ?Covid-19  ?Td/Tdap (tetanus, diphtheria, pertussis)  every 10 years ?Pneumonia (Prevnar 15 then Pneumovax 23 at least 1 year apart.  Alternatively, can take Prevnar 20 without needing additional dose) ?Shingrix: 2 doses from 4 weeks to 6 months apart ? ?Please check with your PCP to make sure you are up to date.  ? ?If you have signs or symptoms of an infection or start antibiotics: ?First, call your PCP for workup of your infection. ?Hold your medication through the infection, until you complete your antibiotics, and until symptoms resolve if you take the following: ?Injectable medication (Actemra, Benlysta, Cimzia, Cosentyx, Enbrel, Humira, Kevzara, Orencia, Remicade, Simponi, [place], [place], [place]) ?Methotrexate ?Leflunomide Jolee Ewing) ?Mycophenolate (Cellcept) ?Roma Kayser, or Rinvoq  ? ? ?Heart Disease Prevention  ?Your inflammatory disease increases your risk of heart disease which includes heart attack, stroke, atrial fibrillation (irregular heartbeats), high blood pressure, heart failure and atherosclerosis (plaque in the arteries).  It is important to reduce your risk by:  ? ?Keep blood pressure, cholesterol, and blood sugar at healthy levels  ? ?Smoking Cessation  ? ?Maintain a healthy weight  ?BMI 20-25  ? ?Eat a healthy diet  ?Plenty of fresh fruit, vegetables, and whole grains  ?Limit saturated fats, foods high in sodium, and added sugars  ?DASH and Mediterranean diet  ? ?Increase physical activity  ?Recommend moderate physically activity for 150 minutes per week/ 30 minutes a day for five days a week These can be broken up into three separate ten-minute sessions during the day.  ? ?Reduce Stress  ?Meditation,  slow breathing exercises, yoga, coloring books  ?Dental visits twice a year   ?

## 2021-07-10 ENCOUNTER — Ambulatory Visit: Payer: BC Managed Care – PPO | Admitting: Neurology

## 2021-07-14 ENCOUNTER — Encounter: Payer: Self-pay | Admitting: Adult Health

## 2021-07-14 ENCOUNTER — Ambulatory Visit: Payer: BC Managed Care – PPO | Admitting: Adult Health

## 2021-07-14 ENCOUNTER — Other Ambulatory Visit: Payer: Self-pay | Admitting: *Deleted

## 2021-07-14 VITALS — BP 132/92 | HR 75 | Ht 62.0 in | Wt 259.0 lb

## 2021-07-14 DIAGNOSIS — G40209 Localization-related (focal) (partial) symptomatic epilepsy and epileptic syndromes with complex partial seizures, not intractable, without status epilepticus: Secondary | ICD-10-CM

## 2021-07-14 DIAGNOSIS — G43009 Migraine without aura, not intractable, without status migrainosus: Secondary | ICD-10-CM

## 2021-07-14 DIAGNOSIS — L4050 Arthropathic psoriasis, unspecified: Secondary | ICD-10-CM

## 2021-07-14 MED ORDER — TOPIRAMATE ER 50 MG PO CAP24: 3.0000 | ORAL_CAPSULE | Freq: Every day | ORAL | 3 refills | Status: DC

## 2021-07-14 MED ORDER — OTREXUP 20 MG/0.4ML ~~LOC~~ SOAJ: 20.0000 mg | SUBCUTANEOUS | 0 refills | Status: DC

## 2021-07-14 NOTE — Progress Notes (Signed)
? ? ?Reason for visit: Seizures, headache ? ?AVAGAIL WHITTLESEY is an 56 y.o. female ? ?History of present illness: ? ? ?Ms. Maudie Mercury is a 56 year old right-handed white female with a history of well-controlled seizures and migraine headaches.  Reports approximately 6 migraines over the past year. She has remained on Trokendi 150 mg nightly, denies side effects.  She has not had any recent seizure activity.  Continues to drive and maintains ADLs and IADLs independently.  Continues to follow with rheumatology for psoriatic arthritis.  No new concerns at this time. ? ? ? ? ?Past Medical History:  ?Diagnosis Date  ? Allergic rhinitis, cause unspecified   ? Anal fissure   ? Arthritis   ? inflammatory and psoriatic--Dr. Estanislado Pandy  ? Asthma   ? related to allergies and colds  ? Chronic bronchitis   ? gets bronchitis yearly with colds  ? Chronic kidney disease kidney stones  ? Fracture   ? stress fracture right foot  ? GERD (gastroesophageal reflux disease)   ? Hernia (acquired) (recurrent)   ? R lower abdomen; has seen [facility] (Dr. Zena Amos and recurred  ? Hiatal hernia 03/2011  ? Impaired fasting glucose   ? Internal hemorrhoids 03/2011  ? Menstrual migraine   ? Dr, Jannifer Franklin  ? Obesity   ? Reflux esophagitis 03/2011  ? Seizure disorder ([place])   ? f/b Dr. Jannifer Franklin  ? Seizures ([place]) last seisure june 2012  ? Sleep apnea   ? inconclusive test per pt (some central and obstructive component)  ? Sleep apnea 06/07/09  ? mild complex sleep apnea, worse in supine position (uses CPAP intermittently only)  ? Tinea cruris 7/09  ? ? ?Past Surgical History:  ?Procedure Laterality Date  ? BARIATRIC SURGERY  09/22/2017  ? CESAREAN SECTION    ? x2  ? CHOLECYSTECTOMY  2005  ? COLONOSCOPY  03/19/11  ? Dr. Olevia Perches; internal hemorrhoids  ? ESOPHAGOGASTRODUODENOSCOPY  03/19/11  ? hiatal hernia, esophagitis  ? KIDNEY STONE SURGERY  08/2010  ? retrieved with basket (at [facility])  ? repair of incisional hernia  2005, 2008  ? related to laparoscopic  cholecystectomy  ? ? ?Family History  ?Problem Relation Age of Onset  ? Arthritis Mother   ?     rheumatoid  ? Hyperlipidemia Mother   ? Hypertension Mother   ? Hyperthyroidism Mother   ?     s/p thyroidectomy, now with hypothyroidism  ? Cancer Father 19  ?     AML, lung cancer, kidney cancer (all presented at same time)  ? Diabetes Father   ? Hypertension Father   ? Hyperlipidemia Father   ? Psoriasis Father   ? Anxiety disorder Father   ?     OCD, nervous breakdown  ? Psoriasis Sister   ? Irritable bowel syndrome Sister   ? Arthritis Sister   ?     psoriatic  ? Diabetes Sister   ? Stroke Maternal Aunt   ? Diabetes Paternal Uncle   ? Crohn's disease Paternal Uncle   ? Cancer Paternal Uncle   ?     ? type  ? Colitis Paternal Uncle   ? Asthma Maternal Grandmother   ? Hyperlipidemia Maternal Grandmother   ? Heart disease Maternal Grandmother   ? Stroke Maternal Grandmother   ? Diabetes Maternal Grandfather   ? Heart disease Maternal Grandfather   ? Cancer Paternal Grandmother 75  ?     female (?ovarian)  ? Diabetes Paternal Grandmother   ?  Hypertension Paternal Grandmother   ? Diabetes Paternal Grandfather   ? Heart disease Paternal Grandfather   ? Hypertension Paternal Grandfather   ? GER disease Daughter   ? Asthma Son   ? Stroke Cousin   ? Turner syndrome Niece   ? Esophageal cancer Neg Hx   ? Stomach cancer Neg Hx   ? ? ?Social history:  reports that she has never smoked. She has never been exposed to tobacco smoke. She has never used smokeless tobacco. She reports that she does not currently use alcohol. She reports that she does not use drugs. ? ?  ?Allergies  ?Allergen Reactions  ? Cosentyx [Secukinumab] Anaphylaxis  ? Hydrocodone Itching and Palpitations  ? Vimpat [Lacosamide] Itching  ?  Itching  ? Amoxicillin Hives  ? Codeine Itching and Other (See Comments)  ?  Heart races.  ? Keppra [Levetiracetam]   ?  Suicide ideation  ? Sulfa Antibiotics Nausea Only  ? Transderm-Scop [Scopolamine] Other (See Comments)   ?  Stopped breathing.  ? ? ?Medications:  ?Prior to Admission medications   ?Medication Sig Start Date End Date Taking? Authorizing Provider  ?acetaminophen (TYLENOL) 500 MG tablet as needed.  09/24/17  Yes [provider]  ?albuterol (PROVENTIL HFA;VENTOLIN HFA) 108 (90 BASE) MCG/ACT inhaler Inhale 2 puffs into the lungs as needed.   Yes [provider]  ?clobetasol cream (TEMOVATE) 3.42 % Apply 1 application topically 2 (two) times daily. 04/19/20  Yes [name], PA-C  ?Cyanocobalamin (VITAMIN B-12 PO) Take by mouth daily.   Yes [provider]  ?diclofenac sodium (VOLTAREN) 1 % GEL Apply 3 g to 3 large joints up to 3 times daily. 02/16/18  Yes Deveshwar, Abel Presto, MD  ?fluconazole (DIFLUCAN) 150 MG tablet Take 1 tablet (150 mg) by mouth every 72 hours for recurrent yeast infection. 05/16/19  Yes [name], PA-C  ?folic acid (FOLVITE) 1 MG tablet TAKE 2 TABLETS('2MG'$  TOTAL) BY MOUTH DAILY 09/28/18  Yes Deveshwar, Abel Presto, MD  ?ibuprofen (ADVIL,MOTRIN) 200 MG tablet Take 400 mg by mouth every 6 (six) hours as needed.   Yes [provider]  ?Methotrexate, PF, (OTREXUP) 20 MG/0.4ML SOAJ Inject 20 mg into the skin once a week. 07/05/20  Yes [name], PA-C  ?Multiple Minerals-Vitamins (CALCIUM-MAGNESIUM-ZINC-D3) TABS Take 2 tablets by mouth daily.   Yes [provider]  ?pantoprazole (PROTONIX) 40 MG tablet Take 40 mg by mouth daily. 09/24/17  Yes [provider]  ?SENNA PO Take by mouth as needed.   Yes [provider]  ?SIMPONI 50 MG/0.5ML SOAJ INJECT THE CONTENTS OF 1 PEN (50 MG) UNDER THE SKIN EVERY 28 DAYS 04/30/20  Yes Deveshwar, Abel Presto, MD  ?Topiramate ER (TROKENDI XR) 50 MG CP24 Take 3 capsules by mouth at bedtime. 07/10/19  Yes Kathrynn Ducking, MD  ?VITAMIN D PO Take by mouth daily.   Yes [provider]  ?predniSONE (DELTASONE) 5 MG tablet Take 4 tablets by mouth daily x2 days, 3 tablets by mouth daily x2 days, 2 tablets by mouth daily  x2 days, 1 tablet by mouth daily x2 days. 04/19/20   [name], PA-C  ? ? ?ROS: ? ?Out of a complete 14 system review of symptoms, the patient complains only of the following symptoms, and all other reviewed systems are negative. ? ?History of seizures ?Joint pains ?Weight gain ? ? ? ? ?Objective: ?Today's Vitals  ? 07/14/21 1033  ?BP: (!) 132/92  ?Pulse: 75  ?Weight: 259 lb (  117.5 kg)  ?Height: '5\' 2"'$  (1.575 m)  ? ?Body mass index is 47.37 kg/m?. ? ? ?General: well developed, well nourished, very pleasant middle-age Caucasian female, seated, in no evident distress ?Head: head normocephalic and atraumatic.   ?Neck: supple with no carotid or supraclavicular bruits ?Cardiovascular: regular rate and rhythm, no murmurs ?Musculoskeletal: limited flexion of hands bilaterally d/t psoriatic arthritis ?Skin:  no rash/petichiae ?Vascular:  Normal pulses all extremities ?  ?Neurologic Exam ?Mental Status: Awake and fully alert. Oriented to place and time. Recent and remote memory intact. Attention span, concentration and fund of knowledge appropriate. Mood and affect appropriate.  ?Cranial Nerves: Fundoscopic exam reveals sharp disc margins. Pupils equal, briskly reactive to light. Extraocular movements full without nystagmus. Visual fields full to confrontation. Hearing intact. Facial sensation intact. Face, tongue, palate moves normally and symmetrically.  ?Motor: Normal bulk and tone. Normal strength in all tested extremity muscles ?Sensory.: intact to touch , pinprick , position and vibratory sensation.  ?Coordination: Rapid alternating movements normal in all extremities. Finger-to-nose and heel-to-shin performed accurately bilaterally. ?Gait and Station: Arises from chair without difficulty. Stance is wide based. Gait demonstrates normal stride length and balance without use of assistive device.  ?Reflexes: 1+ and symmetric. Toes downgoing.  ? ? ? ? ? ?Assessment/Plan: ? ?1.  History of seizures, well controlled ? ?2.   Migraine headache ? ? ?Doing well since prior visit 1 year ago. Will continue Trokendi '150mg'$  nightly. Refill provided.  She was a prior patient of Dr. Jannifer Franklin. Request she establish care with Dr. Leta Baptist but as

## 2021-07-14 NOTE — Patient Instructions (Addendum)
Continue nightly use of Trokendi 150 mg daily ? ?Please call with any worsening migraine headaches or seizure activity ? ? ?Follow-up in 1 year or call earlier if needed wide-based wide-based she establish refill provided.  And discussion ?

## 2021-07-14 NOTE — Telephone Encounter (Signed)
Refill request received via fax ? ?Next Visit: 11/24/2021 ? ?Last Visit: 06/24/2021 ? ?Last Fill: 04/18/2021 ? ?DX: Psoriatic arthritis  ? ?Current Dose per office note 06/24/2021: Otrexup 20 mg every 7 days ? ?Labs:  06/12/2021 CBC WNL.  Potassium is elevated-5.5.  Rest of CMP WNL ? ?Okay to refill Otrexup?  ?

## 2021-07-21 DIAGNOSIS — Z6841 Body Mass Index (BMI) 40.0 and over, adult: Secondary | ICD-10-CM | POA: Diagnosis not present

## 2021-07-21 DIAGNOSIS — E8889 Other specified metabolic disorders: Secondary | ICD-10-CM | POA: Diagnosis not present

## 2021-07-21 DIAGNOSIS — R7982 Elevated C-reactive protein (CRP): Secondary | ICD-10-CM | POA: Diagnosis not present

## 2021-07-21 DIAGNOSIS — E6609 Other obesity due to excess calories: Secondary | ICD-10-CM | POA: Diagnosis not present

## 2021-08-06 DIAGNOSIS — J069 Acute upper respiratory infection, unspecified: Secondary | ICD-10-CM | POA: Diagnosis not present

## 2021-08-24 ENCOUNTER — Other Ambulatory Visit: Payer: Self-pay | Admitting: Rheumatology

## 2021-08-24 DIAGNOSIS — L4050 Arthropathic psoriasis, unspecified: Secondary | ICD-10-CM

## 2021-08-25 NOTE — Telephone Encounter (Signed)
Next Visit: 11/24/2021   Last Visit: 06/24/2021   Last Fill: 06/24/2021  DX: Psoriatic arthritis    Current Dose per office note 06/24/2021: Simponi 50 mg sq injections every 28 days  Labs:  06/12/2021 CBC WNL.  Potassium is elevated-5.5.  Rest of CMP WNL  TB Gold: 06/12/2021 Neg   Okay to refill Simponi?

## 2021-09-08 DIAGNOSIS — R197 Diarrhea, unspecified: Secondary | ICD-10-CM | POA: Diagnosis not present

## 2021-09-09 DIAGNOSIS — R197 Diarrhea, unspecified: Secondary | ICD-10-CM | POA: Diagnosis not present

## 2021-09-22 ENCOUNTER — Other Ambulatory Visit: Payer: Self-pay | Admitting: Rheumatology

## 2021-09-22 ENCOUNTER — Encounter: Payer: Self-pay | Admitting: Rheumatology

## 2021-09-22 DIAGNOSIS — L409 Psoriasis, unspecified: Secondary | ICD-10-CM

## 2021-09-22 NOTE — Telephone Encounter (Signed)
Attempted to contact the patient and left message for patient to call the office.  

## 2021-09-22 NOTE — Telephone Encounter (Signed)
Patient called the office stating she is having a bad flare after a vacation. Patient states its both hands. Knees and her right foot. Patient states she is taking ibuprofen and cant walk. Patient requests a refill of prednisone and clobetasol cream 0.05% be sent to [facility] in [place]. Patient requests call once its been sent in.

## 2021-09-23 MED ORDER — CLOBETASOL PROPIONATE 0.05 % EX CREA: 1.0000 | TOPICAL_CREAM | Freq: Two times a day (BID) | CUTANEOUS | 0 refills | Status: DC

## 2021-09-23 MED ORDER — PREDNISONE 5 MG PO TABS: ORAL_TABLET | ORAL | 0 refills | Status: DC

## 2021-09-23 NOTE — Telephone Encounter (Signed)
Attempted to contact the patient and left message for patient to call the office.  

## 2021-09-23 NOTE — Addendum Note (Signed)
Addended by: Carole Binning on: 09/23/2021 12:48 PM   Modules accepted: Orders

## 2021-09-23 NOTE — Telephone Encounter (Signed)
Patient states a few weeks ago she got really sick. Patient was diagnosed with Campylobacter enteritis. Patient states she had severe diarrhea and fevers. Patient states her symptoms resolved. Patient states she does not have a follow up regarding this.  Patient states she took her Simponi on 09/22/2021. Patient states she resumed her Otrexup on 09/17/2021 and next dose is due to tomorrow. Patient states she having a flare in bilateral knees, right foot and in bilateral hands. Patient states she has increased the amount of Ibuprofen she has been taking. She is having trouble dressing and driving. Patient states she has been working from home due to not being able to get around. Patient states she does not want to lose her job due to not being able to go in. Patient is requesting a prescription for Prednisone.

## 2021-09-23 NOTE — Telephone Encounter (Signed)
Please send a prescription for prednisone starting at 20 mg and taper by 5 mg every 4 days.

## 2021-09-30 ENCOUNTER — Other Ambulatory Visit: Payer: Self-pay | Admitting: Rheumatology

## 2021-09-30 DIAGNOSIS — L4050 Arthropathic psoriasis, unspecified: Secondary | ICD-10-CM

## 2021-09-30 NOTE — Telephone Encounter (Signed)
Next Visit: 11/24/2021   Last Visit: 06/24/2021   Last Fill: 06/24/2021   DX: Psoriatic arthritis    Current Dose per office note 06/24/2021: Simponi 50 mg sq injections every 28 days   Labs:  09/08/2021 MCHC 32.6, MPV 6.7, Monocyte Absolute 1.1   TB Gold: 06/12/2021 Neg    Okay to refill Simponi?

## 2021-11-10 NOTE — Progress Notes (Deleted)
Office Visit Note  Patient: Tiffany Wilkins             Date of Birth: 23-Feb-1966           MRN: [id number]             PCP: Kathe Becton, DO Referring: Kathe Becton, DO Visit Date: 11/24/2021 Occupation: '@GUAROCC'$ @  Subjective:  No chief complaint on file.   History of Present Illness: Tiffany Wilkins is a 56 y.o. female ***   Activities of Daily Living:  Patient reports morning stiffness for *** {minute/hour:19697}.   Patient {ACTIONS;DENIES/REPORTS:21021675::"Denies"} nocturnal pain.  Difficulty dressing/grooming: {ACTIONS;DENIES/REPORTS:21021675::"Denies"} Difficulty climbing stairs: {ACTIONS;DENIES/REPORTS:21021675::"Denies"} Difficulty getting out of chair: {ACTIONS;DENIES/REPORTS:21021675::"Denies"} Difficulty using hands for taps, buttons, cutlery, and/or writing: {ACTIONS;DENIES/REPORTS:21021675::"Denies"}  No Rheumatology ROS completed.   PMFS History:  Patient Active Problem List   Diagnosis Date Noted  . Primary osteoarthritis of both knees 12/11/2016  . High risk medication use 06/30/2016  . Right hand pain 06/30/2016  . Right hip pain 06/30/2016  . Hepatic steatosis 06/27/2014  . BMI 50.0-59.9, adult ([place]) 06/27/2014  . Fibroid, uterine 06/27/2014  . Abdominal pain, chronic, right lower quadrant 06/27/2014  . Obesity hypoventilation syndrome ([place]) 12/08/2013  . Nocturnal seizures ([place]) 12/08/2013  . OSA (obstructive sleep apnea) 12/08/2013  . Noncompliance with CPAP treatment 12/08/2013  . Psoriasis 11/29/2013  . Seizure disorder, complex partial ([place]) 06/01/2012  . Migraine 06/01/2012  . OSA on CPAP 01/04/2012  . Hiatal hernia 03/26/2011  . Internal hemorrhoid 03/24/2011  . Reflux esophagitis 03/24/2011  . Psoriatic arthritis ([place]) 01/22/2011  . Hernia of abdominal wall 01/22/2011    Past Medical History:  Diagnosis Date  . Allergic rhinitis, cause unspecified   . Anal fissure   . Arthritis    inflammatory and psoriatic--Dr. Estanislado Pandy  .  Asthma    related to allergies and colds  . Chronic bronchitis    gets bronchitis yearly with colds  . Chronic kidney disease kidney stones  . Fracture    stress fracture right foot  . GERD (gastroesophageal reflux disease)   . Hernia (acquired) (recurrent)    R lower abdomen; has seen [facility] (Dr. Zena Amos and recurred  . Hiatal hernia 03/2011  . Impaired fasting glucose   . Internal hemorrhoids 03/2011  . Menstrual migraine    Dr, Jannifer Franklin  . Obesity   . Reflux esophagitis 03/2011  . Seizure disorder ([place])    f/b Dr. Jannifer Franklin  . Seizures ([place]) last seisure june 2012  . Sleep apnea    inconclusive test per pt (some central and obstructive component)  . Sleep apnea 06/07/09   mild complex sleep apnea, worse in supine position (uses CPAP intermittently only)  . Tinea cruris 7/09    Family History  Problem Relation Age of Onset  . Arthritis Mother        rheumatoid  . Hyperlipidemia Mother   . Hypertension Mother   . Hyperthyroidism Mother        s/p thyroidectomy, now with hypothyroidism  . Cancer Father 69       AML, lung cancer, kidney cancer (all presented at same time)  . Diabetes Father   . Hypertension Father   . Hyperlipidemia Father   . Psoriasis Father   . Anxiety disorder Father        OCD, nervous breakdown  . Psoriasis Sister   . Irritable bowel syndrome Sister   . Arthritis Sister        psoriatic  .  Diabetes Sister   . Stroke Maternal Aunt   . Diabetes Paternal Uncle   . Crohn's disease Paternal Uncle   . Cancer Paternal Uncle        ? type  . Colitis Paternal Uncle   . Asthma Maternal Grandmother   . Hyperlipidemia Maternal Grandmother   . Heart disease Maternal Grandmother   . Stroke Maternal Grandmother   . Diabetes Maternal Grandfather   . Heart disease Maternal Grandfather   . Cancer Paternal Grandmother 12       female (?ovarian)  . Diabetes Paternal Grandmother   . Hypertension Paternal Grandmother   . Diabetes Paternal Grandfather   .  Heart disease Paternal Grandfather   . Hypertension Paternal Grandfather   . GER disease Daughter   . Asthma Son   . Stroke Cousin   . Turner syndrome Niece   . Esophageal cancer Neg Hx   . Stomach cancer Neg Hx    Past Surgical History:  Procedure Laterality Date  . BARIATRIC SURGERY  09/22/2017  . CESAREAN SECTION     x2  . CHOLECYSTECTOMY  2005  . COLONOSCOPY  03/19/11   Dr. Olevia Perches; internal hemorrhoids  . ESOPHAGOGASTRODUODENOSCOPY  03/19/11   hiatal hernia, esophagitis  . KIDNEY STONE SURGERY  08/2010   ret[facility] with basket (at [facility])  . repair of incisional hernia  2005, 2008   related to laparoscopic cholecystectomy   Social History   Social History Narrative   Patient Lives with husband [name])  and 2 children (son and daughter, 24, 56)   Patient is right handed.   Patient drinks 2 cups daily- coffee/soda            Immunization History  Administered Date(s) Administered  . Influenza Split 12/29/2010  . Influenza,inj,Quad PF,6+ Mos 11/29/2013  . Tdap 11/29/2013     Objective: Vital Signs: LMP 06/12/2014    Physical Exam   Musculoskeletal Exam: ***  CDAI Exam: CDAI Score: -- Patient Global: --; Provider Global: -- Swollen: --; Tender: -- Joint Exam 11/24/2021   No joint exam has been documented for this visit   There is currently no information documented on the homunculus. Go to the Rheumatology activity and complete the homunculus joint exam.  Investigation: No additional findings.  Imaging: No results found.  Recent Labs: Lab Results  Component Value Date   WBC 8.6 06/12/2021   HGB 13.3 06/12/2021   PLT 398 06/12/2021   NA 143 06/12/2021   K 5.5 (H) 06/12/2021   CL 105 06/12/2021   CO2 29 06/12/2021   GLUCOSE 96 06/12/2021   BUN 23 06/12/2021   CREATININE 0.75 06/12/2021   BILITOT 0.3 06/12/2021   ALKPHOS 75 10/16/2016   AST 15 06/12/2021   ALT 16 06/12/2021   PROT 6.6 06/12/2021   ALBUMIN 4.2 10/16/2016   CALCIUM 9.4  06/12/2021   GFRAA 114 09/18/2020   QFTBGOLDPLUS NEGATIVE 06/12/2021    Speciality Comments: TB Gold: 10/16/16 Neg Failed Cosentyx-developed rash that sent her to the ER Does not want to try TNF-inhibitor due to neurologic risk since she has epilepsy  Procedures:  No procedures performed Allergies: Cosentyx [secukinumab], Hydrocodone, Vimpat [lacosamide], Amoxicillin, Codeine, Keppra [levetiracetam], Sulfa antibiotics, and Transderm-scop [scopolamine]   Assessment / Plan:     Visit Diagnoses: No diagnosis found.  Orders: No orders of the defined types were placed in this encounter.  No orders of the defined types were placed in this encounter.   Face-to-face time spent with patient  was *** minutes. Greater than 50% of time was spent in counseling and coordination of care.  Follow-Up Instructions: No follow-ups on file.   Earnestine Mealing, CMA  Note - This record has been created using Editor, commissioning.  Chart creation errors have been sought, but may not always  have been located. Such creation errors do not reflect on  the standard of medical care.

## 2021-11-21 NOTE — Progress Notes (Addendum)
Office Visit Note  Patient: Tiffany Wilkins             Date of Birth: January 01, 1966           MRN: [id number]             PCP: Kathe Becton, DO Referring: Kathe Becton, DO Visit Date: 11/26/2021 Occupation: @GUAROCC @  Subjective:  Pain in multiple joints   History of Present Illness: Tiffany Wilkins is a 56 y.o. female with history of psoriatic arthritis. She remains on Simponi 50 mg sq injections every 28 days, Otrexup 20 mg every 7 days, and folic acid 1 mg 2 tablets daily.  Her last dose of Simponi was administered on 11/16/2021.  She has been experiencing recurrent flares despite remaining on Simponi and methotrexate without interruption.  She is currently having pain and swelling in her right hand.  She has incomplete fist formation bilaterally.  She is also been experiencing increased pain and stiffness in her right knee and both feet.  She has ongoing psoriasis behind both ears, umbilical region, and gluteal region.  She has been using clobetasol cream topically as needed.  She states that she is also having a yeast infection under both breasts as well as in the inguinal region.  She requested a refill of Diflucan which has been helpful in the past.   Activities of Daily Living:  Patient reports morning stiffness for 30 min -1 hour.   Patient Reports nocturnal pain.  Difficulty dressing/grooming: Reports Difficulty climbing stairs: Reports Difficulty getting out of chair: Reports Difficulty using hands for taps, buttons, cutlery, and/or writing: Reports  Review of Systems  Constitutional:  Positive for fatigue.  HENT:  Negative for mouth sores, mouth dryness and nose dryness.   Eyes:  Negative for pain, visual disturbance and dryness.  Respiratory:  Negative for cough, hemoptysis, shortness of breath and difficulty breathing.   Cardiovascular:  Negative for chest pain, palpitations, hypertension and swelling in legs/feet.  Gastrointestinal:  Positive for constipation and  diarrhea. Negative for blood in stool.  Genitourinary:  Positive for involuntary urination. Negative for painful urination.  Musculoskeletal:  Positive for joint pain, gait problem, joint pain, joint swelling and morning stiffness. Negative for myalgias, muscle weakness, muscle tenderness and myalgias.  Skin:  Positive for rash, hair loss and sensitivity to sunlight. Negative for color change, pallor, nodules/bumps, skin tightness and ulcers.  Allergic/Immunologic: Positive for susceptible to infections.  Neurological:  Negative for dizziness, numbness, headaches and weakness.  Hematological:  Negative for swollen glands.  Psychiatric/Behavioral:  Positive for sleep disturbance. Negative for depressed mood. The patient is nervous/anxious.     PMFS History:  Patient Active Problem List   Diagnosis Date Noted   Primary osteoarthritis of both knees 12/11/2016   High risk medication use 06/30/2016   Right hand pain 06/30/2016   Right hip pain 06/30/2016   Hepatic steatosis 06/27/2014   BMI 50.0-59.9, adult ([place]) 06/27/2014   Fibroid, uterine 06/27/2014   Abdominal pain, chronic, right lower quadrant 06/27/2014   Obesity hypoventilation syndrome ([place]) 12/08/2013   Nocturnal seizures ([place]) 12/08/2013   OSA (obstructive sleep apnea) 12/08/2013   Noncompliance with CPAP treatment 12/08/2013   Psoriasis 11/29/2013   Seizure disorder, complex partial ([place]) 06/01/2012   Migraine 06/01/2012   OSA on CPAP 01/04/2012   Hiatal hernia 03/26/2011   Internal hemorrhoid 03/24/2011   Reflux esophagitis 03/24/2011   Psoriatic arthritis ([place]) 01/22/2011   Hernia of abdominal wall 01/22/2011  Past Medical History:  Diagnosis Date   Allergic rhinitis, cause unspecified    Anal fissure    Arthritis    inflammatory and psoriatic--Dr. Estanislado Pandy   Asthma    related to allergies and colds   Chronic bronchitis    gets bronchitis yearly with colds   Chronic kidney disease kidney stones   Fracture     stress fracture right foot   GERD (gastroesophageal reflux disease)    Hernia (acquired) (recurrent)    R lower abdomen; has seen [facility] (Dr. Zena Amos and recurred   Hiatal hernia 03/2011   Impaired fasting glucose    Internal hemorrhoids 03/2011   Menstrual migraine    Dr, Jannifer Franklin   Obesity    Reflux esophagitis 03/2011   Seizure disorder ([place])    f/b Dr. Jannifer Franklin   Seizures [facility]) last seisure june 2012   Sleep apnea    inconclusive test per pt (some central and obstructive component)   Sleep apnea 06/07/09   mild complex sleep apnea, worse in supine position (uses CPAP intermittently only)   Tinea cruris 7/09    Family History  Problem Relation Age of Onset   Arthritis Mother        rheumatoid   Hyperlipidemia Mother    Hypertension Mother    Hyperthyroidism Mother        s/p thyroidectomy, now with hypothyroidism   Cancer Father 30       AML, lung cancer, kidney cancer (all presented at same time)   Diabetes Father    Hypertension Father    Hyperlipidemia Father    Psoriasis Father    Anxiety disorder Father        OCD, nervous breakdown   Psoriasis Sister    Irritable bowel syndrome Sister    Arthritis Sister        psoriatic   Diabetes Sister    Stroke Maternal Aunt    Diabetes Paternal Uncle    Crohn's disease Paternal Uncle    Cancer Paternal Uncle        ? type   Colitis Paternal Uncle    Asthma Maternal Grandmother    Hyperlipidemia Maternal Grandmother    Heart disease Maternal Grandmother    Stroke Maternal Grandmother    Diabetes Maternal Grandfather    Heart disease Maternal Grandfather    Cancer Paternal Grandmother 45       female (?ovarian)   Diabetes Paternal Grandmother    Hypertension Paternal Grandmother    Diabetes Paternal Grandfather    Heart disease Paternal Grandfather    Hypertension Paternal Grandfather    GER disease Daughter    Asthma Son    Stroke Cousin    Turner syndrome Niece    Esophageal cancer Neg Hx    Stomach  cancer Neg Hx    Past Surgical History:  Procedure Laterality Date   BARIATRIC SURGERY  09/22/2017   CESAREAN SECTION     x2   CHOLECYSTECTOMY  2005   COLONOSCOPY  03/19/11   Dr. Olevia Perches; internal hemorrhoids   ESOPHAGOGASTRODUODENOSCOPY  03/19/11   hiatal hernia, esophagitis   KIDNEY STONE SURGERY  08/2010   retrieved with basket (at [facility])   repair of incisional hernia  2005, 2008   related to laparoscopic cholecystectomy   Social History   Social History Narrative   Patient Lives with husband [name])  and 2 children (son and daughter, 29, 52)   Patient is right handed.   Patient drinks 2 cups daily- coffee/soda  Immunization History  Administered Date(s) Administered   Influenza Split 12/29/2010   Influenza,inj,Quad PF,6+ Mos 11/29/2013   Tdap 11/29/2013     Objective: Vital Signs: BP 127/84 (BP Location: Left Arm, Patient Position: Sitting, Cuff Size: Large)   Pulse 82   Resp 15   Ht 5' 2"  (1.575 m)   Wt 266 lb (120.7 kg)   LMP 06/12/2014   BMI 48.65 kg/m    Physical Exam Vitals and nursing note reviewed.  Constitutional:      Appearance: She is well-developed.  HENT:     Head: Normocephalic and atraumatic.  Eyes:     Conjunctiva/sclera: Conjunctivae normal.  Cardiovascular:     Rate and Rhythm: Normal rate and regular rhythm.     Heart sounds: Normal heart sounds.  Pulmonary:     Effort: Pulmonary effort is normal.     Breath sounds: Normal breath sounds.  Abdominal:     General: Bowel sounds are normal.     Palpations: Abdomen is soft.  Musculoskeletal:     Cervical back: Normal range of motion.  Skin:    General: Skin is warm and dry.     Capillary Refill: Capillary refill takes less than 2 seconds.     Comments: Psoriasis behind both ears, umbilical region, and gluteal crease.  Bilateral inframammary yeast infection  Neurological:     Mental Status: She is alert and oriented to person, place, and time.  Psychiatric:         Behavior: Behavior normal.      Musculoskeletal Exam: C-spine, thoracic spine, and lumbar spine good ROM.  Shoulder joints, elbows, and wrist joints have good ROM.  Tenderness and synovitis of the right 2nd and 3rd PIP joints.  Ankylosis of PIP and DIPs. Hip joints have good ROM.  Tenderness over the left piriformis.  Painful ROM of the right knee.  Mild warmth of right knee. Left knee joint has good ROM with no warmth or effusion.  Ankle joints have good ROM with no tenderness or joint swelling.  No achilles tendonitis or plantar fasciitis.   CDAI Exam: CDAI Score: -- Patient Global: --; Provider Global: -- Swollen: 2 ; Tender: 2  Joint Exam 11/26/2021      Right  Left  PIP 2  Swollen Tender     PIP 3  Swollen Tender        Investigation: No additional findings.  Imaging: XR KNEE 3 VIEW RIGHT  Result Date: 12/02/2021 No medial or lateral compartment narrowing was noted.  Moderate patellofemoral narrowing was noted.  Erosion was noted on the patella. 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  XR Foot 2 Views Left  Result Date: 12/02/2021 Juxta-articular osteopenia was noted.  First MTP, PIP and DIP narrowing was noted.  Intertarsal narrowing with dorsal spurring was noted.  No tibiotalar or subtalar joint space narrowing was noted.  Inferior and posterior calcaneal spurs were noted.  No radiographic progression was noted when compared to the x-rays of 2021. Impression: These findings are consistent with inflammatory arthritis and osteoarthritis overlap.  XR Foot 2 Views Right  Result Date: 12/02/2021 Juxta-articular osteopenia was noted.  PIP and DIP narrowing was noted.  Third MTP narrowing was noted.  Cystic versus erosive changes were noted in the second and fourth MTPs.  Tibiotalar narrowing was noted.  Dorsal spurring was noted.  Intertarsal narrowing was noted.  Inferior and posterior calcaneal spurs were noted.  No  significant radiographic progression was noted  when compared to the x-rays of 2021. Impression: These findings are consistent with inflammatory arthritis and osteoarthritis overlap.  XR Hand 2 View Left  Result Date: 12/02/2021 Juxta-articular osteopenia was noted.  CMC narrowing and subluxation was noted.  No intercarpal or radiocarpal joint space narrowing was noted.  Narrowing of all MCP joints with erosive changes in second and third and fourth MCP joints was noted.  Severe PIP and DIP narrowing with ankylosis of first and second DIPs was noted.  Erosive changes were noted in the second and third PIP joints.  Erosive changes were noted in the first and second DIP joints.  No significant radiographic progression was noted when compared to the x-rays of 2021. Impression: These findings are consistent with severe erosive psoriatic arthritis.  XR Hand 2 View Right  Result Date: 12/02/2021 Juxta-articular osteopenia was noted.  Narrowing of all MCP joints was noted.  Erosive changes were noted in the first second and third MCPs.  CMC narrowing and subluxation was noted.  Severe PIP and DIP narrowing was noted.  Erosive changes were noted in the second, third PIP joints and first and second DIP joints.  Erosive changes were noted in the base of the second and third metacarpals.  Intercarpal and radiocarpal joint space narrowing was noted.  Radiographic progression was noted when compared to the x-rays of 2021. Impression: These findings are consistent with severe erosive arthritis with radiographic progression.   Recent Labs: Lab Results  Component Value Date   WBC 8.8 11/26/2021   HGB 13.8 11/26/2021   PLT 472 (H) 11/26/2021   NA 139 11/26/2021   K 4.5 11/26/2021   CL 102 11/26/2021   CO2 26 11/26/2021   GLUCOSE 102 (H) 11/26/2021   BUN 25 11/26/2021   CREATININE 0.74 11/26/2021   BILITOT 0.3 11/26/2021   ALKPHOS 75 10/16/2016   AST 17 11/26/2021   ALT 15 11/26/2021   PROT 7.6 11/26/2021    ALBUMIN 4.2 10/16/2016   CALCIUM 9.4 11/26/2021   GFRAA 114 09/18/2020   QFTBGOLDPLUS NEGATIVE 06/12/2021    Speciality Comments: TB Gold: 10/16/16 Neg Failed Cosentyx-developed rash that sent her to the ER Does not want to try TNF-inhibitor due to neurologic risk since she has epilepsy  Procedures:  No procedures performed Allergies: Cosentyx [secukinumab], Hydrocodone, Vimpat [lacosamide], Amoxicillin, Codeine, Keppra [levetiracetam], Sulfa antibiotics, and Transderm-scop [scopolamine]   Assessment / Plan:     Visit Diagnoses: Psoriatic arthritis ([place]) -Patient presents today with tenderness and synovitis of the right second and third PIP joints.  Incomplete fist formation noted bilaterally.  She has been experiencing recurrent flares involving multiple joints, most severe in her hands, right knee, and both feet.  She remains on Simponi 50 mg subcutaneous injections every 28 days and Otrexup 20 mg subcu injections once weekly.  She took a prednisone taper starting on 09/23/2021 which provided temporary relief but then the pain and swelling in her right hand returned.  X-rays of both hands and feet were obtained today to assess for radiographic progression.  I also obtained updated x-rays of her right knee.  She would like to hold off on reapplying for Visco gel injections at this time.  She has extensive psoriasis in the umbilical region, gluteal region, and behind both ears.  A refill of clobetasol cream was sent to the pharmacy today.  Discussed that she will benefit from switching from Simponi to [place].  Discussed that Donnetta Hail has been studied and show superiority for genital psoriasis including areas such  as the gluteal crease.  Donnetta Hail will be the best choice for management of her dental psoriasis as well as severe erosive psoriatic arthritis.  She is not a good candidate for Jak inhibitors or Otezla given her history of gastric bypass.  Indications, contraindications, potential side effects of Taltz  were discussed today in detail.  All questions were addressed and consent was obtained.  We will apply for Donnetta Hail to her insurance and once approved she will return to the office for administration of the first injection.  She will remain on Otrexup and folic acid as prescribed.  She will follow-up in the office in 6 to 8 weeks to assess her response.  Plan: XR Hand 2 View Left, XR Hand 2 View Right, XR Foot 2 Views Right, XR Foot 2 Views Left  Medication counseling:  Baseline Immunosuppressant Therapy Labs TB GOLD    Latest Ref Rng & Units 06/12/2021    1:51 PM  Quantiferon TB Gold  Quantiferon TB Gold Plus NEGATIVE NEGATIVE       SPEP    Latest Ref Rng & Units 11/26/2021    2:56 PM  Serum Protein Electrophoresis  Total Protein 6.1 - 8.1 g/dL 7.6    Hepatitis panel negative on 05/31/08.  SPEP-no M spike detected on 05/31/08.   Chest Xray: No acute findings on 06/06/08   Does patient have a history of inflammatory bowel disease? No  Counseled patient that Donnetta Hail is a IL-17 inhibitor that works to reduce pain and inflammation associated with arthritis.  Counseled patient on purpose, proper use, and adverse effects of Taltz. Reviewed the most common adverse effects of infection, inflammatory bowel disease, and allergic reaction. Counseled patient that Donnetta Hail should be held for infection and prior to scheduled surgery.  Counseled patient to avoid live vaccines while on Taltz.  Advised patient to get annual influenza vaccine, pneumococcal vaccine, and Shingrix as indicated.  Reviewed storage information for Taltz.  Reviewed the importance of regular labs while on [place]. Standing orders placed and is to return in 1 month and then every 3 months after initiation.  Provided patient with medication education material and answered all questions.  Patient consented to [place].  Will upload consent into patient's chart.  Will apply for Taltz through patient's insurance and update when we receive a response.   Advised initial injection must be administered in office.  Patient voiced understanding.    Taltz dose will be: For psoriatic arthritis and plaque psoriasis overlap load of 160 mg then 80 mg on weeks 2,4,6,8,10,12 then 80 mg every 28 days  Prescription will be sent to pharmacy pending lab results and insurance approval.  High risk medication use -Applying for Taltz through her insurance.  She will remain on Otrexup 20 mg sq injections once weekly and folic acid 2 mg daily.   Inadequate response to Simponi.  Discontinued Cosentyx due to rash.  Not a good candidate for Enbrel or Humira due to history of epilepsy. CBC and CMP were drawn on 09/08/2021.  Orders for CBC and CMP were released today.  She will require updated lab work 1 month and every 3 months after initiating [place].  Standing orders for CBC and CMP remain in place. TB Gold negative on 06/12/2021. Discussed the importance of holding Taltz and Otrexup if she develops signs or symptoms of an infection and to resume once the infection has completely cleared. - Plan: CBC with Differential/Platelet, COMPLETE METABOLIC PANEL WITH GFR  Psoriasis: She has extensive psoriasis in the gluteal  region, umbilical region, and behind both ears despite remaining on Simponi and Otrexup as prescribed.  She has been using clobetasol cream topically as needed which alleviates the scaling and itching but the patches do not resolve.  She will benefit from switching to [place] as discussed above.  She will start on the loading dose followed by the maintenance dose of Taltz. A refill of clobetasol cream will be sent to the pharmacy.   Pain in both hands: X-rays of both hands were obtained on 10/23/2019 and were consistent with erosive inflammatory arthritis and osteoarthritis overlap.  X-rays of both hands were updated today to assess for radiographic progression.  She has been experiencing increased pain and stiffness in both hands.  She has incomplete fist formation  bilaterally.  She has tenderness and synovitis of the right second and third PIP joints.  Piriformis syndrome, left: Patient presents today with discomfort in the left buttocks consistent with piriformis syndrome.  She was given a handout of exercises to perform.  Chronic pain of right knee -X-rays of the right knee were updated today.  She is been experiencing increased pain and stiffness in the right knee joint.  On examination she has painful range of motion with some warmth but no effusion.  She has tried Visco gel injections in the past which provided only temporary relief.  She does not want to proceed with repeat Visco gel injections at this time.  Plan: XR KNEE 3 VIEW RIGHT  Primary osteoarthritis of both feet: X-rays of both feet revealed osteoarthritic changes on 10/23/2019.  X-rays of both feet were updated today.  HLA B27 positive  Skin yeast infection: Bilateral inframammary yeast infection as well as in the inguinal region.  Patient requested a refill of Diflucan which has been effective at clearing the yeast infections in the past.  A prescription for Diflucan will be sent to the pharmacy today.  She was advised to notify us if these yeast infections become recurrent specially after initiating [place].  She should hold Otrexup until the yeast infection has completely resolved and she has completed the course of Diflucan.  History of gastric bypass: Patient has had a history of gastric bypass so she is not a good candidate for Otezla or Jak inhibitors.  Other medical conditions are listed as follows:   History of sleep apnea  History of asthma  History of abdominal hernia  History of epilepsy  History of cholecystectomy  History of hernia repair  Family history of rheumatoid arthritis  Orders: Orders Placed This Encounter  Procedures   XR Hand 2 View Left   XR Hand 2 View Right   XR Foot 2 Views Right   XR Foot 2 Views Left   XR KNEE 3 VIEW RIGHT   CBC with  Differential/Platelet   COMPLETE METABOLIC PANEL WITH GFR   Serum protein electrophoresis with reflex   IgG, IgA, IgM   HIV Antibody (routine testing w rflx)   No orders of the defined types were placed in this encounter.   Follow-Up Instructions: Return in about 8 weeks (around 01/21/2022) for Psoriatic arthritis.   [name], PA-C  Note - This record has been created using Dragon software.  Chart creation errors have been sought, but may not always  have been located. Such creation errors do not reflect on  the standard of medical care.

## 2021-11-24 ENCOUNTER — Ambulatory Visit: Payer: BC Managed Care – PPO | Admitting: Physician Assistant

## 2021-11-24 DIAGNOSIS — Z8709 Personal history of other diseases of the respiratory system: Secondary | ICD-10-CM

## 2021-11-24 DIAGNOSIS — L4050 Arthropathic psoriasis, unspecified: Secondary | ICD-10-CM

## 2021-11-24 DIAGNOSIS — Z8669 Personal history of other diseases of the nervous system and sense organs: Secondary | ICD-10-CM

## 2021-11-24 DIAGNOSIS — Z8719 Personal history of other diseases of the digestive system: Secondary | ICD-10-CM

## 2021-11-24 DIAGNOSIS — M79641 Pain in right hand: Secondary | ICD-10-CM

## 2021-11-24 DIAGNOSIS — L409 Psoriasis, unspecified: Secondary | ICD-10-CM

## 2021-11-24 DIAGNOSIS — Z9884 Bariatric surgery status: Secondary | ICD-10-CM

## 2021-11-24 DIAGNOSIS — Z79899 Other long term (current) drug therapy: Secondary | ICD-10-CM

## 2021-11-24 DIAGNOSIS — Z1589 Genetic susceptibility to other disease: Secondary | ICD-10-CM

## 2021-11-24 DIAGNOSIS — Z9049 Acquired absence of other specified parts of digestive tract: Secondary | ICD-10-CM

## 2021-11-24 DIAGNOSIS — Z8261 Family history of arthritis: Secondary | ICD-10-CM

## 2021-11-26 ENCOUNTER — Ambulatory Visit (INDEPENDENT_AMBULATORY_CARE_PROVIDER_SITE_OTHER): Payer: BC Managed Care – PPO

## 2021-11-26 ENCOUNTER — Encounter: Payer: Self-pay | Admitting: Physician Assistant

## 2021-11-26 ENCOUNTER — Telehealth: Payer: Self-pay | Admitting: Pharmacist

## 2021-11-26 ENCOUNTER — Other Ambulatory Visit: Payer: Self-pay

## 2021-11-26 ENCOUNTER — Ambulatory Visit: Payer: BC Managed Care – PPO | Attending: Physician Assistant | Admitting: Physician Assistant

## 2021-11-26 VITALS — BP 127/84 | HR 82 | Resp 15 | Ht 62.0 in | Wt 266.0 lb

## 2021-11-26 DIAGNOSIS — G5702 Lesion of sciatic nerve, left lower limb: Secondary | ICD-10-CM

## 2021-11-26 DIAGNOSIS — L409 Psoriasis, unspecified: Secondary | ICD-10-CM | POA: Diagnosis not present

## 2021-11-26 DIAGNOSIS — G8929 Other chronic pain: Secondary | ICD-10-CM

## 2021-11-26 DIAGNOSIS — M79671 Pain in right foot: Secondary | ICD-10-CM | POA: Diagnosis not present

## 2021-11-26 DIAGNOSIS — M79642 Pain in left hand: Secondary | ICD-10-CM | POA: Diagnosis not present

## 2021-11-26 DIAGNOSIS — M19071 Primary osteoarthritis, right ankle and foot: Secondary | ICD-10-CM

## 2021-11-26 DIAGNOSIS — Z9884 Bariatric surgery status: Secondary | ICD-10-CM

## 2021-11-26 DIAGNOSIS — Z8709 Personal history of other diseases of the respiratory system: Secondary | ICD-10-CM

## 2021-11-26 DIAGNOSIS — M79641 Pain in right hand: Secondary | ICD-10-CM

## 2021-11-26 DIAGNOSIS — Z1589 Genetic susceptibility to other disease: Secondary | ICD-10-CM

## 2021-11-26 DIAGNOSIS — Z8669 Personal history of other diseases of the nervous system and sense organs: Secondary | ICD-10-CM

## 2021-11-26 DIAGNOSIS — M25561 Pain in right knee: Secondary | ICD-10-CM

## 2021-11-26 DIAGNOSIS — L4050 Arthropathic psoriasis, unspecified: Secondary | ICD-10-CM | POA: Diagnosis not present

## 2021-11-26 DIAGNOSIS — Z9889 Other specified postprocedural states: Secondary | ICD-10-CM

## 2021-11-26 DIAGNOSIS — Z79899 Other long term (current) drug therapy: Secondary | ICD-10-CM

## 2021-11-26 DIAGNOSIS — Z9049 Acquired absence of other specified parts of digestive tract: Secondary | ICD-10-CM

## 2021-11-26 DIAGNOSIS — M79672 Pain in left foot: Secondary | ICD-10-CM | POA: Diagnosis not present

## 2021-11-26 DIAGNOSIS — M19072 Primary osteoarthritis, left ankle and foot: Secondary | ICD-10-CM

## 2021-11-26 DIAGNOSIS — Z8719 Personal history of other diseases of the digestive system: Secondary | ICD-10-CM

## 2021-11-26 DIAGNOSIS — B372 Candidiasis of skin and nail: Secondary | ICD-10-CM

## 2021-11-26 DIAGNOSIS — Z8261 Family history of arthritis: Secondary | ICD-10-CM

## 2021-11-26 MED ORDER — CLOBETASOL PROPIONATE 0.05 % EX CREA: 1.0000 | TOPICAL_CREAM | Freq: Two times a day (BID) | CUTANEOUS | 0 refills | Status: DC

## 2021-11-26 MED ORDER — FLUCONAZOLE 150 MG PO TABS: ORAL_TABLET | ORAL | 0 refills | Status: AC

## 2021-11-26 NOTE — Telephone Encounter (Addendum)
Please start [place].  Dose: '160mg'$  SQ at Week 0, then '80mg'$  at Weeks 2, 4, 6, 8, 10, and 12  Dx: Psoriatic arthritis (L40.5) and Psoriasis (L40.9)  Previously tried therapies: Cosentyx - severe rash  Therapies patient unable to try: TNF inhibitor (Humira, Simponi, Enbrel, Cimzia) - neurologic risk secondary to epilepsy  Current regimen: MTX + Simponi (waning clinical response)  Once approved, patient will need to be enrolled into copay card  Knox Saliva, PharmD, MPH, BCPS, CPP Clinical Pharmacist (Rheumatology and Pulmonology)  ----- Message from [place] sent at 11/26/2021  2:48 PM EDT ----- Please apply for taltz, per Hazel Sams, PA-C. Consent obtained and sent to the scan center. Thanks!

## 2021-11-26 NOTE — Addendum Note (Signed)
Addended by: [name] on: 11/26/2021 04:12 PM   Modules accepted: Orders

## 2021-11-26 NOTE — Telephone Encounter (Signed)
Please review and sign pended refills for diflucan and clobetasol cream. Also advised patient to hold MTX until yeast resolves. Thanks!

## 2021-11-26 NOTE — Patient Instructions (Addendum)
Standing Labs We placed an order today for your standing lab work.   Please have your standing labs drawn in December and every 3 months   If possible, please have your labs drawn 2 weeks prior to your appointment so that the provider can discuss your results at your appointment.  Please note that you may see your imaging and lab results in [facility] before we have reviewed them. We may be awaiting multiple results to interpret others before contacting you. Please allow our office up to 72 hours to thoroughly review all of the results before contacting the office for clarification of your results.  We currently have open lab daily: Monday through Thursday from 1:30 PM-4:30 PM and Friday from 1:30 PM- 4:00 PM If possible, please come for your lab work on Monday, Thursday or Friday afternoons, as you may experience shorter wait times.   Effective January 14, 2022 the new lab hours will change to: Monday through Thursday from 1:30 PM-5:00 PM and Friday from 8:30 AM-12:00 PM If possible, please come for your lab work on Monday and Thursday afternoons, as you may experience shorter wait times.  Please be advised, all patients with office appointments requiring lab work will take precedent over walk-in lab work.    The office is located at [address] [place], [place], [place] [address] No appointment is necessary.   Labs are drawn by [facility]. Please bring your co-pay at the time of your lab draw.  You may receive a bill from [facility] for your lab work.  Please note if you are on Hydroxychloroquine and and an order has been placed for a Hydroxychloroquine level, you will need to have it drawn 4 hours or more after your last dose.  If you wish to have your labs drawn at another location, please call the office 24 hours in advance to send orders.  If you have any questions regarding directions or hours of operation,  please call [phone].   As a reminder, please drink plenty of water prior  to coming for your lab work. Thanks!   Piriformis Syndrome Rehab Ask your health care provider which exercises are safe for you. Do exercises exactly as told by your health care provider and adjust them as directed. It is normal to feel mild stretching, pulling, tightness, or discomfort as you do these exercises. Stop right away if you feel sudden pain or your pain gets worse. Do not begin these exercises until told by your health care provider. Stretching and range-of-motion exercises These exercises warm up your muscles and joints and improve the movement and flexibility of your hip and pelvis. The exercises also help to relieve pain, numbness, and tingling. Nerve root  Sit on a firm surface that is high enough that you can swing your left / right foot freely. Place a folded towel under your left / right thigh. This is optional. Drop your head forward and round your back. While you keep your left / right foot relaxed, slowly straighten your left / right knee until you feel a slight pull behind your knee or calf. If your leg is fully extended and you still do not feel a pull, slowly tilt your foot and toes toward you. Hold this position for __________ seconds. Slowly return your knee to its starting position. Hip rotation This is an exercise in which you lie on your back and stretch the muscles that rotate your hip (hip rotators) to stretch your buttocks. Lie on your back on a  firm surface. Pull your left / right knee toward your same shoulder with your left / right hand until your knee is pointing toward the ceiling. Hold your left / right ankle with your other hand. Keeping your knee steady, gently pull your left / right ankle toward your other shoulder until you feel a stretch in your buttocks. Hold this position for __________ seconds. Repeat __________ times. Complete this exercise __________ times a day. Hip extensor This is an exercise in which you lie on your back and pull your knee  to your chest. Lie on your back on a firm surface. Both of your legs should be straight. Pull your left / right knee to your chest. Hold your leg in this position by holding on to the back of your thigh or the front of your knee. Hold this position for __________ seconds. Slowly return to the starting position. Repeat __________ times. Complete this exercise __________ times a day. Strengthening exercises These exercises build strength and endurance in your hip and thigh muscles. Endurance is the ability to use your muscles for a long time, even after they get tired. Straight leg raises, side-lying This exercise strengthens the muscles that rotate the leg at the hip and move it away from your body (hip abductors). Lie on your side with your left / right leg in the top position. Lie so your head, shoulder, knee, and hip line up. Bend your bottom knee to help you balance. Lift your top leg 4-6 inches (10-15 cm) while keeping your toes pointed straight ahead. Hold this position for __________ seconds. Slowly lower your leg to the starting position. Let your muscles relax completely after each repetition. Repeat __________ times. Complete this exercise __________ times a day. Hip abduction and rotation This is sometimes called quadruped (on hands and knees) exercises. Get on your hands and knees on a firm, lightly padded surface. Your hands should be directly below your shoulders, and your knees should be directly below your hips. Lift your left / right knee out to the side. Keep your knee bent. Do not twist your body. Hold this position for __________ seconds. Slowly lower your leg. Repeat __________ times. Complete this exercise __________ times a day. Straight leg raises, prone This exercise stretches the muscles that move the hips (hip extensors). Lie on your abdomen on a firm surface (prone position). Tense the muscles in your buttocks and lift your left / right leg about 4 inches (10 cm).  Keep your knee straight as you lift your leg. If you cannot lift your leg that high without arching your back, place a pillow under your hips. Hold this position for __________ seconds. Slowly lower your leg to the starting position. Let your muscles relax completely after each repetition. Repeat __________ times. Complete this exercise __________ times a day. This information is not intended to replace advice given to you by your health care provider. Make sure you discuss any questions you have with your health care provider. Document Revised: 09/03/2020 Document Reviewed: 09/03/2020 Elsevier Patient Education  [place] Injection (TALTZ) What is this medication? IXEKIZUMAB (ix e KIZ ue mab) treats autoimmune conditions, such as psoriasis and arthritis. It works by slowing down an overactive immune system. It is a monoclonal antibody. This medicine may be used for other purposes; ask your health care provider or pharmacist if you have questions. COMMON BRAND NAME(S): TALTZ What should I tell my care team before I take this medication? They need  to know if you have any of these conditions: Immune system problems Infection, such as viral infection, chickenpox, cold sores, or herpes Recently received or are scheduled to receive a vaccine Tuberculosis, a positive skin test for tuberculosis, or recent close contact with someone who has tuberculosis An unusual or allergic reaction to ixekizumab, other medications, foods, dyes, or preservatives Pregnant or trying to get pregnant Breast-feeding How should I use this medication? This medication is injected under the skin. It is usually given by your care team in a hospital or clinic setting. It may also be given at home. If you get this medication at home, you will be taught how to prepare and give it. Use exactly as directed. Take it as directed on the prescription label at the same time every day. Keep taking it unless  your care team tells you to stop. It is important that you put your used needles and syringes in a special sharps container. Do not put them in a trash can. If you do not have a sharps container, call your pharmacist or care team to get one. A special MedGuide will be given to you by the pharmacist with each prescription and refill. If you are getting this medication in a hospital or clinic, a special MedGuide will be given to you before each treatment. Be sure to read this information carefully each time. Talk to your care team about the use of this medication in children. While it be prescribed for children as young as 6 years for selected conditions, precautions do apply. Overdosage: If you think you have taken too much of this medicine contact a poison control center or emergency room at once. NOTE: This medicine is only for you. Do not share this medicine with others. What if I miss a dose? If you get this medication at the hospital or clinic: It is important not to miss your dose. Call your care team if you are unable to keep an appointment. If you give yourself the medication at home: If you miss a dose, take it as soon as you can. Then continue your normal schedule. Do not take double or extra doses. Call your care team with questions. What may interact with this medication? Do not take this medication with any of the following: Live virus vaccines This medication may also interact with the following: Inactivated vaccines This list may not describe all possible interactions. Give your health care provider a list of all the medicines, herbs, non-prescription drugs, or dietary supplements you use. Also tell them if you smoke, drink alcohol, or use illegal drugs. Some items may interact with your medicine. What should I watch for while using this medication? Visit your care team for regular checks on your progress. Tell your care team if your symptoms do not start to get better or if they get  worse. You will be tested for tuberculosis (TB) before you start this medication. If your care team prescribes any medication for TB, you should start taking the TB medication before starting this medication. Make sure to finish the full course of TB medication. This medication may increase your risk of getting an infection. Call your care team for advice if you get a fever, chills, sore throat, or other symptoms of a cold or flu. Do not treat yourself. Try to avoid being around people who are sick. This medication can decrease the response to a vaccine. If you need to get vaccinated, tell your care team if you have received this  medication within the last 6 months. Extra booster doses may be needed. Talk to your care team to see if a different vaccination schedule is needed. What side effects may I notice from receiving this medication? Side effects that you should report to your care team as soon as possible: Allergic reactions--skin rash, itching, hives, swelling of the face, lips, tongue, or throat Infection--fever, chills, cough, sore throat, wounds that don't heal, pain or trouble when passing urine, general feeling of discomfort or being unwell Sudden or severe stomach pain, bloody diarrhea, fever, nausea, vomiting Side effects that usually do not require medical attention (report to your care team if they continue or are bothersome): Nausea Pain, redness, or irritation at injection site Runny or stuffy nose Sore throat This list may not describe all possible side effects. Call your doctor for medical advice about side effects. You may report side effects to FDA at 1-800-FDA-1088. Where should I keep my medication? Keep out of the reach of children and pets. Store in the refrigerator. Do not freeze. Do not shake. Keep this medication in the original container. Protect from light. Use the dose within 30 minutes of removing it from the refrigerator. Get rid of any unused medication after the  expiration date. To get rid of medications that are no longer needed or have expired: Take the medication to a medication take-back program. Check with your pharmacy or law enforcement to find a location. If you cannot return the medication, ask your pharmacist or care team how to get rid of this medication safely. NOTE: This sheet is a summary. It may not cover all possible information. If you have questions about this medicine, talk to your doctor, pharmacist, or health care provider.  2023 Elsevier/Gold Standard (2021-05-14 00:00:00)

## 2021-11-27 LAB — COMPLETE METABOLIC PANEL WITH GFR
AG Ratio: 1.2 (calc) (ref 1.0–2.5)
ALT: 15 U/L (ref 6–29)
AST: 17 U/L (ref 10–35)
Albumin: 4.2 g/dL (ref 3.6–5.1)
Alkaline phosphatase (APISO): 83 U/L (ref 37–153)
BUN: 25 mg/dL (ref 7–25)
CO2: 26 mmol/L (ref 20–32)
Calcium: 9.4 mg/dL (ref 8.6–10.4)
Chloride: 102 mmol/L (ref 98–110)
Creat: 0.74 mg/dL (ref 0.50–1.03)
Globulin: 3.4 g/dL (calc) (ref 1.9–3.7)
Glucose, Bld: 102 mg/dL — ABNORMAL HIGH (ref 65–99)
Potassium: 4.5 mmol/L (ref 3.5–5.3)
Sodium: 139 mmol/L (ref 135–146)
Total Bilirubin: 0.3 mg/dL (ref 0.2–1.2)
Total Protein: 7.6 g/dL (ref 6.1–8.1)
eGFR: 95 mL/min/{1.73_m2} (ref >=60)

## 2021-11-27 LAB — CBC WITH DIFFERENTIAL/PLATELET
Absolute Monocytes: 818 cells/uL (ref 200–950)
Basophils Absolute: 62 cells/uL (ref 0–200)
Basophils Relative: 0.7 %
Eosinophils Absolute: 70 cells/uL (ref 15–500)
Eosinophils Relative: 0.8 %
HCT: 41.7 % (ref 35.0–45.0)
Hemoglobin: 13.8 g/dL (ref 11.7–15.5)
Lymphs Abs: 3062 cells/uL (ref 850–3900)
MCH: 28.9 pg (ref 27.0–33.0)
MCHC: 33.1 g/dL (ref 32.0–36.0)
MCV: 87.4 fL (ref 80.0–100.0)
MPV: 8.8 fL (ref 7.5–12.5)
Monocytes Relative: 9.3 %
Neutro Abs: 4787 cells/uL (ref 1500–7800)
Neutrophils Relative %: 54.4 %
Platelets: 472 10*3/uL — ABNORMAL HIGH (ref 140–400)
RBC: 4.77 10*6/uL (ref 3.80–5.10)
RDW: 14.5 % (ref 11.0–15.0)
Total Lymphocyte: 34.8 %
WBC: 8.8 10*3/uL (ref 3.8–10.8)

## 2021-11-27 NOTE — Progress Notes (Signed)
Glucose is 102. Rest of CMP WNL. Plt count is elevated-could be due to chronic inflammation.  Rest of CBC WNL.

## 2021-11-28 NOTE — Telephone Encounter (Signed)
Submitted a Prior Authorization request to [facility] for TALTZ via CoverMyMeds. Will update once we receive a response.  Key: [id number]  Knox Saliva, PharmD, MPH, BCPS, CPP Clinical Pharmacist (Rheumatology and Pulmonology)

## 2021-12-01 NOTE — Telephone Encounter (Signed)
Received a fax regarding Prior Authorization from [facility] for Tiffany Wilkins. Authorization has been DENIED because must have tried at least two of the following for at least 3 months or could not tolerate at lease two of the following: Cosentyx, Enbrel, adalimumab, Rutherford Nail, Rinvoq, Skyrizi, [place], [place], [place] OR medical reason why patient cannot try ALL of the above   Will have to submit appeal   Knox Saliva, PharmD, MPH, BCPS, CPP Clinical Pharmacist (Rheumatology and Pulmonology)

## 2021-12-02 NOTE — Progress Notes (Signed)
Reviewed x-rays with the patient today in the office.  Planning to apply for [place].

## 2021-12-02 NOTE — Telephone Encounter (Signed)
Spoke with patient regarding Taltz denial. She states that she is unable to take TNF inhbitors due to history of epilepsy.  She states that she has only previously taken Cosentyx but never started [place].  She is unable to take oral treatments options like R[place]ford Nail, Rinvoq, [place], and Olumiant due to history of gastric sleeve surgery. There is inadequate pharmacokinetic and efficacy data for these medications in patients with history of gastric bypass surgery.  Larinda Buttery, and [place]fya would not be as effective as Materials engineer for patient's active and aggressive gluteal psoriasis  Knox Saliva, PharmD, MPH, BCPS, CPP Clinical Pharmacist (Rheumatology and Pulmonology)

## 2021-12-02 NOTE — Progress Notes (Signed)
Reviewed x-rays with the patient today in the office.

## 2021-12-09 NOTE — Telephone Encounter (Signed)
Since appeal can take up to 30 days to reach resolution, will proceed with Taltz Together bridge program. Pending provider portal processing at this time. Spoke with patient regarding this next step. She verbalized understanding  Knox Saliva, PharmD, MPH, BCPS, CPP Clinical Pharmacist (Rheumatology and Pulmonology)

## 2021-12-09 NOTE — Telephone Encounter (Addendum)
Submitted an URGENT appeal to [facility] for [place] via [place]. Attempted to fax multiple times but says line is busy.  Reference # [id number] Phone: [phone] Fax: [phone]  Knox Saliva, PharmD, MPH, BCPS, CPP Clinical Pharmacist (Rheumatology and Pulmonology)

## 2021-12-10 NOTE — Telephone Encounter (Signed)
Completed Taltz Together enrollment on provider portal. Patient portion emailed to patient from portal.  Signed provider form faxed to [employer] with insurance card copy, appeal letter, and denial letter for bridge program enrollment  Fax: [phone] Phone: [phone]  Knox Saliva, PharmD, MPH, BCPS, CPP Clinical Pharmacist (Rheumatology and Pulmonology)

## 2021-12-15 ENCOUNTER — Other Ambulatory Visit: Payer: Self-pay | Admitting: Rheumatology

## 2021-12-15 DIAGNOSIS — L4050 Arthropathic psoriasis, unspecified: Secondary | ICD-10-CM

## 2021-12-15 NOTE — Telephone Encounter (Signed)
Received call from [place] rep requesting patient's consent to help her be enrolled into bridge program. I called patient. She completed consent while on phone with me. Will await follow-up from [place] regarding [place]  Knox Saliva, PharmD, MPH, BCPS, CPP Clinical Pharmacist (Rheumatology and Pulmonology)

## 2021-12-16 ENCOUNTER — Telehealth: Payer: Self-pay | Admitting: Adult Health

## 2021-12-16 NOTE — Telephone Encounter (Signed)
Informed pt of r/s for 5/1 appt- NP out.

## 2021-12-17 NOTE — Telephone Encounter (Signed)
Received fax from [employer] regarding patient's [employer] program enrollment. [place] is filling Materials engineer. They will reach out to patient to complete enrollment into $25 savings card program  Pharmacy phone [phone]  I spoke with patient. She states she spoke with [facility] yesterday. Advised that pharmacy will be reaching out to schedule shipment. She took her last Simponi dose on 12/14/21 so will not be able to start [place] until 01/12/22. Recommended she still schedule Taltz shipment to her home and reach out to me so that we can then schedule Taltz new start visit in clinic  Knox Saliva, PharmD, MPH, BCPS, CPP Clinical Pharmacist (Rheumatology and Pulmonology)

## 2022-01-01 NOTE — Telephone Encounter (Signed)
ATC patient to determine if she received shipment of Taltz from [employer] so we can schedule new start visit for on or after 01/12/22. Unable to reach. Left VM requesting return call  Knox Saliva, PharmD, MPH, BCPS, CPP Clinical Pharmacist (Rheumatology and Pulmonology)

## 2022-01-05 NOTE — Telephone Encounter (Signed)
Patient states she received Taltz shipment last week. Last Simponi dose was on 12/14/21. Taltz new start appt scheduled for 01/14/22  Knox Saliva, PharmD, MPH, BCPS, CPP Clinical Pharmacist (Rheumatology and Pulmonology)

## 2022-01-06 NOTE — Progress Notes (Deleted)
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 Her last dose of Simponi was on 12/14/2021.  Patient running a fever or have signs/symptoms of infection? {yes/no:20286}  Patient currently on antibiotics for the treatment of infection? {yes/no:20286}  Patient have any upcoming invasive procedures/surgeries? {yes/no:20286}  Objective: CMP     Component Value Date/Time   NA 139 11/26/2021 1456   NA 140 03/18/2016 1531   K 4.5 11/26/2021 1456   CL 102 11/26/2021 1456   CO2 26 11/26/2021 1456   GLUCOSE 102 (H) 11/26/2021 1456   BUN 25 11/26/2021 1456   BUN 17 03/18/2016 1531   CREATININE 0.74 11/26/2021 1456   CALCIUM 9.4 11/26/2021 1456   PROT 7.6 11/26/2021 1456   PROT 7.4 03/18/2016 1531   ALBUMIN 4.2 10/16/2016 1343   ALBUMIN 4.5 03/18/2016 1531   AST 17 11/26/2021 1456   ALT 15 11/26/2021 1456   ALKPHOS 75 10/16/2016 1343   BILITOT 0.3 11/26/2021 1456   BILITOT 0.2 03/18/2016 1531   GFRNONAA 99 09/18/2020 1132   GFRAA 114 09/18/2020 1132    CBC    Component Value Date/Time   WBC 8.8 11/26/2021 1456   RBC 4.77 11/26/2021 1456   HGB 13.8 11/26/2021 1456   HGB 15.7 03/18/2016 1531   HCT 41.7 11/26/2021 1456   HCT 46.0 03/18/2016 1531   PLT 472 (H) 11/26/2021 1456   PLT 278 03/18/2016 1531   MCV 87.4 11/26/2021 1456   MCV 89 03/18/2016 1531   MCH 28.9 11/26/2021 1456   MCHC 33.1 11/26/2021 1456   RDW 14.5 11/26/2021 1456   RDW 15.2 03/18/2016 1531   LYMPHSABS 3,062 11/26/2021 1456   LYMPHSABS 2.7 03/18/2016 1531   MONOABS 893 10/16/2016 1343   EOSABS 70 11/26/2021 1456   EOSABS 0.1 03/18/2016 1531   BASOSABS 62 11/26/2021 1456   BASOSABS 0.0 03/18/2016 1531    Baseline Immunosuppressant Therapy Labs TB GOLD    Latest Ref Rng & Units 06/12/2021    1:51 PM  Quantiferon TB Gold  Quantiferon TB Gold Plus NEGATIVE NEGATIVE    Hepatitis Panel (05/31/2008) Hepatitis B  surface antigen - negative Hepatitis B core Ab, IgM - negative Hepatitis A antibody, IgM - negative Hepatitis C antibody - negative  HIV (06/14/2013) HIV 1/O/2 Abs - < 1.00 HIV-1/HIV-2 Ab - no reactive  Immunoglobulins    SPEP (05/31/2008) in media tab    Latest Ref Rng & Units 11/26/2021    2:56 PM  Serum Protein Electrophoresis  Total Protein 6.1 - 8.1 g/dL 7.6    G6PD No results found for: "G6PDH" TPMT No results found for: "TPMT"   Chest x-ray: 06/06/2008 - No acute findings  Assessment/Plan:  Reviewed importance of holding TALTZ with signs/symptoms of an infections, if antibiotics are prescribed to treat an active infection, and with invasive procedures  Demonstrated proper injection technique with TALTZ demo device  Patient able to demonstrate proper injection technique using the teach back method.  Patient self injected in the {injsitedsg:28167} with:  Sample Medication: *** NDC: *** Lot: *** Expiration: ***  Patient tolerated well.  Observed for 30 mins in office for adverse reaction and ***.   Patient is to return in 1 month for labs and 6-8 weeks for follow-up appointment.  Standing orders placed.   TALTZ approved through [employer] while work on [employer] .   Rx sent to:  [facility] .  Patient has already received shipment at home.  Patient will continue *** every *** days in combination with ***.  All questions encouraged and answered.  Instructed patient to call with any further questions or concerns.  Knox Saliva, PharmD, MPH, BCPS, CPP Clinical Pharmacist (Rheumatology and Pulmonology)  01/06/2022 11:50 AM

## 2022-01-13 ENCOUNTER — Telehealth: Payer: Self-pay | Admitting: *Deleted

## 2022-01-13 DIAGNOSIS — R051 Acute cough: Secondary | ICD-10-CM | POA: Diagnosis not present

## 2022-01-13 DIAGNOSIS — R519 Headache, unspecified: Secondary | ICD-10-CM | POA: Diagnosis not present

## 2022-01-13 DIAGNOSIS — R6883 Chills (without fever): Secondary | ICD-10-CM | POA: Diagnosis not present

## 2022-01-13 DIAGNOSIS — R062 Wheezing: Secondary | ICD-10-CM | POA: Diagnosis not present

## 2022-01-13 DIAGNOSIS — J159 Unspecified bacterial pneumonia: Secondary | ICD-10-CM | POA: Diagnosis not present

## 2022-01-13 DIAGNOSIS — R0602 Shortness of breath: Secondary | ICD-10-CM | POA: Diagnosis not present

## 2022-01-13 NOTE — Telephone Encounter (Signed)
Patient diagnosed with pneumonia. Will r/s new start visit. Will plan to reach out to patient next week  Knox Saliva, PharmD, MPH, BCPS, CPP Clinical Pharmacist (Rheumatology and Pulmonology)

## 2022-01-13 NOTE — Telephone Encounter (Addendum)
Noted. Will r/s new start after reaching out to patient next week  Knox Saliva, PharmD, MPH, BCPS, CPP Clinical Pharmacist (Rheumatology and Pulmonology)

## 2022-01-13 NOTE — Telephone Encounter (Signed)
Patient contacted the office stating she had an appointment scheduled for 01/14/2022 for anew start. Patient has cancelled her appointment. She has been diagnosed with Pneumonia.

## 2022-01-13 NOTE — Progress Notes (Deleted)
Office Visit Note  Patient: Tiffany Wilkins             Date of Birth: 1965/07/25           MRN: [id number]             PCP: Kathe Becton, DO Referring: Kathe Becton, DO Visit Date: 01/27/2022 Occupation: '@GUAROCC'$ @  Subjective:  Medication monitoring  History of Present Illness: Tiffany Wilkins is a 56 y.o. female with history of psoriatic arthritis.  Recently treated for pneumonia. Patient has not yet started on [place] of Daily Living:  Patient reports morning stiffness for *** {minute/hour:19697}.   Patient {ACTIONS;DENIES/REPORTS:21021675::"Denies"} nocturnal pain.  Difficulty dressing/grooming: {ACTIONS;DENIES/REPORTS:21021675::"Denies"} Difficulty climbing stairs: {ACTIONS;DENIES/REPORTS:21021675::"Denies"} Difficulty getting out of chair: {ACTIONS;DENIES/REPORTS:21021675::"Denies"} Difficulty using hands for taps, buttons, cutlery, and/or writing: {ACTIONS;DENIES/REPORTS:21021675::"Denies"}  No Rheumatology ROS completed.   PMFS History:  Patient Active Problem List   Diagnosis Date Noted   Primary osteoarthritis of both knees 12/11/2016   High risk medication use 06/30/2016   Right hand pain 06/30/2016   Right hip pain 06/30/2016   Hepatic steatosis 06/27/2014   BMI 50.0-59.9, adult ([place]) 06/27/2014   Fibroid, uterine 06/27/2014   Abdominal pain, chronic, right lower quadrant 06/27/2014   Obesity hypoventilation syndrome ([place]) 12/08/2013   Nocturnal seizures ([place]) 12/08/2013   OSA (obstructive sleep apnea) 12/08/2013   Noncompliance with CPAP treatment 12/08/2013   Psoriasis 11/29/2013   Seizure disorder, complex partial ([place]) 06/01/2012   Migraine 06/01/2012   OSA on CPAP 01/04/2012   Hiatal hernia 03/26/2011   Internal hemorrhoid 03/24/2011   Reflux esophagitis 03/24/2011   Psoriatic arthritis ([place]) 01/22/2011   Hernia of abdominal wall 01/22/2011    Past Medical History:  Diagnosis Date   Allergic rhinitis, cause unspecified    Anal  fissure    Arthritis    inflammatory and psoriatic--Dr. Estanislado Pandy   Asthma    related to allergies and colds   Chronic bronchitis    gets bronchitis yearly with colds   Chronic kidney disease kidney stones   Fracture    stress fracture right foot   GERD (gastroesophageal reflux disease)    Hernia (acquired) (recurrent)    R lower abdomen; has seen [facility] (Dr. Zena Amos and recurred   Hiatal hernia 03/2011   Impaired fasting glucose    Internal hemorrhoids 03/2011   Menstrual migraine    Dr, Jannifer Franklin   Obesity    Reflux esophagitis 03/2011   Seizure disorder ([place])    f/b Dr. Jannifer Franklin   Seizures [facility]) last seisure june 2012   Sleep apnea    inconclusive test per pt (some central and obstructive component)   Sleep apnea 06/07/09   mild complex sleep apnea, worse in supine position (uses CPAP intermittently only)   Tinea cruris 7/09    Family History  Problem Relation Age of Onset   Arthritis Mother        rheumatoid   Hyperlipidemia Mother    Hypertension Mother    Hyperthyroidism Mother        s/p thyroidectomy, now with hypothyroidism   Cancer Father 21       AML, lung cancer, kidney cancer (all presented at same time)   Diabetes Father    Hypertension Father    Hyperlipidemia Father    Psoriasis Father    Anxiety disorder Father        OCD, nervous breakdown   Psoriasis Sister    Irritable bowel syndrome Sister  Arthritis Sister        psoriatic   Diabetes Sister    Stroke Maternal Aunt    Diabetes Paternal Uncle    Crohn's disease Paternal Uncle    Cancer Paternal Uncle        ? type   Colitis Paternal Uncle    Asthma Maternal Grandmother    Hyperlipidemia Maternal Grandmother    Heart disease Maternal Grandmother    Stroke Maternal Grandmother    Diabetes Maternal Grandfather    Heart disease Maternal Grandfather    Cancer Paternal Grandmother 57       female (?ovarian)   Diabetes Paternal Grandmother    Hypertension Paternal Grandmother    Diabetes  Paternal Grandfather    Heart disease Paternal Grandfather    Hypertension Paternal Grandfather    GER disease Daughter    Asthma Son    Stroke Cousin    Turner syndrome Niece    Esophageal cancer Neg Hx    Stomach cancer Neg Hx    Past Surgical History:  Procedure Laterality Date   BARIATRIC SURGERY  09/22/2017   CESAREAN SECTION     x2   CHOLECYSTECTOMY  2005   COLONOSCOPY  03/19/11   Dr. Olevia Perches; internal hemorrhoids   ESOPHAGOGASTRODUODENOSCOPY  03/19/11   hiatal hernia, esophagitis   KIDNEY STONE SURGERY  08/2010   retrieved with basket (at [facility])   repair of incisional hernia  2005, 2008   related to laparoscopic cholecystectomy   Social History   Social History Narrative   Patient Lives with husband [name])  and 2 children (son and daughter, 20, 31)   Patient is right handed.   Patient drinks 2 cups daily- coffee/soda            Immunization History  Administered Date(s) Administered   Influenza Split 12/29/2010   Influenza,inj,Quad PF,6+ Mos 11/29/2013   Tdap 11/29/2013     Objective: Vital Signs: LMP 06/12/2014    Physical Exam Vitals and nursing note reviewed.  Constitutional:      Appearance: She is well-developed.  HENT:     Head: Normocephalic and atraumatic.  Eyes:     Conjunctiva/sclera: Conjunctivae normal.  Cardiovascular:     Rate and Rhythm: Normal rate and regular rhythm.     Heart sounds: Normal heart sounds.  Pulmonary:     Effort: Pulmonary effort is normal.     Breath sounds: Normal breath sounds.  Abdominal:     General: Bowel sounds are normal.     Palpations: Abdomen is soft.  Musculoskeletal:     Cervical back: Normal range of motion.  Skin:    General: Skin is warm and dry.     Capillary Refill: Capillary refill takes less than 2 seconds.  Neurological:     Mental Status: She is alert and oriented to person, place, and time.  Psychiatric:        Behavior: Behavior normal.      Musculoskeletal Exam: ***  CDAI  Exam: CDAI Score: -- Patient Global: --; Provider Global: -- Swollen: --; Tender: -- Joint Exam 01/27/2022   No joint exam has been documented for this visit   There is currently no information documented on the homunculus. Go to the Rheumatology activity and complete the homunculus joint exam.  Investigation: No additional findings.  Imaging: No results found.  Recent Labs: Lab Results  Component Value Date   WBC 8.8 11/26/2021   HGB 13.8 11/26/2021   PLT 472 (H) 11/26/2021  NA 139 11/26/2021   K 4.5 11/26/2021   CL 102 11/26/2021   CO2 26 11/26/2021   GLUCOSE 102 (H) 11/26/2021   BUN 25 11/26/2021   CREATININE 0.74 11/26/2021   BILITOT 0.3 11/26/2021   ALKPHOS 75 10/16/2016   AST 17 11/26/2021   ALT 15 11/26/2021   PROT 7.6 11/26/2021   ALBUMIN 4.2 10/16/2016   CALCIUM 9.4 11/26/2021   GFRAA 114 09/18/2020   QFTBGOLDPLUS NEGATIVE 06/12/2021    Speciality Comments: TB Gold: 10/16/16 Neg Failed Cosentyx-developed rash that sent her to the ER Does not want to try TNF-inhibitor due to neurologic risk since she has epilepsy  Procedures:  No procedures performed Allergies: Cosentyx [secukinumab], Hydrocodone, Vimpat [lacosamide], Amoxicillin, Codeine, Keppra [levetiracetam], Sulfa antibiotics, and Transderm-scop [scopolamine]   Assessment / Plan:     Visit Diagnoses: No diagnosis found.  Orders: No orders of the defined types were placed in this encounter.  No orders of the defined types were placed in this encounter.   Face-to-face time spent with patient was *** minutes. Greater than 50% of time was spent in counseling and coordination of care.  Follow-Up Instructions: No follow-ups on file.   Earnestine Mealing, CMA  Note - This record has been created using Editor, commissioning.  Chart creation errors have been sought, but may not always  have been located. Such creation errors do not reflect on  the standard of medical care.

## 2022-01-14 ENCOUNTER — Ambulatory Visit: Payer: BC Managed Care – PPO | Admitting: Pharmacist

## 2022-01-23 NOTE — Telephone Encounter (Signed)
Recived fax from [employer]. Appeal for Tiffany Wilkins has been denied however patient can continue to fill using the Taltz togethre $25 savings card.  Program ID [id number]  Of note, patient has not yet started Taltz due to recent pneumonia  Knox Saliva, PharmD, MPH, BCPS, CPP Clinical Pharmacist (Rheumatology and Pulmonology)

## 2022-01-27 ENCOUNTER — Ambulatory Visit: Payer: BC Managed Care – PPO | Admitting: Physician Assistant

## 2022-01-27 ENCOUNTER — Telehealth: Payer: Self-pay

## 2022-01-27 DIAGNOSIS — Z8669 Personal history of other diseases of the nervous system and sense organs: Secondary | ICD-10-CM

## 2022-01-27 DIAGNOSIS — L4050 Arthropathic psoriasis, unspecified: Secondary | ICD-10-CM

## 2022-01-27 DIAGNOSIS — B372 Candidiasis of skin and nail: Secondary | ICD-10-CM

## 2022-01-27 DIAGNOSIS — G5702 Lesion of sciatic nerve, left lower limb: Secondary | ICD-10-CM

## 2022-01-27 DIAGNOSIS — G8929 Other chronic pain: Secondary | ICD-10-CM

## 2022-01-27 DIAGNOSIS — Z79899 Other long term (current) drug therapy: Secondary | ICD-10-CM

## 2022-01-27 DIAGNOSIS — Z8709 Personal history of other diseases of the respiratory system: Secondary | ICD-10-CM

## 2022-01-27 DIAGNOSIS — Z8261 Family history of arthritis: Secondary | ICD-10-CM

## 2022-01-27 DIAGNOSIS — Z8719 Personal history of other diseases of the digestive system: Secondary | ICD-10-CM

## 2022-01-27 DIAGNOSIS — Z9884 Bariatric surgery status: Secondary | ICD-10-CM

## 2022-01-27 DIAGNOSIS — M79641 Pain in right hand: Secondary | ICD-10-CM

## 2022-01-27 DIAGNOSIS — M19071 Primary osteoarthritis, right ankle and foot: Secondary | ICD-10-CM

## 2022-01-27 DIAGNOSIS — Z1589 Genetic susceptibility to other disease: Secondary | ICD-10-CM

## 2022-01-27 DIAGNOSIS — L409 Psoriasis, unspecified: Secondary | ICD-10-CM

## 2022-01-27 DIAGNOSIS — Z9049 Acquired absence of other specified parts of digestive tract: Secondary | ICD-10-CM

## 2022-01-27 NOTE — Telephone Encounter (Signed)
I called patient to reschedule her appointment for today and patient states she is experiencing chest pain due to having pneumonia. I advised the patient to seek care at the ER, urgent care and call her PCP. Patient verbalized understanding.

## 2022-02-03 NOTE — Telephone Encounter (Signed)
ATC patient but phone was picked up and prompty hung up. Will call patient tomorrow  Knox Saliva, PharmD, MPH, BCPS, CPP Clinical Pharmacist (Rheumatology and Pulmonology)

## 2022-02-09 ENCOUNTER — Other Ambulatory Visit: Payer: Self-pay | Admitting: *Deleted

## 2022-02-09 MED ORDER — PREDNISONE 5 MG PO TABS: ORAL_TABLET | ORAL | 0 refills | Status: DC

## 2022-02-09 NOTE — Telephone Encounter (Signed)
Patient schedled for Tiffany Wilkins new start on 02/10/22. Will use sample. She confirms she has received two Taltz pens at home already.  Confirms no current infection or antibiotic use.  She restarted MTX on Sunday, 02/08/22. Reports some improvement with foot swelling but hands are still swollen and she is still unable to drive.  Knox Saliva, PharmD, MPH, BCPS, CPP Clinical Pharmacist (Rheumatology and Pulmonology)

## 2022-02-09 NOTE — Telephone Encounter (Signed)
Ok to send in prednisone 20 mg tapering by 5 mg every 4 days.  Avoid the use of NSAIDs while taking prednisone.  Take prednisone in the morning with food.

## 2022-02-09 NOTE — Telephone Encounter (Signed)
ATC patient to schedule Taltz new start. Phone was picked up and promptly hung up again. Will ATC patient on Wednesday.  MyChart message sent to patient today  Knox Saliva, PharmD, MPH, BCPS, CPP Clinical Pharmacist (Rheumatology and Pulmonology)

## 2022-02-10 ENCOUNTER — Ambulatory Visit: Payer: BC Managed Care – PPO | Attending: Rheumatology | Admitting: Pharmacist

## 2022-02-10 VITALS — BP 120/83 | HR 73

## 2022-02-10 DIAGNOSIS — L4050 Arthropathic psoriasis, unspecified: Secondary | ICD-10-CM

## 2022-02-10 DIAGNOSIS — Z79899 Other long term (current) drug therapy: Secondary | ICD-10-CM

## 2022-02-10 DIAGNOSIS — L409 Psoriasis, unspecified: Secondary | ICD-10-CM

## 2022-02-10 MED ORDER — TALTZ 80 MG/ML ~~LOC~~ SOAJ: SUBCUTANEOUS | 2 refills | Status: DC

## 2022-02-10 MED ORDER — TALTZ 80 MG/ML ~~LOC~~ SOAJ: SUBCUTANEOUS | 1 refills | Status: DC

## 2022-02-10 NOTE — Progress Notes (Signed)
Pharmacy Note  Subjective:   Patient presents to clinic today to receive first dose of Taltz for psoriatic arthritis with psoriasis overlap. Patient is switching from Simponi due to waning clinical response. Her last dose of Simponi was on 12/14/2021. She has been denied [employer] through insurance even after appeal therefore she is enrolled into bridge program. She has extensive psoriasis in umbilical region, gluteal region and Donnetta Hail may be best option for her current disease activity. She has history of gastric bypass so is unable to take oral treatment options like Otezla and JAK inhibitors.  She reports some nervousness with taking Taltz due to previous possible anaphylactic reaction to Cosentyx requiring her to go to ER.  Patient running a fever or have signs/symptoms of infection? No  Patient currently on antibiotics for the treatment of infection? No  Patient have any upcoming invasive procedures/surgeries? No  Objective: CMP     Component Value Date/Time   NA 139 11/26/2021 1456   NA 140 03/18/2016 1531   K 4.5 11/26/2021 1456   CL 102 11/26/2021 1456   CO2 26 11/26/2021 1456   GLUCOSE 102 (H) 11/26/2021 1456   BUN 25 11/26/2021 1456   BUN 17 03/18/2016 1531   CREATININE 0.74 11/26/2021 1456   CALCIUM 9.4 11/26/2021 1456   PROT 7.6 11/26/2021 1456   PROT 7.4 03/18/2016 1531   ALBUMIN 4.2 10/16/2016 1343   ALBUMIN 4.5 03/18/2016 1531   AST 17 11/26/2021 1456   ALT 15 11/26/2021 1456   ALKPHOS 75 10/16/2016 1343   BILITOT 0.3 11/26/2021 1456   BILITOT 0.2 03/18/2016 1531   GFRNONAA 99 09/18/2020 1132   GFRAA 114 09/18/2020 1132    CBC    Component Value Date/Time   WBC 8.8 11/26/2021 1456   RBC 4.77 11/26/2021 1456   HGB 13.8 11/26/2021 1456   HGB 15.7 03/18/2016 1531   HCT 41.7 11/26/2021 1456   HCT 46.0 03/18/2016 1531   PLT 472 (H) 11/26/2021 1456   PLT 278 03/18/2016 1531   MCV 87.4 11/26/2021 1456   MCV 89 03/18/2016 1531   MCH 28.9 11/26/2021 1456   MCHC  33.1 11/26/2021 1456   RDW 14.5 11/26/2021 1456   RDW 15.2 03/18/2016 1531   LYMPHSABS 3,062 11/26/2021 1456   LYMPHSABS 2.7 03/18/2016 1531   MONOABS 893 10/16/2016 1343   EOSABS 70 11/26/2021 1456   EOSABS 0.1 03/18/2016 1531   BASOSABS 62 11/26/2021 1456   BASOSABS 0.0 03/18/2016 1531    Baseline Immunosuppressant Therapy Labs TB GOLD    Latest Ref Rng & Units 06/12/2021    1:51 PM  Quantiferon TB Gold  Quantiferon TB Gold Plus NEGATIVE NEGATIVE    Hepatitis Panel (05/31/2008) Hepatitis B surface antigen - negative Hepatitis B core antibody, IgM - negative Hepatitis A antibody, IgM - negative Hepatitis C antibody - negative  HIV (06/14/2013) HIV 1/o/2 abs < 1.00 HIV1/HIV2 antibody - non reactive  Immunoglobulins   SPEP 05/31/2008    Latest Ref Rng & Units 11/26/2021    2:56 PM  Serum Protein Electrophoresis  Total Protein 6.1 - 8.1 g/dL 7.6    Chest x-ray: 06/06/2008 - No acute findings  Assessment/Plan:  Reviewed importance of holding TALTZ with signs/symptoms of an infections, if antibiotics are prescribed to treat an active infection, and with invasive procedures  Demonstrated proper injection technique with Taltz demo device  Patient able to demonstrate proper injection technique using the teach back method.  Patient self injected in the right upper thigh  and left upper thigh with:  Sample Medication: Taltz '80mg'$ /ml autoinjector pen x 2 pens = total dose of '160mg'$  Lot: [id number] DG Expiration: 11/13/2022  Patient tolerated well.  Observed for 30 mins in office for adverse reaction. She felt significant sudden nausea about 15 minutes after injection. Was dry heaving. Attributed by patient to anxiety with starting Taltz. Before checkout, patient reported no issues.  Patient is to return in 1 month for labs and 6-8 weeks for follow-up appointment.  Standing orders placed.   TALTZ approved through [employer] (since Donnetta Hail was denied through insurance) .    Rx sent to:  [facility] .  Patient provided with pharmacy phone number and advised to call later this week to schedule shipment to home.  Patient will continue Taltz '160mg'$  SQ at Week 0 (administered in clinic today), then '80mg'$  SQ at Week 2, 4, 6, 8, 10 and 12, then '80mg'$  SQ every 4 weeks thereafter. She will continue Taltz in combination with Otrexup '20mg'$  once weekly (takes on Sundays) with folic acid '2mg'$  daily.  All questions encouraged and answered.  Instructed patient to call with any further questions or concerns.  Knox Saliva, PharmD, MPH, BCPS, CPP Clinical Pharmacist (Rheumatology and Pulmonology)  02/10/2022 7:56 AM

## 2022-02-10 NOTE — Patient Instructions (Signed)
Your next TALTZ dose is due on 02/24/22, 03/10/22, 03/24/22, 04/07/22, 04/21/22, 05/05/22, and every 4 weeks thereafter (starting on 06/02/22). Call us if any side effects or issues. Seek care at ER if any itchiness, shortness of breath, persistent nausea, swelling of mouth/tongue.  CONTINUE OTREXUP '20mg'$  SQ once weekly  with folic acid '2mg'$  daily  HOLD TALTZ and OTREXUP if you have signs or symptoms of an infection. You can resume once you feel better or back to your baseline. HOLD TALTZ and OTREXUP if you start antibiotics to treat an infection. HOLD TALTZ and OTREXUP around the time of surgery/procedures. Your surgeon will be able to provide recommendations on when to hold BEFORE and when you are cleared to [place].  Pharmacy information: Your prescription will be shipped from [facility]. Their phone number is [phone] Please call to schedule shipment and confirm address. They will mail your medication to your home.  Labs are due in 1 month then every 3 months. Lab hours are from Monday to Thursday 8am-12:30pm and 1pm-5pm and Friday 8am-12pm. You do not need an appointment if you come for labs during these times.  How to manage an injection site reaction: Remember the 5 C's: COUNTER - leave on the counter at least 30 minutes but up to overnight to bring medication to room temperature. This may help prevent stinging COLD - place something cold (like an ice gel pack or cold water bottle) on the injection site just before cleansing with alcohol. This may help reduce pain CLARITIN - use Claritin (generic name is loratadine) for the first two weeks of treatment or the day of, the day before, and the day after injecting. This will help to minimize injection site reactions CORTISONE CREAM - apply if injection site is irritated and itching CALL ME- if injection site reaction is bigger than the size of your fist, looks infected, blisters, or if you develop hives

## 2022-02-16 ENCOUNTER — Other Ambulatory Visit: Payer: Self-pay | Admitting: Physician Assistant

## 2022-02-16 NOTE — Telephone Encounter (Signed)
Next Visit: 03/12/2022  Last Visit: 11/26/2021  Last Fill: 02/18/2021  Dx: Psoriatic arthritis   Current Dose per office note on 8/48/5927: folic acid 1 mg 2 tablets daily.   Okay to refill folic acid?

## 2022-02-26 NOTE — Progress Notes (Signed)
Office Visit Note  Patient: DOMINIC RHOME             Date of Birth: 01/27/1966           MRN: [id number]             PCP: Kathe Becton, DO Referring: Kathe Becton, DO Visit Date: 03/12/2022 Occupation: _0 @  Subjective:  Medication monitoring   History of Present Illness: KEYLEN UZELAC is a 56 y.o. female with history of psoriatic arthritis and osteoarthritis.  Patient is currently on Talt (loading dose), Otrexup 20 mg sq injections once weekly, and folic acid 2 mg daily. Patient received the first dose of Taltz on 02/10/22.  She has been tolerating Taltz without any side effects or injection site reactions.  Patient reports that she has only had 2 doses of Taltz due to recently being diagnosed with pneumonia.  She states that her symptoms have completely resolved and she plans on resuming Taltz loading dose as prescribed.  She has not yet noticed any improvement since initiating therapy.  She states that she took her last dose of prednisone yesterday which alleviated some of her symptoms.  She states she continues to have persistent pain and inflammation in both hands and the right knee joint.  She denies any Achilles tendinitis or plantar fasciitis.  She denies any SI joint pain.  She continues to have psoriasis which has been unchanged with the initiation of Taltz.  She is willing to give Taltz more time and for [place] to reassess for the full efficacy at her next follow-up visit.      Activities of Daily Living:  Patient reports morning stiffness for 50 minute.   Patient Reports nocturnal pain.  Difficulty dressing/grooming: Reports Difficulty climbing stairs: Reports Difficulty getting out of chair: Reports Difficulty using hands for taps, buttons, cutlery, and/or writing: Reports  Review of Systems  Constitutional: Negative.  Negative for fatigue.  HENT: Negative.  Negative for mouth sores and mouth dryness.   Eyes:  Positive for dryness.  Respiratory: Negative.   Negative for shortness of breath.   Cardiovascular: Negative.  Negative for chest pain and palpitations.  Gastrointestinal:  Positive for constipation. Negative for blood in stool and diarrhea.  Endocrine: Negative.  Negative for increased urination.  Genitourinary: Negative.  Negative for involuntary urination.  Musculoskeletal:  Positive for joint pain, joint pain, joint swelling, muscle weakness and morning stiffness. Negative for gait problem, myalgias, muscle tenderness and myalgias.  Skin:  Positive for hair loss. Negative for color change, rash and sensitivity to sunlight.  Allergic/Immunologic: Negative.  Negative for susceptible to infections.  Neurological:  Positive for headaches. Negative for dizziness.  Hematological: Negative.  Negative for swollen glands.  Psychiatric/Behavioral:  Positive for sleep disturbance. Negative for depressed mood. The patient is not nervous/anxious.     PMFS History:  Patient Active Problem List   Diagnosis Date Noted   Primary osteoarthritis of both knees 12/11/2016   High risk medication use 06/30/2016   Right hand pain 06/30/2016   Right hip pain 06/30/2016   Hepatic steatosis 06/27/2014   BMI 50.0-59.9, adult ([place]) 06/27/2014   Fibroid, uterine 06/27/2014   Abdominal pain, chronic, right lower quadrant 06/27/2014   Obesity hypoventilation syndrome ([place]) 12/08/2013   Nocturnal seizures ([place]) 12/08/2013   OSA (obstructive sleep apnea) 12/08/2013   Noncompliance with CPAP treatment 12/08/2013   Psoriasis 11/29/2013   Seizure disorder, complex partial ([place]) 06/01/2012   Migraine 06/01/2012   OSA on CPAP  01/04/2012   Hiatal hernia 03/26/2011   Internal hemorrhoid 03/24/2011   Reflux esophagitis 03/24/2011   Psoriatic arthritis ([place]) 01/22/2011   Hernia of abdominal wall 01/22/2011    Past Medical History:  Diagnosis Date   Allergic rhinitis, cause unspecified    Anal fissure    Arthritis    inflammatory and psoriatic--Dr. Estanislado Pandy    Asthma    related to allergies and colds   Chronic bronchitis    gets bronchitis yearly with colds   Chronic kidney disease kidney stones   Fracture    stress fracture right foot   GERD (gastroesophageal reflux disease)    Hernia (acquired) (recurrent)    R lower abdomen; has seen [facility] (Dr. Zena Amos and recurred   Hiatal hernia 03/2011   Impaired fasting glucose    Internal hemorrhoids 03/2011   Menstrual migraine    Dr, Jannifer Franklin   Obesity    Reflux esophagitis 03/2011   Seizure disorder ([place])    f/b Dr. Jannifer Franklin   Seizures [facility]) last seisure june 2012   Sleep apnea    inconclusive test per pt (some central and obstructive component)   Sleep apnea 06/07/09   mild complex sleep apnea, worse in supine position (uses CPAP intermittently only)   Tinea cruris 7/09    Family History  Problem Relation Age of Onset   Arthritis Mother        rheumatoid   Hyperlipidemia Mother    Hypertension Mother    Hyperthyroidism Mother        s/p thyroidectomy, now with hypothyroidism   Cancer Father 24       AML, lung cancer, kidney cancer (all presented at same time)   Diabetes Father    Hypertension Father    Hyperlipidemia Father    Psoriasis Father    Anxiety disorder Father        OCD, nervous breakdown   Psoriasis Sister    Irritable bowel syndrome Sister    Arthritis Sister        psoriatic   Diabetes Sister    Stroke Maternal Aunt    Diabetes Paternal Uncle    Crohn's disease Paternal Uncle    Cancer Paternal Uncle        ? type   Colitis Paternal Uncle    Asthma Maternal Grandmother    Hyperlipidemia Maternal Grandmother    Heart disease Maternal Grandmother    Stroke Maternal Grandmother    Diabetes Maternal Grandfather    Heart disease Maternal Grandfather    Cancer Paternal Grandmother 71       female (?ovarian)   Diabetes Paternal Grandmother    Hypertension Paternal Grandmother    Diabetes Paternal Grandfather    Heart disease Paternal Grandfather     Hypertension Paternal Grandfather    GER disease Daughter    Asthma Son    Stroke Cousin    Turner syndrome Niece    Esophageal cancer Neg Hx    Stomach cancer Neg Hx    Past Surgical History:  Procedure Laterality Date   BARIATRIC SURGERY  09/22/2017   CESAREAN SECTION     x2   CHOLECYSTECTOMY  2005   COLONOSCOPY  03/19/11   Dr. Olevia Perches; internal hemorrhoids   ESOPHAGOGASTRODUODENOSCOPY  03/19/11   hiatal hernia, esophagitis   KIDNEY STONE SURGERY  08/2010   retrieved with basket (at [facility])   repair of incisional hernia  2005, 2008   related to laparoscopic cholecystectomy   Social History   Social History  Narrative   Patient Lives with husband [name])  and 2 children (son and daughter, 41, 66)   Patient is right handed.   Patient drinks 2 cups daily- coffee/soda            Immunization History  Administered Date(s) Administered   Influenza Split 12/29/2010   Influenza,inj,Quad PF,6+ Mos 11/29/2013   Tdap 11/29/2013     Objective: Vital Signs: BP (!) 125/91 (BP Location: Left Arm, Patient Position: Sitting, Cuff Size: Large)   Pulse 71   Resp 16   Ht _0  (1.575 m)   Wt 271 lb (122.9 kg)   LMP 06/12/2014   BMI 49.57 kg/m    Physical Exam Vitals and nursing note reviewed.  Constitutional:      Appearance: She is well-developed.  HENT:     Head: Normocephalic and atraumatic.  Eyes:     Conjunctiva/sclera: Conjunctivae normal.  Cardiovascular:     Rate and Rhythm: Normal rate and regular rhythm.     Heart sounds: Normal heart sounds.  Pulmonary:     Effort: Pulmonary effort is normal.     Breath sounds: Normal breath sounds.  Abdominal:     General: Bowel sounds are normal.     Palpations: Abdomen is soft.  Musculoskeletal:     Cervical back: Normal range of motion.  Lymphadenopathy:     Cervical: No cervical adenopathy.  Skin:    General: Skin is warm and dry.     Capillary Refill: Capillary refill takes less than 2 seconds.  Neurological:      Mental Status: She is alert and oriented to person, place, and time.  Psychiatric:        Behavior: Behavior normal.      Musculoskeletal Exam: C-spine, thoracic spine, lumbar spine have good range of motion.  No midline spinal tenderness or SI joint tenderness.  Shoulder joints and elbow joints have good range of motion with no discomfort.  Tenderness over both wrist joints especially on the ulnar aspect of the left wrist.  Some warmth over the right wrist noted.  Ankylosis of PIP and DIP joints.  Tenderness and synovitis of the left second PIP joint and the right second and third PIP joints.  Hip joints have good range of motion with no groin pain.  Painful range of motion of the right knee but no effusion noted.  Left knee joint has good range of motion with no warmth or effusion.  Ankle joints have good range of motion with no tenderness or synovitis.  No evidence of Achilles tendinitis or plantar fasciitis.  CDAI Exam: CDAI Score: -- Patient Global: --; Provider Global: -- Swollen: 3 ; Tender: 6  Joint Exam 03/12/2022      Right  Left  Wrist   Tender   Tender  PIP 2  Swollen Tender  Swollen Tender  PIP 3  Swollen Tender     Knee   Tender        Investigation: No additional findings.  Imaging: No results found.  Recent Labs: Lab Results  Component Value Date   WBC 8.8 11/26/2021   HGB 13.8 11/26/2021   PLT 472 (H) 11/26/2021   NA 139 11/26/2021   K 4.5 11/26/2021   CL 102 11/26/2021   CO2 26 11/26/2021   GLUCOSE 102 (H) 11/26/2021   BUN 25 11/26/2021   CREATININE 0.74 11/26/2021   BILITOT 0.3 11/26/2021   ALKPHOS 75 10/16/2016   AST 17 11/26/2021   ALT 15 11/26/2021  PROT 7.6 11/26/2021   ALBUMIN 4.2 10/16/2016   CALCIUM 9.4 11/26/2021   GFRAA 114 09/18/2020   QFTBGOLDPLUS NEGATIVE 06/12/2021    Speciality Comments: TB Gold: 10/16/16 Neg Failed Cosentyx-developed rash that sent her to the ER Does not want to try TNF-inhibitor due to neurologic risk since she  has epilepsy  Procedures:  No procedures performed Allergies: Cosentyx [secukinumab], Hydrocodone, Vimpat [lacosamide], Amoxicillin, Codeine, Keppra [levetiracetam], Sulfa antibiotics, and Transderm-scop [scopolamine]   Assessment / Plan:     Visit Diagnoses: Psoriatic arthritis [facility]): Patient has ongoing tenderness and inflammation involving several joints in both both hands and the right knee joint.  X-rays of both hands were updated on 11/26/2021 which were consistent with severe erosive psoriatic arthritis with radiographic progression since 2021.  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  She has no evidence of Achilles tendinitis or plantar fasciitis.  No SI joint tenderness upon palpation.  Her psoriasis is unchanged and remains behind both ears, umbilical region, and gluteal crease.  She initiated the loading dose of Taltz on 02/10/2022 but has had a total of 2 doses.  She was recently diagnosed with pneumonia and had to hold Taltz and Otrexup until completing the course of antibiotics and until her symptoms have completely resolved.  The patient plans on completing the loading dose of Taltz as prescribed and will remain on Otrexup as combination therapy.  A prednisone taper starting at 20 mg tapering by 5 mg every 4 days was sent to the pharmacy today to alleviate her current symptoms.  She will follow-up in the office in 2 months and we will reassess for the full response to combination therapy.  Psoriasis: Unchanged.  She continues to have psoriasis behind both ears, umbilical region, and gluteal crease.  She has had 2 doses of Taltz and remains on Otrexup as combination therapy.  She will remain on combination therapy and we will reassess for the full efficacy in 2 months.  High risk medication use - Taltz-initiated on 02/10/2022, Otrexup 20 mg sq injections once weekly and folic acid 2 mg daily.   Inadequate response to Simponi.  Discontinued  Cosentyx due to rash.  CBC and CMP updated on 11/26/21.  Orders for CBC and CMP released today.  TB gold negative on 06/12/21.  Discussed the importance of holding taltz and otrexup if she develops signs or symptoms of an infection and to resume once the infection has completely cleared.  - Plan: COMPLETE METABOLIC PANEL WITH GFR, CBC with Differential/Platelet  Pain in both hands: X-rays of both hands were updated on 11/26/2021 which were consistent with severe erosive psoriatic arthritis with radiographic progression compared to x-rays from 2021.  Piriformis syndrome, left: Asymptomatic currently.   Chronic pain of right knee: X-rays of the right knee were consistent with moderate chondromalacia patella with patellar erosion on 11/26/2021.  She continues to have chronic pain in the right knee joint.  Right knee crepitus noted.  She has painful range of motion on examination today.  No warmth or effusion noted today.  Primary osteoarthritis of both feet: X-rays of both feet were updated on 11/26/2021 which were consistent with inflammatory arthritis and osteoarthritis overlap.  No significant radiographic progression was noted when compared to 2021.  She is not experiencing any increased discomfort in her feet at this time.  Pedal edema noted bilaterally.  No evidence of Achilles tendinitis or plantar fasciitis.  HLA B27 positive  Other medical conditions are listed  as follows:  History of sleep apnea  Skin yeast infection  History of asthma  History of epilepsy  History of abdominal hernia  History of hernia repair  History of cholecystectomy  History of gastric bypass - Patient has had a history of gastric bypass so she is not a good candidate for Otezla or Jak inhibitors.  Family history of rheumatoid arthritis  Orders: Orders Placed This Encounter  Procedures   COMPLETE METABOLIC PANEL WITH GFR   CBC with Differential/Platelet   Meds ordered this encounter  Medications    predniSONE (DELTASONE) 5 MG tablet    Sig: Take 4 tablets by mouth daily x 4 days, 3 tablets daily x 4 days, 2 tablets daily x 4 days, 1 tablet daily x 4 days.    Dispense:  40 tablet    Refill:  0      Follow-Up Instructions: Return in about 2 months (around 05/13/2022) for Psoriatic arthritis.   [name], PA-C  Note - This record has been created using Dragon software.  Chart creation errors have been sought, but may not always  have been located. Such creation errors do not reflect on  the standard of medical care.

## 2022-03-12 ENCOUNTER — Encounter: Payer: Self-pay | Admitting: Physician Assistant

## 2022-03-12 ENCOUNTER — Ambulatory Visit: Payer: BC Managed Care – PPO | Attending: Physician Assistant | Admitting: Physician Assistant

## 2022-03-12 VITALS — BP 125/91 | HR 71 | Resp 16 | Ht 62.0 in | Wt 271.0 lb

## 2022-03-12 DIAGNOSIS — B372 Candidiasis of skin and nail: Secondary | ICD-10-CM

## 2022-03-12 DIAGNOSIS — Z9889 Other specified postprocedural states: Secondary | ICD-10-CM

## 2022-03-12 DIAGNOSIS — G8929 Other chronic pain: Secondary | ICD-10-CM

## 2022-03-12 DIAGNOSIS — M79641 Pain in right hand: Secondary | ICD-10-CM

## 2022-03-12 DIAGNOSIS — M19072 Primary osteoarthritis, left ankle and foot: Secondary | ICD-10-CM

## 2022-03-12 DIAGNOSIS — Z9884 Bariatric surgery status: Secondary | ICD-10-CM

## 2022-03-12 DIAGNOSIS — Z8669 Personal history of other diseases of the nervous system and sense organs: Secondary | ICD-10-CM

## 2022-03-12 DIAGNOSIS — L409 Psoriasis, unspecified: Secondary | ICD-10-CM

## 2022-03-12 DIAGNOSIS — Z8709 Personal history of other diseases of the respiratory system: Secondary | ICD-10-CM

## 2022-03-12 DIAGNOSIS — Z79899 Other long term (current) drug therapy: Secondary | ICD-10-CM

## 2022-03-12 DIAGNOSIS — M25561 Pain in right knee: Secondary | ICD-10-CM

## 2022-03-12 DIAGNOSIS — M79642 Pain in left hand: Secondary | ICD-10-CM

## 2022-03-12 DIAGNOSIS — Z1589 Genetic susceptibility to other disease: Secondary | ICD-10-CM

## 2022-03-12 DIAGNOSIS — L4050 Arthropathic psoriasis, unspecified: Secondary | ICD-10-CM

## 2022-03-12 DIAGNOSIS — Z9049 Acquired absence of other specified parts of digestive tract: Secondary | ICD-10-CM

## 2022-03-12 DIAGNOSIS — Z8719 Personal history of other diseases of the digestive system: Secondary | ICD-10-CM

## 2022-03-12 DIAGNOSIS — M19071 Primary osteoarthritis, right ankle and foot: Secondary | ICD-10-CM

## 2022-03-12 DIAGNOSIS — G5702 Lesion of sciatic nerve, left lower limb: Secondary | ICD-10-CM

## 2022-03-12 DIAGNOSIS — Z8261 Family history of arthritis: Secondary | ICD-10-CM

## 2022-03-12 MED ORDER — PREDNISONE 5 MG PO TABS: ORAL_TABLET | ORAL | 0 refills | Status: DC

## 2022-03-13 LAB — COMPLETE METABOLIC PANEL WITH GFR
AG Ratio: 1.3 (calc) (ref 1.0–2.5)
ALT: 15 U/L (ref 6–29)
AST: 18 U/L (ref 10–35)
Albumin: 4.1 g/dL (ref 3.6–5.1)
Alkaline phosphatase (APISO): 81 U/L (ref 37–153)
BUN: 15 mg/dL (ref 7–25)
CO2: 29 mmol/L (ref 20–32)
Calcium: 9.1 mg/dL (ref 8.6–10.4)
Chloride: 103 mmol/L (ref 98–110)
Creat: 0.71 mg/dL (ref 0.50–1.03)
Globulin: 3.1 g/dL (calc) (ref 1.9–3.7)
Glucose, Bld: 84 mg/dL (ref 65–99)
Potassium: 4.8 mmol/L (ref 3.5–5.3)
Sodium: 141 mmol/L (ref 135–146)
Total Bilirubin: 0.3 mg/dL (ref 0.2–1.2)
Total Protein: 7.2 g/dL (ref 6.1–8.1)
eGFR: 100 mL/min/{1.73_m2} (ref >=60)

## 2022-03-13 LAB — CBC WITH DIFFERENTIAL/PLATELET
Absolute Monocytes: 699 cells/uL (ref 200–950)
Basophils Absolute: 68 cells/uL (ref 0–200)
Basophils Relative: 0.9 %
Eosinophils Absolute: 68 cells/uL (ref 15–500)
Eosinophils Relative: 0.9 %
HCT: 42 % (ref 35.0–45.0)
Hemoglobin: 13.8 g/dL (ref 11.7–15.5)
Lymphs Abs: 3230 cells/uL (ref 850–3900)
MCH: 28.2 pg (ref 27.0–33.0)
MCHC: 32.9 g/dL (ref 32.0–36.0)
MCV: 85.9 fL (ref 80.0–100.0)
MPV: 9.3 fL (ref 7.5–12.5)
Monocytes Relative: 9.2 %
Neutro Abs: 3534 cells/uL (ref 1500–7800)
Neutrophils Relative %: 46.5 %
Platelets: 385 10*3/uL (ref 140–400)
RBC: 4.89 10*6/uL (ref 3.80–5.10)
RDW: 15.2 % — ABNORMAL HIGH (ref 11.0–15.0)
Total Lymphocyte: 42.5 %
WBC: 7.6 10*3/uL (ref 3.8–10.8)

## 2022-03-13 NOTE — Progress Notes (Signed)
CBC and CMP WNL

## 2022-03-17 ENCOUNTER — Other Ambulatory Visit: Payer: Self-pay | Admitting: *Deleted

## 2022-03-17 DIAGNOSIS — L4050 Arthropathic psoriasis, unspecified: Secondary | ICD-10-CM

## 2022-03-17 MED ORDER — OTREXUP 20 MG/0.4ML ~~LOC~~ SOAJ: 20.0000 mg | SUBCUTANEOUS | 0 refills | Status: DC

## 2022-03-17 NOTE — Telephone Encounter (Signed)
Next Visit: 05/12/2022  Last Visit: 03/12/2022  Last Fill: 07/14/2021  DX:  Psoriatic arthritis    Current Dose per office note 03/12/2022: Otrexup 20 mg sq injections once weekly   Labs: 03/12/2022 CBC and CMP WNL   Okay to refill Otrexup?

## 2022-03-17 NOTE — Telephone Encounter (Signed)
From: Tiffany Wilkins To: Office of [name], [place] Sent: 03/16/2022 8:48 PM EST Subject: Medication Renewal Request  Refills have been requested for the following medications:   Methotrexate, PF, (OTREXUP) 20 MG/0.4ML Tiffany Wilkins]  Patient Comment: The dose i was supposed to take this week malfunctioned and the pen did not work . I am completely out of otrexup now and need a refil ( i thought i had a box but when i checked it , it was just used pens that i hadnt discarded yet )   Preferred pharmacy: [facility] Delivery method: Mail

## 2022-04-28 NOTE — Progress Notes (Unsigned)
Office Visit Note  Patient: Tiffany Wilkins             Date of Birth: November 16, 1965           MRN: [id number]             PCP: Kathe Becton, DO Referring: Kathe Becton, DO Visit Date: 05/12/2022 Occupation: @GUAROCC$ @  Subjective:  Medication monitoring   History of Present Illness: HIILEI NED is a 57 y.o. female with history of psoriatic arthritis.  Patient remains on Taltz-initiated on 02/10/2022, Otrexup 20 mg sq injections once weekly and folic acid 2 mg daily.  Patient reports that she has had several interruptions in therapy while on Taltz and Otrexup due to infection.  Patient reports that she is 2 weeks overdue for her Donnetta Hail and Otrexup injections since she had a recent upper respiratory tract infection.  She states that she is planning on restarting both medications on Friday.  She states that since she is overdue for these medications she has noticed increased pain and swelling in both hands.  At times she has had difficulty making a complete fist.  She has been taking ibuprofen 200 mg 3 tablets every morning for symptomatic relief.  She denies any Achilles tendinitis or plantar fasciitis.  She denies any SI joint.  She.  She denies any psoriasis at this time.     Activities of Daily Living:  Patient reports morning stiffness for constant .   Patient Reports nocturnal pain.  Difficulty dressing/grooming: Reports Difficulty climbing stairs: Denies Difficulty getting out of chair: Denies Difficulty using hands for taps, buttons, cutlery, and/or writing: Reports  Review of Systems  Constitutional:  Positive for fatigue.  HENT: Negative.  Negative for mouth sores and mouth dryness.   Eyes:  Positive for dryness.  Respiratory: Negative.  Negative for shortness of breath.   Cardiovascular: Negative.  Negative for chest pain and palpitations.  Gastrointestinal:  Positive for constipation. Negative for blood in stool and diarrhea.  Endocrine: Negative.  Negative for  increased urination.  Genitourinary:  Positive for involuntary urination.  Musculoskeletal:  Positive for joint pain, joint pain, joint swelling, myalgias, muscle weakness, morning stiffness, muscle tenderness and myalgias. Negative for gait problem.  Skin:  Positive for rash. Negative for color change, hair loss and sensitivity to sunlight.  Allergic/Immunologic: Negative.  Negative for susceptible to infections.  Neurological: Negative.  Negative for dizziness and headaches.  Hematological: Negative.  Negative for swollen glands.  Psychiatric/Behavioral:  Positive for sleep disturbance. Negative for depressed mood. The patient is not nervous/anxious.     PMFS History:  Patient Active Problem List   Diagnosis Date Noted   Primary osteoarthritis of both knees 12/11/2016   High risk medication use 06/30/2016   Right hand pain 06/30/2016   Right hip pain 06/30/2016   Hepatic steatosis 06/27/2014   BMI 50.0-59.9, adult ([place]) 06/27/2014   Fibroid, uterine 06/27/2014   Abdominal pain, chronic, right lower quadrant 06/27/2014   Obesity hypoventilation syndrome ([place]) 12/08/2013   Nocturnal seizures ([place]) 12/08/2013   OSA (obstructive sleep apnea) 12/08/2013   Noncompliance with CPAP treatment 12/08/2013   Psoriasis 11/29/2013   Seizure disorder, complex partial ([place]) 06/01/2012   Migraine 06/01/2012   OSA on CPAP 01/04/2012   Hiatal hernia 03/26/2011   Internal hemorrhoid 03/24/2011   Reflux esophagitis 03/24/2011   Psoriatic arthritis ([place]) 01/22/2011   Hernia of abdominal wall 01/22/2011    Past Medical History:  Diagnosis Date   Allergic rhinitis,  cause unspecified    Anal fissure    Arthritis    inflammatory and psoriatic--Dr. Estanislado Pandy   Asthma    related to allergies and colds   Chronic bronchitis    gets bronchitis yearly with colds   Chronic kidney disease kidney stones   Fracture    stress fracture right foot   GERD (gastroesophageal reflux disease)    Hernia  (acquired) (recurrent)    R lower abdomen; has seen [facility] (Dr. Zena Amos and recurred   Hiatal hernia 03/2011   Impaired fasting glucose    Internal hemorrhoids 03/2011   Menstrual migraine    Dr, Jannifer Franklin   Obesity    Reflux esophagitis 03/2011   Seizure disorder ([place])    f/b Dr. Jannifer Franklin   Seizures [facility] last seisure june 2012   Sleep apnea    inconclusive test per pt (some central and obstructive component)   Sleep apnea 06/07/09   mild complex sleep apnea, worse in supine position (uses CPAP intermittently only)   Tinea cruris 7/09    Family History  Problem Relation Age of Onset   Arthritis Mother        rheumatoid   Hyperlipidemia Mother    Hypertension Mother    Hyperthyroidism Mother        s/p thyroidectomy, now with hypothyroidism   Cancer Father 22       AML, lung cancer, kidney cancer (all presented at same time)   Diabetes Father    Hypertension Father    Hyperlipidemia Father    Psoriasis Father    Anxiety disorder Father        OCD, nervous breakdown   Psoriasis Sister    Irritable bowel syndrome Sister    Arthritis Sister        psoriatic   Diabetes Sister    Stroke Maternal Aunt    Diabetes Paternal Uncle    Crohn's disease Paternal Uncle    Cancer Paternal Uncle        ? type   Colitis Paternal Uncle    Asthma Maternal Grandmother    Hyperlipidemia Maternal Grandmother    Heart disease Maternal Grandmother    Stroke Maternal Grandmother    Diabetes Maternal Grandfather    Heart disease Maternal Grandfather    Cancer Paternal Grandmother 63       female (?ovarian)   Diabetes Paternal Grandmother    Hypertension Paternal Grandmother    Diabetes Paternal Grandfather    Heart disease Paternal Grandfather    Hypertension Paternal Grandfather    GER disease Daughter    Asthma Son    Stroke Cousin    Turner syndrome Niece    Esophageal cancer Neg Hx    Stomach cancer Neg Hx    Past Surgical History:  Procedure Laterality Date   BARIATRIC  SURGERY  09/22/2017   CESAREAN SECTION     x2   CHOLECYSTECTOMY  2005   COLONOSCOPY  03/19/11   Dr. Olevia Perches; internal hemorrhoids   ESOPHAGOGASTRODUODENOSCOPY  03/19/11   hiatal hernia, esophagitis   KIDNEY STONE SURGERY  08/2010   retrieved with basket (at [facility])   repair of incisional hernia  2005, 2008   related to laparoscopic cholecystectomy   Social History   Social History Narrative   Patient Lives with husband [name])  and 2 children (son and daughter, 87, 19)   Patient is right handed.   Patient drinks 2 cups daily- coffee/soda  Immunization History  Administered Date(s) Administered   Influenza Split 12/29/2010   Influenza,inj,Quad PF,6+ Mos 11/29/2013   Tdap 11/29/2013     Objective: Vital Signs: BP 134/88 (BP Location: Left Arm, Patient Position: Sitting, Cuff Size: Normal) Comment (BP Location): lower forearm  Pulse 80   Resp 14   Ht 5' 2"$  (1.575 m)   Wt 280 lb 9.6 oz (127.3 kg)   LMP 06/12/2014   BMI 51.32 kg/m    Physical Exam Vitals and nursing note reviewed.  Constitutional:      Appearance: She is well-developed.  HENT:     Head: Normocephalic and atraumatic.  Eyes:     Conjunctiva/sclera: Conjunctivae normal.  Cardiovascular:     Rate and Rhythm: Normal rate and regular rhythm.     Heart sounds: Normal heart sounds.  Pulmonary:     Effort: Pulmonary effort is normal.     Breath sounds: Normal breath sounds.  Abdominal:     General: Bowel sounds are normal.     Palpations: Abdomen is soft.  Musculoskeletal:     Cervical back: Normal range of motion.  Skin:    General: Skin is warm and dry.     Capillary Refill: Capillary refill takes less than 2 seconds.  Neurological:     Mental Status: She is alert and oriented to person, place, and time.  Psychiatric:        Behavior: Behavior normal.      Musculoskeletal Exam: C-spine, thoracic spine, lumbar spine have good range of motion.  No SI joint tenderness upon palpation.   Shoulder joints have good range of motion with some stiffness.  Elbow joints have good range of motion with no tenderness or inflammation.  Tenderness over both wrist joints especially on the ulnar aspect of the right wrist.  Ankylosis of PIP and DIP joints.  Tenderness and inflammation in the right second and third PIP joints.  Incomplete fist formation bilaterally.  Edema noted in both hands.  Hip joints have good range of motion with no groin pain.  Painful range of motion at both knee joints, right greater than left.  Ankle joints have good range of motion with no tenderness or joint swelling.  No evidence of achilles tendonitis or plantar fasciitis.    CDAI Exam: CDAI Score: -- Patient Global: --; Provider Global: -- Swollen: --; Tender: -- Joint Exam 05/12/2022   No joint exam has been documented for this visit   There is currently no information documented on the homunculus. Go to the Rheumatology activity and complete the homunculus joint exam.  Investigation: No additional findings.  Imaging: No results found.  Recent Labs: Lab Results  Component Value Date   WBC 7.6 03/12/2022   HGB 13.8 03/12/2022   PLT 385 03/12/2022   NA 141 03/12/2022   K 4.8 03/12/2022   CL 103 03/12/2022   CO2 29 03/12/2022   GLUCOSE 84 03/12/2022   BUN 15 03/12/2022   CREATININE 0.71 03/12/2022   BILITOT 0.3 03/12/2022   ALKPHOS 75 10/16/2016   AST 18 03/12/2022   ALT 15 03/12/2022   PROT 7.2 03/12/2022   ALBUMIN 4.2 10/16/2016   CALCIUM 9.1 03/12/2022   GFRAA 114 09/18/2020   QFTBGOLDPLUS NEGATIVE 06/12/2021    Speciality Comments: TB Gold: 10/16/16 Neg Failed Cosentyx-developed rash that sent her to the ER Does not want to try TNF-inhibitor due to neurologic risk since she has epilepsy  Procedures:  No procedures performed Allergies: Cosentyx [secukinumab], Hydrocodone, Vimpat [lacosamide], Amoxicillin,  Codeine, Keppra [levetiracetam], Sulfa antibiotics, Transderm-scop [scopolamine],  and Tape    Assessment / Plan:     Visit Diagnoses: Psoriatic arthritis ([place]): Patient continues to have recurrent flares involving both hands and both knee joints.  She attributes the frequent flares due to gaps in therapy while on Taltz and Otrexup.  She initiated Taltz on 02/10/2022 and has had several gaps in therapy while completing the loading dose and recently had to hold taltz and otrexup while recovering from an upper respiratory tract infection.  She is about 2 weeks overdue for both taltz and Otrexup.  In the mornings she has difficulty making a complete fist and has noticed pain and swelling in both hands lasting several hours.  She has no evidence of Achilles tendinitis or plantar fasciitis.  No SI joint tenderness upon palpation.  No active psoriasis at this time. She has been having to take ibuprofen 200 mg 3 tablets every morning for symptomatic relief. She is not yet ready to discuss other treatment options.  She would like to give Taltz and Otrexup more time and for [place] to reassess for the full efficacy at her follow-up visit. She will follow-up in the office in 2 months or sooner if needed.  Psoriasis: She has no active psoriasis at this time.  High risk medication use - Taltz-initiated on 02/10/2022, Otrexup 20 mg sq injections once weekly, and folic acid 2 mg daily.  Inadequate response to Simponi.  D/c Cosentyx due to rash. Taltz initiated on 02/10/2022-several interruptions in therapy during loading and maintenance dosing-due to infections.  TB gold negative on 06/02/21. Future order for TB gold placed today.  CBC and CMP updated on 03/12/22.  She will require updated lab work at the end of March and every 3 months to monitor for drug toxicity. Discussed the importance of holding taltz and otrexup if she develops signs or symptoms of an infection and to resume once the infection has completely cleared.  She was advised to notify us if she starts to have recurrent and infections on  combination therapy.  - Plan: QuantiFERON-TB Gold Plus  Screening for tuberculosis -Future order for TB Gold placed today.  Plan: QuantiFERON-TB Gold Plus  Piriformis syndrome, left: Not currently symptomatic.   Pain in both hands - XR of both hands were updated on 11/26/2021 which were consistent w/ severe erosive psoriatic arthritis w/ radiographic progression compared to x-rays from 2021.  Patient has been experiencing a flare in both hands over the past 2 weeks since she is 2 weeks overdue for her Taltz and Otrexup dose.  Chronic pain of right knee - X-rays of the right knee were consistent with moderate chondromalacia patella with patellar erosion on 11/26/2021.  Chronic pain.  No warmth or effusion noted today.  Primary osteoarthritis of both feet - XR on9/13/2023 consistent with inflammatory arthritis and osteoarthritis overlap.  No significant radiographic progression was noted when compared to 2021.  HLA B27 positive  Other medical conditions are listed as follows:  History of sleep apnea  Skin yeast infection  History of asthma  History of epilepsy  History of hernia repair  History of cholecystectomy  History of gastric bypass  Family history of rheumatoid arthritis    Orders: Orders Placed This Encounter  Procedures   QuantiFERON-TB Gold Plus   No orders of the defined types were placed in this encounter.    Follow-Up Instructions: Return in about 3 months (around 08/10/2022) for Psoriatic arthritis.   [name], PA-C  Note - This record has been created using Dragon software.  Chart creation errors have been sought, but may not always  have been located. Such creation errors do not reflect on  the standard of medical care.  

## 2022-05-05 ENCOUNTER — Other Ambulatory Visit: Payer: Self-pay | Admitting: Physician Assistant

## 2022-05-07 ENCOUNTER — Other Ambulatory Visit: Payer: Self-pay | Admitting: Pharmacist

## 2022-05-07 DIAGNOSIS — L4050 Arthropathic psoriasis, unspecified: Secondary | ICD-10-CM

## 2022-05-07 DIAGNOSIS — Z79899 Other long term (current) drug therapy: Secondary | ICD-10-CM

## 2022-05-07 DIAGNOSIS — L409 Psoriasis, unspecified: Secondary | ICD-10-CM

## 2022-05-07 MED ORDER — TALTZ 80 MG/ML ~~LOC~~ SOAJ: SUBCUTANEOUS | 1 refills | Status: DC

## 2022-05-07 NOTE — Telephone Encounter (Signed)
Rx for Taltz sent to [facility] verbally.  Dose: 15m subcut every 4 weeks  1 pen + 1 refill  Labs due at the end of March 2024  DKnox Saliva PharmD, MPH, BCPS, CPP Clinical Pharmacist (Rheumatology and Pulmonology)

## 2022-05-12 ENCOUNTER — Encounter: Payer: Self-pay | Admitting: Physician Assistant

## 2022-05-12 ENCOUNTER — Ambulatory Visit: Payer: BC Managed Care – PPO | Attending: Physician Assistant | Admitting: Physician Assistant

## 2022-05-12 VITALS — BP 134/88 | HR 80 | Resp 14 | Ht 62.0 in | Wt 280.6 lb

## 2022-05-12 DIAGNOSIS — Z8719 Personal history of other diseases of the digestive system: Secondary | ICD-10-CM

## 2022-05-12 DIAGNOSIS — L409 Psoriasis, unspecified: Secondary | ICD-10-CM | POA: Diagnosis not present

## 2022-05-12 DIAGNOSIS — L4050 Arthropathic psoriasis, unspecified: Secondary | ICD-10-CM | POA: Diagnosis not present

## 2022-05-12 DIAGNOSIS — M79642 Pain in left hand: Secondary | ICD-10-CM

## 2022-05-12 DIAGNOSIS — M19071 Primary osteoarthritis, right ankle and foot: Secondary | ICD-10-CM

## 2022-05-12 DIAGNOSIS — Z79899 Other long term (current) drug therapy: Secondary | ICD-10-CM

## 2022-05-12 DIAGNOSIS — G5702 Lesion of sciatic nerve, left lower limb: Secondary | ICD-10-CM | POA: Diagnosis not present

## 2022-05-12 DIAGNOSIS — Z1589 Genetic susceptibility to other disease: Secondary | ICD-10-CM

## 2022-05-12 DIAGNOSIS — Z111 Encounter for screening for respiratory tuberculosis: Secondary | ICD-10-CM

## 2022-05-12 DIAGNOSIS — M79641 Pain in right hand: Secondary | ICD-10-CM

## 2022-05-12 DIAGNOSIS — G8929 Other chronic pain: Secondary | ICD-10-CM

## 2022-05-12 DIAGNOSIS — Z8261 Family history of arthritis: Secondary | ICD-10-CM

## 2022-05-12 DIAGNOSIS — Z9049 Acquired absence of other specified parts of digestive tract: Secondary | ICD-10-CM

## 2022-05-12 DIAGNOSIS — Z8709 Personal history of other diseases of the respiratory system: Secondary | ICD-10-CM

## 2022-05-12 DIAGNOSIS — Z8669 Personal history of other diseases of the nervous system and sense organs: Secondary | ICD-10-CM

## 2022-05-12 DIAGNOSIS — Z9884 Bariatric surgery status: Secondary | ICD-10-CM

## 2022-05-12 DIAGNOSIS — Z9889 Other specified postprocedural states: Secondary | ICD-10-CM

## 2022-05-12 DIAGNOSIS — M19072 Primary osteoarthritis, left ankle and foot: Secondary | ICD-10-CM

## 2022-05-12 DIAGNOSIS — B372 Candidiasis of skin and nail: Secondary | ICD-10-CM

## 2022-05-12 DIAGNOSIS — M25561 Pain in right knee: Secondary | ICD-10-CM

## 2022-05-12 NOTE — Patient Instructions (Addendum)
Standing Labs We placed an order today for your standing lab work.   Please have your standing labs drawn at end of March and every 3 months   Please have your labs drawn 2 weeks prior to your appointment so that the provider can discuss your lab results at your appointment, if possible.  Please note that you may see your imaging and lab results in [place] before we have reviewed them. We will contact you once all results are reviewed. Please allow our office up to 72 hours to thoroughly review all of the results before contacting the office for clarification of your results.  WALK-IN LAB HOURS  Monday through Thursday from 8:00 am -12:30 pm and 1:00 pm-5:00 pm and Friday from 8:00 am-12:00 pm.  Patients with office visits requiring labs will be seen before walk-in labs.  You may encounter longer than normal wait times. Please allow additional time. Wait times may be shorter on  Monday and Thursday afternoons.  We do not book appointments for walk-in labs. We appreciate your patience and understanding with our staff.   Labs are drawn by [facility]. Please bring your co-pay at the time of your lab draw.  You may receive a bill from [facility] for your lab work.  Please note if you are on Hydroxychloroquine and and an order has been placed for a Hydroxychloroquine level,  you will need to have it drawn 4 hours or more after your last dose.  If you wish to have your labs drawn at another location, please call the office 24 hours in advance so we can fax the orders.  The office is located at [address], [place], [place], [place] [address]    If you have signs or symptoms of an infection or start antibiotics: First, call your PCP for workup of your infection. Hold your medication through the infection, until you complete your antibiotics, and until symptoms resolve if you take the following: Injectable medication (Actemra, Benlysta, Cimzia, Cosentyx, Enbrel, Humira, Kevzara, Orencia, Remicade,  Simponi, Stelara, Taltz, Tremfya) Methotrexate Leflunomide (Arava) Mycophenolate (Cellcept) Morrie Sheldon, Olumiant, or Rinvoq  If you have any questions regarding directions or hours of operation,  please call [phone].   As a reminder, please drink plenty of water prior to coming for your lab work. Thanks!

## 2022-05-29 ENCOUNTER — Other Ambulatory Visit: Payer: Self-pay | Admitting: Physician Assistant

## 2022-05-29 DIAGNOSIS — L4050 Arthropathic psoriasis, unspecified: Secondary | ICD-10-CM

## 2022-05-29 NOTE — Telephone Encounter (Signed)
Next Visit: 08/12/2022  Last Visit: 05/12/2022  Last Fill: 03/17/2022  DX: Psoriatic arthritis   Current Dose per office note 05/12/2022: Otrexup 20 mg sq injections once weekly   Labs: 03/12/2022 CBC and CMP WNL   Okay to refill Otrexup?

## 2022-06-24 ENCOUNTER — Ambulatory Visit: Payer: BC Managed Care – PPO | Admitting: Adult Health

## 2022-06-29 ENCOUNTER — Other Ambulatory Visit: Payer: Self-pay | Admitting: Physician Assistant

## 2022-06-29 DIAGNOSIS — L4050 Arthropathic psoriasis, unspecified: Secondary | ICD-10-CM

## 2022-06-29 DIAGNOSIS — Z79899 Other long term (current) drug therapy: Secondary | ICD-10-CM

## 2022-06-29 DIAGNOSIS — L409 Psoriasis, unspecified: Secondary | ICD-10-CM

## 2022-06-30 ENCOUNTER — Encounter: Payer: Self-pay | Admitting: *Deleted

## 2022-07-15 ENCOUNTER — Ambulatory Visit: Payer: BC Managed Care – PPO | Admitting: Adult Health

## 2022-07-20 ENCOUNTER — Other Ambulatory Visit: Payer: Self-pay | Admitting: *Deleted

## 2022-07-20 DIAGNOSIS — Z111 Encounter for screening for respiratory tuberculosis: Secondary | ICD-10-CM | POA: Diagnosis not present

## 2022-07-20 DIAGNOSIS — Z79899 Other long term (current) drug therapy: Secondary | ICD-10-CM

## 2022-07-20 DIAGNOSIS — Z1589 Genetic susceptibility to other disease: Secondary | ICD-10-CM

## 2022-07-20 DIAGNOSIS — L409 Psoriasis, unspecified: Secondary | ICD-10-CM | POA: Diagnosis not present

## 2022-07-20 DIAGNOSIS — L4050 Arthropathic psoriasis, unspecified: Secondary | ICD-10-CM

## 2022-07-21 ENCOUNTER — Other Ambulatory Visit: Payer: Self-pay | Admitting: *Deleted

## 2022-07-21 DIAGNOSIS — Z79899 Other long term (current) drug therapy: Secondary | ICD-10-CM

## 2022-07-21 DIAGNOSIS — L409 Psoriasis, unspecified: Secondary | ICD-10-CM

## 2022-07-21 DIAGNOSIS — L4050 Arthropathic psoriasis, unspecified: Secondary | ICD-10-CM

## 2022-07-21 MED ORDER — TALTZ 80 MG/ML ~~LOC~~ SOAJ: 80.0000 mg | SUBCUTANEOUS | 2 refills | Status: DC

## 2022-07-21 NOTE — Telephone Encounter (Signed)
Patient requested refill on Taltz.  Last Fill: 05/07/2022  Labs: 07/20/2022 CBC and CMP are stable.   TB Gold: 06/12/2021 Neg (updated 07/20/2022 results pending)   Next Visit: 08/12/2022  Last Visit: 05/12/2022  [id number] arthritis   Current Dose per office note 05/12/2022: Taltz-initiated on 02/10/2022   Okay to refill Taltz?

## 2022-07-21 NOTE — Progress Notes (Signed)
CBC and CMP are stable.

## 2022-07-22 LAB — COMPLETE METABOLIC PANEL WITH GFR
AG Ratio: 1.2 (calc) (ref 1.0–2.5)
ALT: 17 U/L (ref 6–29)
AST: 18 U/L (ref 10–35)
Albumin: 4.1 g/dL (ref 3.6–5.1)
Alkaline phosphatase (APISO): 84 U/L (ref 37–153)
BUN/Creatinine Ratio: 39 (calc) — ABNORMAL HIGH (ref 6–22)
BUN: 26 mg/dL — ABNORMAL HIGH (ref 7–25)
CO2: 29 mmol/L (ref 20–32)
Calcium: 9.5 mg/dL (ref 8.6–10.4)
Chloride: 102 mmol/L (ref 98–110)
Creat: 0.67 mg/dL (ref 0.50–1.03)
Globulin: 3.3 g/dL (calc) (ref 1.9–3.7)
Glucose, Bld: 87 mg/dL (ref 65–99)
Potassium: 4.4 mmol/L (ref 3.5–5.3)
Sodium: 140 mmol/L (ref 135–146)
Total Bilirubin: 0.3 mg/dL (ref 0.2–1.2)
Total Protein: 7.4 g/dL (ref 6.1–8.1)
eGFR: 103 mL/min/{1.73_m2} (ref >=60)

## 2022-07-22 LAB — CBC WITH DIFFERENTIAL/PLATELET
Absolute Monocytes: 681 cells/uL (ref 200–950)
Basophils Absolute: 37 cells/uL (ref 0–200)
Basophils Relative: 0.5 %
Eosinophils Absolute: 52 cells/uL (ref 15–500)
Eosinophils Relative: 0.7 %
HCT: 40.7 % (ref 35.0–45.0)
Hemoglobin: 12.9 g/dL (ref 11.7–15.5)
Lymphs Abs: 2782 cells/uL (ref 850–3900)
MCH: 26.2 pg — ABNORMAL LOW (ref 27.0–33.0)
MCHC: 31.7 g/dL — ABNORMAL LOW (ref 32.0–36.0)
MCV: 82.6 fL (ref 80.0–100.0)
MPV: 9.3 fL (ref 7.5–12.5)
Monocytes Relative: 9.2 %
Neutro Abs: 3848 cells/uL (ref 1500–7800)
Neutrophils Relative %: 52 %
Platelets: 402 10*3/uL — ABNORMAL HIGH (ref 140–400)
RBC: 4.93 10*6/uL (ref 3.80–5.10)
RDW: 14.2 % (ref 11.0–15.0)
Total Lymphocyte: 37.6 %
WBC: 7.4 10*3/uL (ref 3.8–10.8)

## 2022-07-22 LAB — QUANTIFERON-TB GOLD PLUS
Mitogen-NIL: 9.25 IU/mL
NIL: 0.01 IU/mL
QuantiFERON-TB Gold Plus: NEGATIVE
TB1-NIL: 0.01 IU/mL
TB2-NIL: 0 IU/mL

## 2022-07-23 NOTE — Progress Notes (Signed)
TB gold negative

## 2022-07-29 NOTE — Progress Notes (Signed)
Office Visit Note  Patient: Tiffany Wilkins             Date of Birth: Mar 26, 1965           MRN: [id number]             PCP: Laurena Bering, DO Referring: Laurena Bering, DO Visit Date: 08/12/2022 Occupation: @GUAROCC @  Subjective:  Medication monitoring   History of Present Illness: Tiffany Wilkins is a 57 y.o. female with history of psoriatic arthritis. Patient is currently taking Taltz-initiated on 02/10/2022, Otrexup 20 mg sq injections once weekly, and folic acid 2 mg daily.  She is tolerating combination therapy without any side effects or injection site reactions.  She denies any active psoriasis at this time.  She denies any Achilles tendinitis or plantar fasciitis.  She denies any SI joint pain.  She continues to have intermittent arthralgias and joint stiffness.  She notices swelling and tightness in both her hands and lower legs consistent with edema.  Patient states that she has an appointment today with a wellness clinic to discuss her increased weight gain.    Activities of Daily Living:  Patient reports joint stiffness all day  Patient Reports nocturnal pain.  Difficulty dressing/grooming: Reports Difficulty climbing stairs: Denies Difficulty getting out of chair: Denies Difficulty using hands for taps, buttons, cutlery, and/or writing: Reports  Review of Systems  Constitutional:  Positive for fatigue.  HENT:  Negative for mouth sores and mouth dryness.   Eyes:  Negative for dryness.  Respiratory:  Negative for shortness of breath.   Cardiovascular:  Negative for chest pain and palpitations.  Gastrointestinal:  Positive for blood in stool and constipation. Negative for diarrhea.  Endocrine: Positive for increased urination.  Genitourinary:  Negative for involuntary urination.  Musculoskeletal:  Positive for joint pain, joint pain, joint swelling and morning stiffness. Negative for gait problem, myalgias, muscle weakness, muscle tenderness and myalgias.  Skin:   Positive for hair loss and sensitivity to sunlight. Negative for color change and rash.  Allergic/Immunologic: Negative for susceptible to infections.  Neurological:  Negative for dizziness and headaches.  Hematological:  Negative for swollen glands.  Psychiatric/Behavioral:  Negative for depressed mood and sleep disturbance. The patient is not nervous/anxious.     PMFS History:  Patient Active Problem List   Diagnosis Date Noted   Primary osteoarthritis of both knees 12/11/2016   High risk medication use 06/30/2016   Right hand pain 06/30/2016   Right hip pain 06/30/2016   Hepatic steatosis 06/27/2014   BMI 50.0-59.9, adult (HCC) 06/27/2014   Fibroid, uterine 06/27/2014   Abdominal pain, chronic, right lower quadrant 06/27/2014   Obesity hypoventilation syndrome (HCC) 12/08/2013   Nocturnal seizures (HCC) 12/08/2013   OSA (obstructive sleep apnea) 12/08/2013   Noncompliance with CPAP treatment 12/08/2013   Psoriasis 11/29/2013   Seizure disorder, complex partial (HCC) 06/01/2012   Migraine 06/01/2012   OSA on CPAP 01/04/2012   Hiatal hernia 03/26/2011   Internal hemorrhoid 03/24/2011   Reflux esophagitis 03/24/2011   Psoriatic arthritis (HCC) 01/22/2011   Hernia of abdominal wall 01/22/2011    Past Medical History:  Diagnosis Date   Allergic rhinitis, cause unspecified    Anal fissure    Arthritis    inflammatory and psoriatic--Dr. Corliss Skains   Asthma    related to allergies and colds   Chronic bronchitis    gets bronchitis yearly with colds   Chronic kidney disease kidney stones   Fracture    stress  fracture right foot   GERD (gastroesophageal reflux disease)    Hernia (acquired) (recurrent)    R lower abdomen; has seen [facility] (Dr. Ephriam Knuckles and recurred   Hiatal hernia 03/2011   Impaired fasting glucose    Internal hemorrhoids 03/2011   Menstrual migraine    Dr, Anne Hahn   Obesity    Reflux esophagitis 03/2011   Seizure disorder (HCC)    f/b Dr. Anne Hahn    Seizures [facility]) last seisure june 2012   Sleep apnea    inconclusive test per pt (some central and obstructive component)   Sleep apnea 06/07/09   mild complex sleep apnea, worse in supine position (uses CPAP intermittently only)   Tinea cruris 7/09    Family History  Problem Relation Age of Onset   Arthritis Mother        rheumatoid   Hyperlipidemia Mother    Hypertension Mother    Hyperthyroidism Mother        s/p thyroidectomy, now with hypothyroidism   Cancer Father 63       AML, lung cancer, kidney cancer (all presented at same time)   Diabetes Father    Hypertension Father    Hyperlipidemia Father    Psoriasis Father    Anxiety disorder Father        OCD, nervous breakdown   Psoriasis Sister    Irritable bowel syndrome Sister    Arthritis Sister        psoriatic   Diabetes Sister    Stroke Maternal Aunt    Diabetes Paternal Uncle    Crohn's disease Paternal Uncle    Cancer Paternal Uncle        ? type   Colitis Paternal Uncle    Asthma Maternal Grandmother    Hyperlipidemia Maternal Grandmother    Heart disease Maternal Grandmother    Stroke Maternal Grandmother    Diabetes Maternal Grandfather    Heart disease Maternal Grandfather    Cancer Paternal Grandmother 48       female (?ovarian)   Diabetes Paternal Grandmother    Hypertension Paternal Grandmother    Diabetes Paternal Grandfather    Heart disease Paternal Grandfather    Hypertension Paternal Grandfather    GER disease Daughter    Asthma Son    Stroke Cousin    Turner syndrome Niece    Esophageal cancer Neg Hx    Stomach cancer Neg Hx    Past Surgical History:  Procedure Laterality Date   BARIATRIC SURGERY  09/22/2017   CESAREAN SECTION     x2   CHOLECYSTECTOMY  2005   COLONOSCOPY  03/19/11   Dr. Juanda Chance; internal hemorrhoids   ESOPHAGOGASTRODUODENOSCOPY  03/19/11   hiatal hernia, esophagitis   KIDNEY STONE SURGERY  08/2010   retrieved with basket (at [facility])   repair of incisional hernia   2005, 2008   related to laparoscopic cholecystectomy   Social History   Social History Narrative   Patient Lives with husband Tiffany Wilkins)  and 2 children (son and daughter, 45, 85)   Patient is right handed.   Patient drinks 2 cups daily- coffee/soda            Immunization History  Administered Date(s) Administered   Influenza Split 12/29/2010   Influenza,inj,Quad PF,6+ Mos 11/29/2013   Tdap 11/29/2013     Objective: Vital Signs: BP 138/85 (BP Location: Left Arm, Patient Position: Sitting, Cuff Size: Normal)   Pulse 64   Resp 16   Ht 5\' 2"  (1.575  m)   Wt 280 lb (127 kg)   LMP 06/12/2014   BMI 51.21 kg/m    Physical Exam Vitals and nursing note reviewed.  Constitutional:      Appearance: She is well-developed.  HENT:     Head: Normocephalic and atraumatic.  Eyes:     Conjunctiva/sclera: Conjunctivae normal.  Cardiovascular:     Rate and Rhythm: Normal rate and regular rhythm.     Heart sounds: Normal heart sounds.  Pulmonary:     Effort: Pulmonary effort is normal.     Breath sounds: Normal breath sounds.  Abdominal:     General: Bowel sounds are normal.     Palpations: Abdomen is soft.  Musculoskeletal:     Cervical back: Normal range of motion.     Right lower leg: Edema present.     Left lower leg: Edema present.  Skin:    General: Skin is warm and dry.     Capillary Refill: Capillary refill takes less than 2 seconds.  Neurological:     Mental Status: She is alert and oriented to person, place, and time.  Psychiatric:        Behavior: Behavior normal.      Musculoskeletal Exam: C-spine, thoracic spine, and lumbar spine good ROM.  Shoulder joints and elbow joints have good ROM.  Limited extension of both wrists, right >left.  Thickening and mild inflammation of the right 3rd PIP joint.  Edema noted in both hands.  Hip joints have good ROM with no groin pain.  Knee joints have good ROM with no warmth or effusion.  Pedal edema noted in bilateral lower legs and  feet.   CDAI Exam: CDAI Score: -- Patient Global: --; Provider Global: -- Swollen: 1 ; Tender: 1  Joint Exam 08/12/2022      Right  Left  PIP 3  Swollen Tender        Investigation: No additional findings.  Imaging: No results found.  Recent Labs: Lab Results  Component Value Date   WBC 7.4 07/20/2022   HGB 12.9 07/20/2022   PLT 402 (H) 07/20/2022   NA 140 07/20/2022   K 4.4 07/20/2022   CL 102 07/20/2022   CO2 29 07/20/2022   GLUCOSE 87 07/20/2022   BUN 26 (H) 07/20/2022   CREATININE 0.67 07/20/2022   BILITOT 0.3 07/20/2022   ALKPHOS 75 10/16/2016   AST 18 07/20/2022   ALT 17 07/20/2022   PROT 7.4 07/20/2022   ALBUMIN 4.2 10/16/2016   CALCIUM 9.5 07/20/2022   GFRAA 114 09/18/2020   QFTBGOLDPLUS NEGATIVE 07/20/2022    Speciality Comments: TB Gold: 10/16/16 Neg Failed Cosentyx-developed rash that sent her to the ER Does not want to try TNF-inhibitor due to neurologic risk since she has epilepsy  Procedures:  No procedures performed Allergies: Cosentyx [secukinumab], Hydrocodone, Vimpat [lacosamide], Amoxicillin, Codeine, Keppra [levetiracetam], Sulfa antibiotics, Transderm-scop [scopolamine], and Tape   Assessment / Plan:     Visit Diagnoses: Psoriatic arthritis (HCC): Thickening and mild inflammation was noted in the right third PIP joint.  She does not have any other synovitis or dactylitis on examination today.  No SI joint pain.  No evidence of Achilles tendinitis or plantar fasciitis.  No active psoriasis at this time.  She is currently on Taltz 80 mg subcu days injections once monthly along with Otrexup 20 mg subcu days injections once weekly.  She is tolerating combination therapy without any side effects or injection site reactions.  Her joint inflammation has improved significantly  on combination therapy.  She continues to have edema in both hands and her lower legs and was advised to follow-up with her PCP for further evaluation and management of edema.  She  will remain on Toltz and Otrexup as prescribed.  She was advised to notify us if she develops signs or symptoms of a flare.  She will follow-up in the office in 3 months or sooner if needed.  Psoriasis: No active psoriasis at this time.  No medication changes will be made at this time.   High risk medication use - Taltz-initiated on 02/10/2022, Otrexup 20 mg sq injections once weekly, and folic acid 2 mg daily.   Inadequate response to Simponi.  D/c Cosentyx due to rash. CBC and CMP updated on 07/20/22. Her next lab work will be due in August and every 3 months.  TB gold negative on 07/20/22.   Discussed the importance of holding Taltz and otrexup if she develops signs or symptoms of an infection and to resume once the infection has completely cleared.   HLA B27 positive  Piriformis syndrome, left: Resolved.   Pain in both hands - XR of both hands were updated on 11/26/2021 which were consistent w/ severe erosive psoriatic arthritis w/ radiographic progression compared to x-rays from 2021.  Edema noted in both hands.  Thickening and mild inflammation noted in the right third PIP joint.  No dactylitis noted.  Limited extension of both wrist joints, right greater than left.  Chronic pain of right knee - X-rays of the right knee were consistent with moderate chondromalacia patella with patellar erosion on 11/26/2021.  No warmth or effusion noted today.  Primary osteoarthritis of both feet: She is not experiencing any increased discomfort in her feet at this time.  Pedal edema noted bilaterally.  Discussed the importance of wearing compression.  Advised patient to follow-up with PCP for further evaluation and management of edema.  Other medical conditions are listed as follows:   History of sleep apnea  Skin yeast infection  History of asthma  History of epilepsy  History of hernia repair  History of cholecystectomy  History of gastric bypass  Family history of rheumatoid  arthritis  Generalized edema  Orders: No orders of the defined types were placed in this encounter.  No orders of the defined types were placed in this encounter.   Follow-Up Instructions: Return in about 3 months (around 11/12/2022) for Psoriatic arthritis.   Gearldine Bienenstock, PA-C  Note - This record has been created using Dragon software.  Chart creation errors have been sought, but may not always  have been located. Such creation errors do not reflect on  the standard of medical care.

## 2022-08-12 ENCOUNTER — Encounter: Payer: Self-pay | Admitting: Physician Assistant

## 2022-08-12 ENCOUNTER — Ambulatory Visit: Payer: BC Managed Care – PPO | Attending: Physician Assistant | Admitting: Physician Assistant

## 2022-08-12 VITALS — BP 138/85 | HR 64 | Resp 16 | Ht 62.0 in | Wt 280.0 lb

## 2022-08-12 DIAGNOSIS — Z8261 Family history of arthritis: Secondary | ICD-10-CM

## 2022-08-12 DIAGNOSIS — L4050 Arthropathic psoriasis, unspecified: Secondary | ICD-10-CM

## 2022-08-12 DIAGNOSIS — M79642 Pain in left hand: Secondary | ICD-10-CM

## 2022-08-12 DIAGNOSIS — L409 Psoriasis, unspecified: Secondary | ICD-10-CM

## 2022-08-12 DIAGNOSIS — Z79899 Other long term (current) drug therapy: Secondary | ICD-10-CM

## 2022-08-12 DIAGNOSIS — Z9889 Other specified postprocedural states: Secondary | ICD-10-CM

## 2022-08-12 DIAGNOSIS — Z8669 Personal history of other diseases of the nervous system and sense organs: Secondary | ICD-10-CM

## 2022-08-12 DIAGNOSIS — M19072 Primary osteoarthritis, left ankle and foot: Secondary | ICD-10-CM

## 2022-08-12 DIAGNOSIS — G8929 Other chronic pain: Secondary | ICD-10-CM

## 2022-08-12 DIAGNOSIS — G5702 Lesion of sciatic nerve, left lower limb: Secondary | ICD-10-CM

## 2022-08-12 DIAGNOSIS — Z8719 Personal history of other diseases of the digestive system: Secondary | ICD-10-CM

## 2022-08-12 DIAGNOSIS — Z1589 Genetic susceptibility to other disease: Secondary | ICD-10-CM

## 2022-08-12 DIAGNOSIS — M79641 Pain in right hand: Secondary | ICD-10-CM

## 2022-08-12 DIAGNOSIS — M25561 Pain in right knee: Secondary | ICD-10-CM

## 2022-08-12 DIAGNOSIS — R635 Abnormal weight gain: Secondary | ICD-10-CM | POA: Diagnosis not present

## 2022-08-12 DIAGNOSIS — Z8709 Personal history of other diseases of the respiratory system: Secondary | ICD-10-CM

## 2022-08-12 DIAGNOSIS — K5900 Constipation, unspecified: Secondary | ICD-10-CM | POA: Diagnosis not present

## 2022-08-12 DIAGNOSIS — Z9884 Bariatric surgery status: Secondary | ICD-10-CM

## 2022-08-12 DIAGNOSIS — Z9049 Acquired absence of other specified parts of digestive tract: Secondary | ICD-10-CM

## 2022-08-12 DIAGNOSIS — R601 Generalized edema: Secondary | ICD-10-CM

## 2022-08-12 DIAGNOSIS — R638 Other symptoms and signs concerning food and fluid intake: Secondary | ICD-10-CM | POA: Diagnosis not present

## 2022-08-12 DIAGNOSIS — B372 Candidiasis of skin and nail: Secondary | ICD-10-CM

## 2022-08-12 DIAGNOSIS — M19071 Primary osteoarthritis, right ankle and foot: Secondary | ICD-10-CM

## 2022-08-12 NOTE — Patient Instructions (Signed)
Standing Labs We placed an order today for your standing lab work.   Please have your standing labs drawn in August and every 3 months   Please have your labs drawn 2 weeks prior to your appointment so that the provider can discuss your lab results at your appointment, if possible.  Please note that you may see your imaging and lab results in MyChart before we have reviewed them. We will contact you once all results are reviewed. Please allow our office up to 72 hours to thoroughly review all of the results before contacting the office for clarification of your results.  WALK-IN LAB HOURS  Monday through Thursday from 8:00 am -12:30 pm and 1:00 pm-5:00 pm and Friday from 8:00 am-12:00 pm.  Patients with office visits requiring labs will be seen before walk-in labs.  You may encounter longer than normal wait times. Please allow additional time. Wait times may be shorter on  Monday and Thursday afternoons.  We do not book appointments for walk-in labs. We appreciate your patience and understanding with our staff.   Labs are drawn by [facility]. Please bring your co-pay at the time of your lab draw.  You may receive a bill from [facility] for your lab work.  Please note if you are on Hydroxychloroquine and and an order has been placed for a Hydroxychloroquine level,  you will need to have it drawn 4 hours or more after your last dose.  If you wish to have your labs drawn at another location, please call the office 24 hours in advance so we can fax the orders.  The office is located at [address], [address], [place], [place] [address]   If you have any questions regarding directions or hours of operation,  please call [phone].   As a reminder, please drink plenty of water prior to coming for your lab work. Thanks!  

## 2022-08-14 ENCOUNTER — Other Ambulatory Visit: Payer: Self-pay | Admitting: Physician Assistant

## 2022-08-14 DIAGNOSIS — L4050 Arthropathic psoriasis, unspecified: Secondary | ICD-10-CM

## 2022-08-14 NOTE — Telephone Encounter (Signed)
Last Fill: 05/29/2022  Labs: 07/20/2022 CBC and CMP are stable.   Next Visit: 11/12/2022  Last Visit: 08/12/2022  DX: Psoriatic arthritis   Current Dose per office note 08/12/2022: Otrexup 20 mg sq injections once weekly   Okay to refill Otrexup?

## 2022-08-17 DIAGNOSIS — E559 Vitamin D deficiency, unspecified: Secondary | ICD-10-CM | POA: Diagnosis not present

## 2022-08-17 DIAGNOSIS — R638 Other symptoms and signs concerning food and fluid intake: Secondary | ICD-10-CM | POA: Diagnosis not present

## 2022-08-17 DIAGNOSIS — E669 Obesity, unspecified: Secondary | ICD-10-CM | POA: Diagnosis not present

## 2022-08-17 DIAGNOSIS — D519 Vitamin B12 deficiency anemia, unspecified: Secondary | ICD-10-CM | POA: Diagnosis not present

## 2022-08-17 DIAGNOSIS — R7982 Elevated C-reactive protein (CRP): Secondary | ICD-10-CM | POA: Diagnosis not present

## 2022-08-17 DIAGNOSIS — R7989 Other specified abnormal findings of blood chemistry: Secondary | ICD-10-CM | POA: Diagnosis not present

## 2022-08-17 DIAGNOSIS — N951 Menopausal and female climacteric states: Secondary | ICD-10-CM | POA: Diagnosis not present

## 2022-08-17 DIAGNOSIS — E8889 Other specified metabolic disorders: Secondary | ICD-10-CM | POA: Diagnosis not present

## 2022-08-18 DIAGNOSIS — Z9884 Bariatric surgery status: Secondary | ICD-10-CM | POA: Diagnosis not present

## 2022-08-18 DIAGNOSIS — L4050 Arthropathic psoriasis, unspecified: Secondary | ICD-10-CM | POA: Diagnosis not present

## 2022-09-02 DIAGNOSIS — K5900 Constipation, unspecified: Secondary | ICD-10-CM | POA: Diagnosis not present

## 2022-09-02 DIAGNOSIS — E559 Vitamin D deficiency, unspecified: Secondary | ICD-10-CM | POA: Diagnosis not present

## 2022-09-02 DIAGNOSIS — R638 Other symptoms and signs concerning food and fluid intake: Secondary | ICD-10-CM | POA: Diagnosis not present

## 2022-09-02 DIAGNOSIS — E669 Obesity, unspecified: Secondary | ICD-10-CM | POA: Diagnosis not present

## 2022-09-14 DIAGNOSIS — E669 Obesity, unspecified: Secondary | ICD-10-CM | POA: Diagnosis not present

## 2022-09-14 DIAGNOSIS — Z6841 Body Mass Index (BMI) 40.0 and over, adult: Secondary | ICD-10-CM | POA: Diagnosis not present

## 2022-09-14 DIAGNOSIS — E559 Vitamin D deficiency, unspecified: Secondary | ICD-10-CM | POA: Diagnosis not present

## 2022-09-14 DIAGNOSIS — K5900 Constipation, unspecified: Secondary | ICD-10-CM | POA: Diagnosis not present

## 2022-09-28 DIAGNOSIS — E6609 Other obesity due to excess calories: Secondary | ICD-10-CM | POA: Diagnosis not present

## 2022-09-28 DIAGNOSIS — R7303 Prediabetes: Secondary | ICD-10-CM | POA: Diagnosis not present

## 2022-09-28 DIAGNOSIS — R638 Other symptoms and signs concerning food and fluid intake: Secondary | ICD-10-CM | POA: Diagnosis not present

## 2022-09-28 DIAGNOSIS — I2510 Atherosclerotic heart disease of native coronary artery without angina pectoris: Secondary | ICD-10-CM | POA: Diagnosis not present

## 2022-10-06 NOTE — Progress Notes (Addendum)
Reason for visit: Seizures, headache  Chief Complaint  Patient presents with   Follow-up    Pt alone, rm 7, she overall is stable and doing well. Denies seizures would like discuss potentially weaning off. She is seeing a weight management specialist.      History of present illness:  [name] is a 57 year old right-handed white female with a history of well-controlled seizures and migraine headaches.  Prior patient of Dr. Anne Hahn, now patient of Dr. Marjory Lies.   Returns today, 10/07/2022, for routine follow-up visit.  She has been doing well since prior visit. Reports 1 migraine about every other month, usually triggered by stress or prolonged screen time.  She has not had any recent seizure activity.  She questions coming off the Trokendi as she is trying to lessen amount of daily medications. She has missed some dosages unintentionally without any increased migraines or seizure activity.  She has no difficulty tolerating.   Per epic review, last seizure aura in 2019 during increased stress and missed several doses of medication. Last documented seizure activity in 2016 associated with dj vu sensation prior to seizure activity.   Reports if she gets too "excited" or over thinks the dj vu sensation, she will start to get nauseous, tongue will tingle, will get dizzy and then black out. If able to not think about the dj vu sensation, she is usually able to avoid further symptoms and avoid blacking out. Previously seizures occurring mostly at night. She is aware of typical seizure triggers including lack of sleep and usually after surgical procedure such as prior C-sections and hernia repair.    She is currently working with holistic provider and recently started on a weight loss medication.       Past Medical History:  Diagnosis Date   Allergic rhinitis, cause unspecified    Anal fissure    Arthritis    inflammatory and psoriatic--Dr. Corliss Skains   Asthma    related to  allergies and colds   Chronic bronchitis    gets bronchitis yearly with colds   Chronic kidney disease kidney stones   Fracture    stress fracture right foot   GERD (gastroesophageal reflux disease)    Hernia (acquired) (recurrent)    R lower abdomen; has seen [facility] (Dr. Ephriam Knuckles and recurred   Hiatal hernia 03/2011   Impaired fasting glucose    Internal hemorrhoids 03/2011   Menstrual migraine    Dr, Anne Hahn   Obesity    Reflux esophagitis 03/2011   Seizure disorder (HCC)    f/b Dr. Anne Hahn   Seizures [facility]) last seisure june 2012   Sleep apnea    inconclusive test per pt (some central and obstructive component)   Sleep apnea 06/07/09   mild complex sleep apnea, worse in supine position (uses CPAP intermittently only)   Tinea cruris 7/09    Past Surgical History:  Procedure Laterality Date   BARIATRIC SURGERY  09/22/2017   CESAREAN SECTION     x2   CHOLECYSTECTOMY  2005   COLONOSCOPY  03/19/11   Dr. Juanda Chance; internal hemorrhoids   ESOPHAGOGASTRODUODENOSCOPY  03/19/11   hiatal hernia, esophagitis   KIDNEY STONE SURGERY  08/2010   retrieved with basket (at [facility])   repair of incisional hernia  2005, 2008   related to laparoscopic cholecystectomy    Family History  Problem Relation Age of Onset   Arthritis Mother        rheumatoid   Hyperlipidemia Mother  Hypertension Mother    Hyperthyroidism Mother        s/p thyroidectomy, now with hypothyroidism   Cancer Father 19       AML, lung cancer, kidney cancer (all presented at same time)   Diabetes Father    Hypertension Father    Hyperlipidemia Father    Psoriasis Father    Anxiety disorder Father        OCD, nervous breakdown   Psoriasis Sister    Irritable bowel syndrome Sister    Arthritis Sister        psoriatic   Diabetes Sister    Stroke Maternal Aunt    Diabetes Paternal Uncle    Crohn's disease Paternal Uncle    Cancer Paternal Uncle        ? type   Colitis Paternal Uncle    Asthma Maternal  Grandmother    Hyperlipidemia Maternal Grandmother    Heart disease Maternal Grandmother    Stroke Maternal Grandmother    Diabetes Maternal Grandfather    Heart disease Maternal Grandfather    Cancer Paternal Grandmother 52       female (?ovarian)   Diabetes Paternal Grandmother    Hypertension Paternal Grandmother    Diabetes Paternal Grandfather    Heart disease Paternal Grandfather    Hypertension Paternal Grandfather    GER disease Daughter    Asthma Son    Stroke Cousin    Turner syndrome Niece    Esophageal cancer Neg Hx    Stomach cancer Neg Hx     Social history:  reports that she has never smoked. She has never been exposed to tobacco smoke. She has never used smokeless tobacco. She reports that she does not currently use alcohol. She reports that she does not use drugs.    Allergies  Allergen Reactions   Cosentyx [Secukinumab] Anaphylaxis   Hydrocodone Itching and Palpitations   Vimpat [Lacosamide] Itching    Itching   Amoxicillin Hives   Codeine Itching and Other (See Comments)    Heart races.   Keppra [Levetiracetam]     Suicide ideation   Sulfa Antibiotics Nausea Only   Transderm-Scop [Scopolamine] Other (See Comments)    Stopped breathing.   Tape Rash    prefere paper tape    Medications:  Current Outpatient Medications on File Prior to Visit  Medication Sig Dispense Refill   acetaminophen (TYLENOL) 500 MG tablet as needed.      albuterol (PROVENTIL HFA;VENTOLIN HFA) 108 (90 BASE) MCG/ACT inhaler Inhale 2 puffs into the lungs as needed.     Cholecalciferol (VITAMIN D3) 1000 units CAPS Take by mouth.     clobetasol cream (TEMOVATE) 0.05 % Apply 1 Application topically 2 (two) times daily. 45 g 0   Cyanocobalamin (VITAMIN B-12 PO) Take by mouth daily.     diclofenac sodium (VOLTAREN) 1 % GEL Apply 3 g to 3 large joints up to 3 times daily. 3 Tube 3   Emollient (DHEA EX) Apply topically.     fluconazole (DIFLUCAN) 150 MG tablet Take 1 tablet (150 mg) by  mouth every 72 hours for recurrent yeast infection. 5 tablet 0   folic acid (FOLVITE) 1 MG tablet TAKE 2 TABLETS DAILY 180 tablet 3   furosemide (LASIX) 20 MG tablet Take 20 mg by mouth daily as needed.     ibuprofen (ADVIL,MOTRIN) 200 MG tablet Take 400 mg by mouth every 6 (six) hours as needed.     Ixekizumab (TALTZ) 80 MG/ML SOAJ Inject 80  mg into the skin every 28 (twenty-eight) days. 1 mL 2   Meth-Inos-Chol-Aden-Lcarn-B12 (MIC COMBO IM) Inject into the muscle once a week. MIC SHOT     Methotrexate, PF, (OTREXUP) 20 MG/0.4ML SOAJ Inject 20 mg into the skin once a week.     Multiple Minerals-Vitamins (CALCIUM-MAGNESIUM-ZINC-D3) TABS Take 2 tablets by mouth daily.     NALTREXONE HCL PO Take 6 mg by mouth 2 (two) times daily.     OMEPRAZOLE PO Take by mouth daily.     No current facility-administered medications on file prior to visit.      ROS:  Out of a complete 14 system review of symptoms, the patient complains only of the following symptoms, and all other reviewed systems are negative.  History of seizures Migraine headaches Weight gain     Objective: Today's Vitals   10/07/22 0925  BP: 115/75  Pulse: 63  Weight: 275 lb (124.7 kg)  Height: 5\' 2"  (1.575 m)   Body mass index is 50.3 kg/m.   General: well developed, well nourished, very pleasant middle-age Caucasian female, seated, in no evident distress Head: head normocephalic and atraumatic.   Neck: supple with no carotid or supraclavicular bruits Cardiovascular: regular rate and rhythm, no murmurs Musculoskeletal: limited flexion of hands bilaterally d/t psoriatic arthritis Skin:  no rash/petichiae Vascular:  Normal pulses all extremities   Neurologic Exam Mental Status: Awake and fully alert. Oriented to place and time. Recent and remote memory intact. Attention span, concentration and fund of knowledge appropriate. Mood and affect appropriate.  Cranial Nerves: Pupils equal, briskly reactive to light.  Extraocular movements full without nystagmus. Visual fields full to confrontation. Hearing intact. Facial sensation intact. Face, tongue, palate moves normally and symmetrically.  Motor: Normal bulk and tone. Normal strength in all tested extremity muscles Sensory.: intact to touch , pinprick , position and vibratory sensation.  Coordination: Rapid alternating movements normal in all extremities. Finger-to-nose and heel-to-shin performed accurately bilaterally. Gait and Station: Arises from chair without difficulty. Stance is wide based. Gait demonstrates normal stride length and balance without use of assistive device.  Reflexes: 1+ and symmetric. Toes downgoing.       Assessment/Plan:  1.  History of seizures, well controlled  2.  Migraine headache   Patient questions coming off of Trokendi as no actual seizure since 2016 and seizure aura in 2019.  She would like to lessen pill burden but otherwise tolerating well.  She is also on this for migraine headaches.  Advised her we can try to taper off but she may have recurrent seizure and per [place] law no driving for 6 months although she does have auras prior to seizure activity and has known triggers. Will further discuss this with out epileptologist.  In the mean time, she would like to continue on Trokendi but will decrease dose down from 150mg  to 100mg  nightly. She was advised to restart 150mg  dosing with any worsening migraine headaches or seizure activity and to call office.    ADDENDUM 11/04/2022 JM: Further discussed weaning completely off Trokendi and driving restriction with our epileptologist. Even though patient has known seizure triggers and aura prior to seizure activity, she will still have to refrain from driving for 6 months if taken off Trokendi per [place] law. This was discussed with patient, as she is currently working, she would not be able to stop driving for 6 months. At prior visit, discussed at least decreasing Trokendi dose down  to 100 mg nightly but has not yet  done this as she was waiting for call back regarding driving restrictions. Advised she can try to lower dose of Trokendi to 100mg  nightly if she wishes but I would not recommend further reducing dose due to potential of breakthrough seizure. She is still able to drive with this slight dose reduction.  She is wanting to try lower dose. Advised to monitor for any type of seizure activity or worsening migraines and to resume 150 mg dosing and notify office if this should occur.  She verbalized understanding and appreciative of call.   Follow up in 1 year or call earlier if needed    CC:  Tonny Bollman L, DO   I spent 31 minutes of face-to-face and non-face-to-face time with patient.  This included previsit chart review, lab review, study review, order entry, electronic health record documentation, patient education and discussion regarding above diagnoses and treatment plan and answered all other questions to patient's satisfaction  Ihor Austin, [facility]  [facility] [address] [place], [place] [address]  Phone [phone] Fax [phone] Note: This document was prepared with digital dictation and possible smart phrase technology. Any transcriptional errors that result from this process are unintentional.

## 2022-10-07 ENCOUNTER — Encounter: Payer: Self-pay | Admitting: Adult Health

## 2022-10-07 ENCOUNTER — Ambulatory Visit: Payer: BC Managed Care – PPO | Admitting: Adult Health

## 2022-10-07 VITALS — BP 115/75 | HR 63 | Ht 62.0 in | Wt 275.0 lb

## 2022-10-07 DIAGNOSIS — G43009 Migraine without aura, not intractable, without status migrainosus: Secondary | ICD-10-CM

## 2022-10-07 DIAGNOSIS — G40209 Localization-related (focal) (partial) symptomatic epilepsy and epileptic syndromes with complex partial seizures, not intractable, without status epilepticus: Secondary | ICD-10-CM

## 2022-10-07 MED ORDER — TOPIRAMATE ER 50 MG PO CAP24: 2.0000 | ORAL_CAPSULE | Freq: Every day | ORAL | 3 refills | Status: DC

## 2022-10-07 NOTE — Patient Instructions (Addendum)
Recommend trying to decrease Trokendi down to 100mg  nightly - please restart 150mg  dosage with any worsening migraines  Please call with any seizure activity     Follow up in 1 year or call earlier if needed

## 2022-10-19 DIAGNOSIS — E559 Vitamin D deficiency, unspecified: Secondary | ICD-10-CM | POA: Diagnosis not present

## 2022-10-19 DIAGNOSIS — E669 Obesity, unspecified: Secondary | ICD-10-CM | POA: Diagnosis not present

## 2022-10-19 DIAGNOSIS — Z6841 Body Mass Index (BMI) 40.0 and over, adult: Secondary | ICD-10-CM | POA: Diagnosis not present

## 2022-10-29 NOTE — Progress Notes (Unsigned)
Office Visit Note  Patient: Tiffany Wilkins             Date of Birth: Jul 15, 1965           MRN: [id number]             PCP: Laurena Bering, DO Referring: Laurena Bering, DO Visit Date: 11/12/2022 Occupation: @GUAROCC @  Subjective:  Medication monitoring   History of Present Illness: Tiffany Wilkins is a 57 y.o. female with psoriatic arthritis and osteoarthritis.  Patient remains on Taltz-initiated on 02/10/2022, Otrexup 20 mg sq injections once weekly, and folic acid 2 mg daily.  She is tolerating combination therapy without any side effects.  She states she was recently overdue for both injections by about 1 week due to recently having a gastrointestinal infection.  Patient states that she was experiencing increased joint stiffness and inflammation since she was overdue for her medications but has gradually started to notice improvement.  Overall her symptoms remain stable on combination therapy.  Patient states that she has been working on weight loss and is currently prescribed naltrexone for weight loss.    Activities of Daily Living:  Patient reports morning stiffness for all day. Patient Reports nocturnal pain.  Difficulty dressing/grooming: Reports Difficulty climbing stairs: Reports Difficulty getting out of chair: Reports Difficulty using hands for taps, buttons, cutlery, and/or writing: Reports  Review of Systems  Constitutional:  Positive for fatigue.  HENT:  Negative for mouth sores and mouth dryness.   Eyes:  Positive for discharge, itching and dryness.  Respiratory:  Negative for shortness of breath.   Cardiovascular:  Negative for chest pain and palpitations.  Gastrointestinal:  Positive for constipation and diarrhea. Negative for blood in stool.  Endocrine: Negative for increased urination.  Genitourinary:  Positive for involuntary urination.  Musculoskeletal:  Positive for joint pain, gait problem, joint pain, joint swelling and morning stiffness. Negative for  myalgias, muscle weakness, muscle tenderness and myalgias.  Skin:  Positive for rash. Negative for color change, hair loss and sensitivity to sunlight.  Allergic/Immunologic: Positive for susceptible to infections.  Neurological:  Negative for dizziness and headaches.  Hematological:  Negative for swollen glands.  Psychiatric/Behavioral:  Positive for sleep disturbance. Negative for depressed mood. The patient is not nervous/anxious.     PMFS History:  Patient Active Problem List   Diagnosis Date Noted   Primary osteoarthritis of both knees 12/11/2016   High risk medication use 06/30/2016   Right hand pain 06/30/2016   Right hip pain 06/30/2016   Hepatic steatosis 06/27/2014   BMI 50.0-59.9, adult (HCC) 06/27/2014   Fibroid, uterine 06/27/2014   Abdominal pain, chronic, right lower quadrant 06/27/2014   Obesity hypoventilation syndrome (HCC) 12/08/2013   Nocturnal seizures (HCC) 12/08/2013   OSA (obstructive sleep apnea) 12/08/2013   Noncompliance with CPAP treatment 12/08/2013   Psoriasis 11/29/2013   Seizure disorder, complex partial (HCC) 06/01/2012   Migraine 06/01/2012   OSA on CPAP 01/04/2012   Hiatal hernia 03/26/2011   Internal hemorrhoid 03/24/2011   Reflux esophagitis 03/24/2011   Psoriatic arthritis (HCC) 01/22/2011   Hernia of abdominal wall 01/22/2011    Past Medical History:  Diagnosis Date   Allergic rhinitis, cause unspecified    Anal fissure    Arthritis    inflammatory and psoriatic--Dr. Corliss Skains   Asthma    related to allergies and colds   Chronic bronchitis    gets bronchitis yearly with colds   Chronic kidney disease kidney stones   Fracture  stress fracture right foot   GERD (gastroesophageal reflux disease)    Hernia (acquired) (recurrent)    R lower abdomen; has seen [facility] (Dr. Ephriam Knuckles and recurred   Hiatal hernia 03/2011   Impaired fasting glucose    Internal hemorrhoids 03/2011   Menstrual migraine    Dr, Anne Hahn   Obesity     Reflux esophagitis 03/2011   Seizure disorder (HCC)    f/b Dr. Anne Hahn   Seizures [facility]) last seisure june 2012   Sleep apnea    inconclusive test per pt (some central and obstructive component)   Sleep apnea 06/07/09   mild complex sleep apnea, worse in supine position (uses CPAP intermittently only)   Tinea cruris 7/09    Family History  Problem Relation Age of Onset   Arthritis Mother        rheumatoid   Hyperlipidemia Mother    Hypertension Mother    Hyperthyroidism Mother        s/p thyroidectomy, now with hypothyroidism   Cancer Father 22       AML, lung cancer, kidney cancer (all presented at same time)   Diabetes Father    Hypertension Father    Hyperlipidemia Father    Psoriasis Father    Anxiety disorder Father        OCD, nervous breakdown   Psoriasis Sister    Irritable bowel syndrome Sister    Arthritis Sister        psoriatic   Diabetes Sister    Stroke Maternal Aunt    Diabetes Paternal Uncle    Crohn's disease Paternal Uncle    Cancer Paternal Uncle        ? type   Colitis Paternal Uncle    Asthma Maternal Grandmother    Hyperlipidemia Maternal Grandmother    Heart disease Maternal Grandmother    Stroke Maternal Grandmother    Diabetes Maternal Grandfather    Heart disease Maternal Grandfather    Cancer Paternal Grandmother 79       female (?ovarian)   Diabetes Paternal Grandmother    Hypertension Paternal Grandmother    Diabetes Paternal Grandfather    Heart disease Paternal Grandfather    Hypertension Paternal Grandfather    GER disease Daughter    Asthma Son    Stroke Cousin    Turner syndrome Niece    Esophageal cancer Neg Hx    Stomach cancer Neg Hx    Past Surgical History:  Procedure Laterality Date   BARIATRIC SURGERY  09/22/2017   CESAREAN SECTION     x2   CHOLECYSTECTOMY  2005   COLONOSCOPY  03/19/11   Dr. Juanda Chance; internal hemorrhoids   ESOPHAGOGASTRODUODENOSCOPY  03/19/11   hiatal hernia, esophagitis   KIDNEY STONE SURGERY  08/2010    retrieved with basket (at [facility])   repair of incisional hernia  2005, 2008   related to laparoscopic cholecystectomy   Social History   Social History Narrative   Patient Lives with husband Elijah Birk)  and 2 children (son and daughter, 35, 9)   Patient is right handed.   Patient drinks 2 cups daily- coffee/soda            Immunization History  Administered Date(s) Administered   Influenza Split 12/29/2010   Influenza,inj,Quad PF,6+ Mos 11/29/2013   Tdap 11/29/2013     Objective: Vital Signs: BP 130/83 (BP Location: Right Wrist, Patient Position: Sitting, Cuff Size: Normal)   Pulse 69   Resp 16   Ht 5\' 2"  (  1.575 m)   Wt 274 lb 6.4 oz (124.5 kg)   LMP 06/12/2014   BMI 50.19 kg/m    Physical Exam Vitals and nursing note reviewed.  Constitutional:      Appearance: She is well-developed.  HENT:     Head: Normocephalic and atraumatic.  Eyes:     Conjunctiva/sclera: Conjunctivae normal.  Cardiovascular:     Rate and Rhythm: Normal rate and regular rhythm.     Heart sounds: Normal heart sounds.  Pulmonary:     Effort: Pulmonary effort is normal.     Breath sounds: Normal breath sounds.  Abdominal:     General: Bowel sounds are normal.     Palpations: Abdomen is soft.  Musculoskeletal:     Cervical back: Normal range of motion.  Lymphadenopathy:     Cervical: No cervical adenopathy.  Skin:    General: Skin is warm and dry.     Capillary Refill: Capillary refill takes less than 2 seconds.  Neurological:     Mental Status: She is alert and oriented to person, place, and time.  Psychiatric:        Behavior: Behavior normal.      Musculoskeletal Exam: C-spine has good ROM.  Postural thoracic kyphosis.  No SI joint tenderness.  Shoulder joints have slightly limited internal rotation.  Elbow joints have good ROM.  Wrist joints have limited ROM. Tenderness along the ulnar aspect of the right wrist.  Tenderness of the right 2nd MCP.  Hip joints have good ROM with no  groin pain.  Discomfort with ROM of the right knee.  Left knee has good ROM with no warmth or effusion. Ankle joints have good ROM. Pedal edema noted bilaterally.  No tenderness or synovitis of MTP joints.  No dactylitis noted. No evidence of achilles tendonitis or plantar fasciitis.   CDAI Exam: CDAI Score: -- Patient Global: --; Provider Global: -- Swollen: 0 ; Tender: 2  Joint Exam 11/12/2022      Right  Left  Wrist   Tender     MCP 2   Tender        Investigation: No additional findings.  Imaging: No results found.  Recent Labs: Lab Results  Component Value Date   WBC 7.4 07/20/2022   HGB 12.9 07/20/2022   PLT 402 (H) 07/20/2022   NA 140 07/20/2022   K 4.4 07/20/2022   CL 102 07/20/2022   CO2 29 07/20/2022   GLUCOSE 87 07/20/2022   BUN 26 (H) 07/20/2022   CREATININE 0.67 07/20/2022   BILITOT 0.3 07/20/2022   ALKPHOS 75 10/16/2016   AST 18 07/20/2022   ALT 17 07/20/2022   PROT 7.4 07/20/2022   ALBUMIN 4.2 10/16/2016   CALCIUM 9.5 07/20/2022   GFRAA 114 09/18/2020   QFTBGOLDPLUS NEGATIVE 07/20/2022    Speciality Comments: TB Gold: 10/16/16 Neg Failed Cosentyx-developed rash that sent her to the ER Does not want to try TNF-inhibitor due to neurologic risk since she has epilepsy  Procedures:  No procedures performed Allergies: Cosentyx [secukinumab], Hydrocodone, Vimpat [lacosamide], Amoxicillin, Codeine, Keppra [levetiracetam], Sulfa antibiotics, Transderm-scop [scopolamine], and Tape   Assessment / Plan:     Visit Diagnoses: Psoriatic arthritis (HCC): No synovitis or dactylitis noted on examination today.  No SI joint tenderness upon palpation.  No evidence of Achilles tendinitis or plantar fasciitis.  Overall her symptoms have been stable on combination therapy.  She remains on Taltz 80 mg sq injections every 28 days along with Otrexup 20 mg sq injections  once weekly.  Patient was recently about 1 week overdue for her injections due to a recent gastrointestinal  infection.  She noticed some increased stiffness especially in her hands and feet which has since started to improve.  She will remain on Toltz and Otrexup as combination therapy.  She was vies notify us if she develops signs or symptoms of a flare or starts to have recurrent infections.  She will follow-up in the office in 3 months or sooner if needed.  Psoriasis:She will remain on Taltz and otrexup as combination therapy.    High risk medication use - Taltz-initiated on 02/10/2022, Otrexup 20 mg sq injections once weekly, and folic acid 2 mg daily.  Inadequate response to Simponi.  D/c Cosentyx due to rash. CBC and CMP updated on 07/20/22. Orders for CBC and CMP released today. Her next lab work will be due at the end of November and every 3 months.  TB gold negative on 07/20/22.  Discussed the importance of holding taltz and otrexup if she develops signs or symptoms of an infection and to resume once the infection has completely cleared.  - Plan: CBC with Differential/Platelet, COMPLETE METABOLIC PANEL WITH GFR  HLA B27 positive  Piriformis syndrome, left: Not currently symptomatic.    Pain in both hands - XR of both hands were updated on 11/26/2021 which were consistent w/ severe erosive psoriatic arthritis w/ radiographic progression compared to x-rays from 2021. Incomplete fist formation.  Edema noted bilaterally.  Tenderness over the right wrist and right 2nd MCP joint.    Chronic pain of right knee - X-rays of the right knee were consistent with moderate chondromalacia patella with patellar erosion on 11/26/2021.  Chronic pain.   Primary osteoarthritis of both feet: Pedal edema noted bilaterally.  No tenderness or synovitis of MTP joints.  No dactylitis noted.   Other medical conditions are listed as follows:   History of sleep apnea  Skin yeast infection  History of asthma  History of epilepsy  History of hernia repair  History of cholecystectomy  History of gastric  bypass  Family history of rheumatoid arthritis  Orders: Orders Placed This Encounter  Procedures   CBC with Differential/Platelet   COMPLETE METABOLIC PANEL WITH GFR   No orders of the defined types were placed in this encounter.    Follow-Up Instructions: Return in about 3 months (around 02/12/2023) for Psoriatic arthritis, Osteoarthritis.   Gearldine Bienenstock, PA-C  Note - This record has been created using Dragon software.  Chart creation errors have been sought, but may not always  have been located. Such creation errors do not reflect on  the standard of medical care.

## 2022-11-02 DIAGNOSIS — R638 Other symptoms and signs concerning food and fluid intake: Secondary | ICD-10-CM | POA: Diagnosis not present

## 2022-11-02 DIAGNOSIS — R7982 Elevated C-reactive protein (CRP): Secondary | ICD-10-CM | POA: Diagnosis not present

## 2022-11-02 DIAGNOSIS — E669 Obesity, unspecified: Secondary | ICD-10-CM | POA: Diagnosis not present

## 2022-11-02 DIAGNOSIS — E8889 Other specified metabolic disorders: Secondary | ICD-10-CM | POA: Diagnosis not present

## 2022-11-09 ENCOUNTER — Other Ambulatory Visit: Payer: Self-pay | Admitting: Physician Assistant

## 2022-11-09 DIAGNOSIS — L409 Psoriasis, unspecified: Secondary | ICD-10-CM

## 2022-11-09 DIAGNOSIS — L4050 Arthropathic psoriasis, unspecified: Secondary | ICD-10-CM

## 2022-11-09 DIAGNOSIS — Z79899 Other long term (current) drug therapy: Secondary | ICD-10-CM

## 2022-11-09 NOTE — Telephone Encounter (Signed)
I called patient, patient will have labs drawn at upcoming appt 11/12/2022

## 2022-11-09 NOTE — Telephone Encounter (Signed)
Due to update lab work

## 2022-11-09 NOTE — Telephone Encounter (Signed)
Last Fill: 08/14/2022  Labs: 07/20/2022  CBC and CMP are stable.   Next Visit: 11/12/2022  Last Visit: 08/12/2022  DX:  Psoriatic arthritis   Current Dose per office note 08/12/2022: Otrexup 20 mg sq injections once weekly   Okay to refill Otrexup?

## 2022-11-10 NOTE — Telephone Encounter (Signed)
Last Fill: 07/21/2022  Labs: 07/20/2022  CBC and CMP are stable.   TB Gold: 07/20/2022   TB gold negative   Next Visit: 11/12/2022  Last Visit: 08/12/2022  TD:[id number] arthritis   Current Dose per office note 08/12/2022: Taltz 80 mg subcu days injections   Okay to refill Taltz?

## 2022-11-12 ENCOUNTER — Encounter: Payer: Self-pay | Admitting: Physician Assistant

## 2022-11-12 ENCOUNTER — Ambulatory Visit: Payer: BC Managed Care – PPO | Attending: Physician Assistant | Admitting: Physician Assistant

## 2022-11-12 VITALS — BP 130/83 | HR 69 | Resp 16 | Ht 62.0 in | Wt 274.4 lb

## 2022-11-12 DIAGNOSIS — Z8719 Personal history of other diseases of the digestive system: Secondary | ICD-10-CM

## 2022-11-12 DIAGNOSIS — Z1589 Genetic susceptibility to other disease: Secondary | ICD-10-CM | POA: Diagnosis not present

## 2022-11-12 DIAGNOSIS — L409 Psoriasis, unspecified: Secondary | ICD-10-CM | POA: Diagnosis not present

## 2022-11-12 DIAGNOSIS — Z8261 Family history of arthritis: Secondary | ICD-10-CM

## 2022-11-12 DIAGNOSIS — Z8669 Personal history of other diseases of the nervous system and sense organs: Secondary | ICD-10-CM

## 2022-11-12 DIAGNOSIS — M79642 Pain in left hand: Secondary | ICD-10-CM

## 2022-11-12 DIAGNOSIS — B372 Candidiasis of skin and nail: Secondary | ICD-10-CM

## 2022-11-12 DIAGNOSIS — M19071 Primary osteoarthritis, right ankle and foot: Secondary | ICD-10-CM

## 2022-11-12 DIAGNOSIS — G8929 Other chronic pain: Secondary | ICD-10-CM

## 2022-11-12 DIAGNOSIS — M25561 Pain in right knee: Secondary | ICD-10-CM

## 2022-11-12 DIAGNOSIS — Z79899 Other long term (current) drug therapy: Secondary | ICD-10-CM | POA: Diagnosis not present

## 2022-11-12 DIAGNOSIS — L4050 Arthropathic psoriasis, unspecified: Secondary | ICD-10-CM | POA: Diagnosis not present

## 2022-11-12 DIAGNOSIS — M79641 Pain in right hand: Secondary | ICD-10-CM

## 2022-11-12 DIAGNOSIS — M19072 Primary osteoarthritis, left ankle and foot: Secondary | ICD-10-CM

## 2022-11-12 DIAGNOSIS — G5702 Lesion of sciatic nerve, left lower limb: Secondary | ICD-10-CM

## 2022-11-12 DIAGNOSIS — Z9889 Other specified postprocedural states: Secondary | ICD-10-CM

## 2022-11-12 DIAGNOSIS — Z8709 Personal history of other diseases of the respiratory system: Secondary | ICD-10-CM

## 2022-11-12 DIAGNOSIS — Z9884 Bariatric surgery status: Secondary | ICD-10-CM

## 2022-11-12 DIAGNOSIS — Z9049 Acquired absence of other specified parts of digestive tract: Secondary | ICD-10-CM

## 2022-11-12 NOTE — Patient Instructions (Signed)

## 2022-11-13 DIAGNOSIS — H0102B Squamous blepharitis left eye, upper and lower eyelids: Secondary | ICD-10-CM | POA: Diagnosis not present

## 2022-11-13 DIAGNOSIS — H0102A Squamous blepharitis right eye, upper and lower eyelids: Secondary | ICD-10-CM | POA: Diagnosis not present

## 2022-11-13 LAB — CBC WITH DIFFERENTIAL/PLATELET
Absolute Monocytes: 384 {cells}/uL (ref 200–950)
Basophils Absolute: 63 {cells}/uL (ref 0–200)
Basophils Relative: 1 %
Eosinophils Absolute: 50 {cells}/uL (ref 15–500)
Eosinophils Relative: 0.8 %
HCT: 41.9 % (ref 35.0–45.0)
Hemoglobin: 12.8 g/dL (ref 11.7–15.5)
Lymphs Abs: 2022 cells/uL (ref 850–3900)
MCH: 25.8 pg — ABNORMAL LOW (ref 27.0–33.0)
MCHC: 30.5 g/dL — ABNORMAL LOW (ref 32.0–36.0)
MCV: 84.3 fL (ref 80.0–100.0)
MPV: 9.2 fL (ref 7.5–12.5)
Monocytes Relative: 6.1 %
Neutro Abs: 3780 cells/uL (ref 1500–7800)
Neutrophils Relative %: 60 %
Platelets: 356 10*3/uL (ref 140–400)
RBC: 4.97 10*6/uL (ref 3.80–5.10)
RDW: 15.8 % — ABNORMAL HIGH (ref 11.0–15.0)
Total Lymphocyte: 32.1 %
WBC: 6.3 10*3/uL (ref 3.8–10.8)

## 2022-11-13 LAB — COMPLETE METABOLIC PANEL WITH GFR
AG Ratio: 1.3 (calc) (ref 1.0–2.5)
ALT: 23 U/L (ref 6–29)
AST: 22 U/L (ref 10–35)
Albumin: 4 g/dL (ref 3.6–5.1)
Alkaline phosphatase (APISO): 84 U/L (ref 37–153)
BUN: 23 mg/dL (ref 7–25)
CO2: 29 mmol/L (ref 20–32)
Calcium: 9.1 mg/dL (ref 8.6–10.4)
Chloride: 103 mmol/L (ref 98–110)
Creat: 0.71 mg/dL (ref 0.50–1.03)
Globulin: 3.1 g/dL (ref 1.9–3.7)
Glucose, Bld: 91 mg/dL (ref 65–99)
Potassium: 4.2 mmol/L (ref 3.5–5.3)
Sodium: 141 mmol/L (ref 135–146)
Total Bilirubin: 0.4 mg/dL (ref 0.2–1.2)
Total Protein: 7.1 g/dL (ref 6.1–8.1)
eGFR: 100 mL/min/{1.73_m2} (ref >=60)

## 2022-11-13 NOTE — Progress Notes (Signed)
CBC stable. CMP WNL.

## 2022-11-23 DIAGNOSIS — Z6841 Body Mass Index (BMI) 40.0 and over, adult: Secondary | ICD-10-CM | POA: Diagnosis not present

## 2022-11-23 DIAGNOSIS — E559 Vitamin D deficiency, unspecified: Secondary | ICD-10-CM | POA: Diagnosis not present

## 2022-11-23 DIAGNOSIS — E669 Obesity, unspecified: Secondary | ICD-10-CM | POA: Diagnosis not present

## 2022-11-23 DIAGNOSIS — R5383 Other fatigue: Secondary | ICD-10-CM | POA: Diagnosis not present

## 2022-12-01 DIAGNOSIS — R110 Nausea: Secondary | ICD-10-CM | POA: Diagnosis not present

## 2022-12-01 DIAGNOSIS — I878 Other specified disorders of veins: Secondary | ICD-10-CM | POA: Diagnosis not present

## 2022-12-01 DIAGNOSIS — K449 Diaphragmatic hernia without obstruction or gangrene: Secondary | ICD-10-CM | POA: Diagnosis not present

## 2022-12-01 DIAGNOSIS — K439 Ventral hernia without obstruction or gangrene: Secondary | ICD-10-CM | POA: Diagnosis not present

## 2022-12-01 DIAGNOSIS — R109 Unspecified abdominal pain: Secondary | ICD-10-CM | POA: Diagnosis not present

## 2022-12-01 DIAGNOSIS — D259 Leiomyoma of uterus, unspecified: Secondary | ICD-10-CM | POA: Diagnosis not present

## 2022-12-01 DIAGNOSIS — I808 Phlebitis and thrombophlebitis of other sites: Secondary | ICD-10-CM | POA: Diagnosis not present

## 2022-12-01 DIAGNOSIS — K429 Umbilical hernia without obstruction or gangrene: Secondary | ICD-10-CM | POA: Diagnosis not present

## 2022-12-01 DIAGNOSIS — K469 Unspecified abdominal hernia without obstruction or gangrene: Secondary | ICD-10-CM | POA: Diagnosis not present

## 2022-12-01 DIAGNOSIS — Z9884 Bariatric surgery status: Secondary | ICD-10-CM | POA: Diagnosis not present

## 2022-12-01 DIAGNOSIS — M545 Low back pain, unspecified: Secondary | ICD-10-CM | POA: Diagnosis not present

## 2022-12-01 DIAGNOSIS — K5900 Constipation, unspecified: Secondary | ICD-10-CM | POA: Diagnosis not present

## 2022-12-04 DIAGNOSIS — R109 Unspecified abdominal pain: Secondary | ICD-10-CM | POA: Diagnosis not present

## 2022-12-04 DIAGNOSIS — R8290 Unspecified abnormal findings in urine: Secondary | ICD-10-CM | POA: Diagnosis not present

## 2022-12-07 DIAGNOSIS — B029 Zoster without complications: Secondary | ICD-10-CM | POA: Diagnosis not present

## 2022-12-07 DIAGNOSIS — K5900 Constipation, unspecified: Secondary | ICD-10-CM | POA: Diagnosis not present

## 2022-12-07 DIAGNOSIS — E669 Obesity, unspecified: Secondary | ICD-10-CM | POA: Diagnosis not present

## 2022-12-28 DIAGNOSIS — E559 Vitamin D deficiency, unspecified: Secondary | ICD-10-CM | POA: Diagnosis not present

## 2022-12-28 DIAGNOSIS — R5383 Other fatigue: Secondary | ICD-10-CM | POA: Diagnosis not present

## 2022-12-28 DIAGNOSIS — Z6841 Body Mass Index (BMI) 40.0 and over, adult: Secondary | ICD-10-CM | POA: Diagnosis not present

## 2022-12-28 DIAGNOSIS — E669 Obesity, unspecified: Secondary | ICD-10-CM | POA: Diagnosis not present

## 2023-01-07 DIAGNOSIS — Z1231 Encounter for screening mammogram for malignant neoplasm of breast: Secondary | ICD-10-CM | POA: Diagnosis not present

## 2023-01-07 DIAGNOSIS — Z1211 Encounter for screening for malignant neoplasm of colon: Secondary | ICD-10-CM | POA: Diagnosis not present

## 2023-01-07 DIAGNOSIS — Z0000 Encounter for general adult medical examination without abnormal findings: Secondary | ICD-10-CM | POA: Diagnosis not present

## 2023-01-18 DIAGNOSIS — E6609 Other obesity due to excess calories: Secondary | ICD-10-CM | POA: Diagnosis not present

## 2023-01-18 DIAGNOSIS — R7982 Elevated C-reactive protein (CRP): Secondary | ICD-10-CM | POA: Diagnosis not present

## 2023-01-18 DIAGNOSIS — R7303 Prediabetes: Secondary | ICD-10-CM | POA: Diagnosis not present

## 2023-01-18 DIAGNOSIS — E559 Vitamin D deficiency, unspecified: Secondary | ICD-10-CM | POA: Diagnosis not present

## 2023-02-01 ENCOUNTER — Other Ambulatory Visit: Payer: Self-pay | Admitting: Physician Assistant

## 2023-02-01 NOTE — Progress Notes (Unsigned)
Office Visit Note  Patient: Tiffany Wilkins             Date of Birth: 1965-03-19           MRN: [id number]             PCP: Laurena Bering, DO Referring: Laurena Bering, DO Visit Date: 02/15/2023 Occupation: @GUAROCC @  Subjective:  Medication monitoring   History of Present Illness: Tiffany Wilkins is a 57 y.o. female with history of psoriatic arthritis.  Patient remains on Taltz-initiated on 02/10/2022, Otrexup 20 mg sq injections once weekly, and folic acid 2 mg daily.  She is tolerating combination therapy without any side effects.  She has not had any recent gaps in therapy.  Patient is currently having increased pain and inflammation in both wrists, both hands, and her right knee joint.  She attributes her current flare to being on her feet more than usual and having to cook for Thanksgiving.  She has requested a prednisone taper to be sent to the pharmacy today.  she denies any Achilles tendinitis or plantar fasciitis.  She denies any SI joint pain currently.  Overall her psoriasis has been well controlled with the use of taltz and clobetasol PRN.  She requested a refill of clobetasol to the pharmacy today.   Patient remains on naltrexone for weight loss.  She is also been having wellness injections once weekly which she feels has helped with her fatigue.  She has been sleeping well at night.    Activities of Daily Living:  Patient reports morning stiffness for 30 minutes.   Patient Reports nocturnal pain.  Difficulty dressing/grooming: Reports Difficulty climbing stairs: Reports Difficulty getting out of chair: Reports Difficulty using hands for taps, buttons, cutlery, and/or writing: Reports  Review of Systems  Constitutional:  Negative for fatigue.  HENT:  Negative for mouth sores and mouth dryness.   Eyes:  Negative for pain, visual disturbance and dryness.  Respiratory:  Negative for shortness of breath.   Cardiovascular:  Negative for chest pain and palpitations.   Gastrointestinal:  Positive for constipation and diarrhea. Negative for blood in stool.  Endocrine: Negative for increased urination.  Genitourinary:  Negative for involuntary urination.  Musculoskeletal:  Positive for joint pain, gait problem, joint pain, joint swelling and morning stiffness. Negative for myalgias, muscle weakness, muscle tenderness and myalgias.  Skin:  Negative for color change, rash, hair loss and sensitivity to sunlight.  Allergic/Immunologic: Positive for susceptible to infections.  Neurological:  Negative for dizziness and headaches.  Hematological:  Negative for swollen glands.  Psychiatric/Behavioral:  Negative for depressed mood and sleep disturbance. The patient is not nervous/anxious.     PMFS History:  Patient Active Problem List   Diagnosis Date Noted   Primary osteoarthritis of both knees 12/11/2016   High risk medication use 06/30/2016   Right hand pain 06/30/2016   Right hip pain 06/30/2016   Hepatic steatosis 06/27/2014   BMI 50.0-59.9, adult (HCC) 06/27/2014   Fibroid, uterine 06/27/2014   Abdominal pain, chronic, right lower quadrant 06/27/2014   Obesity hypoventilation syndrome (HCC) 12/08/2013   Nocturnal seizures (HCC) 12/08/2013   OSA (obstructive sleep apnea) 12/08/2013   Noncompliance with CPAP treatment 12/08/2013   Psoriasis 11/29/2013   Seizure disorder, complex partial (HCC) 06/01/2012   Migraine 06/01/2012   OSA on CPAP 01/04/2012   Hiatal hernia 03/26/2011   Internal hemorrhoid 03/24/2011   Reflux esophagitis 03/24/2011   Psoriatic arthritis (HCC) 01/22/2011   Hernia of  abdominal wall 01/22/2011    Past Medical History:  Diagnosis Date   Allergic rhinitis, cause unspecified    Anal fissure    Arthritis    inflammatory and psoriatic--Dr. Corliss Skains   Asthma    related to allergies and colds   Chronic bronchitis    gets bronchitis yearly with colds   Chronic kidney disease kidney stones   Fracture    stress fracture right  foot   GERD (gastroesophageal reflux disease)    Hernia (acquired) (recurrent)    R lower abdomen; has seen [facility] (Dr. Ephriam Knuckles and recurred   Hiatal hernia 03/2011   Impaired fasting glucose    Internal hemorrhoids 03/2011   Menstrual migraine    Dr, Anne Hahn   Obesity    Reflux esophagitis 03/2011   Seizure disorder (HCC)    f/b Dr. Anne Hahn   Seizures [facility]) last seisure june 2012   Sleep apnea    inconclusive test per pt (some central and obstructive component)   Sleep apnea 06/07/09   mild complex sleep apnea, worse in supine position (uses CPAP intermittently only)   Tinea cruris 7/09    Family History  Problem Relation Age of Onset   Arthritis Mother        rheumatoid   Hyperlipidemia Mother    Hypertension Mother    Hyperthyroidism Mother        s/p thyroidectomy, now with hypothyroidism   Cancer Father 1       AML, lung cancer, kidney cancer (all presented at same time)   Diabetes Father    Hypertension Father    Hyperlipidemia Father    Psoriasis Father    Anxiety disorder Father        OCD, nervous breakdown   Psoriasis Sister    Irritable bowel syndrome Sister    Arthritis Sister        psoriatic   Diabetes Sister    Stroke Maternal Aunt    Diabetes Paternal Uncle    Crohn's disease Paternal Uncle    Cancer Paternal Uncle        ? type   Colitis Paternal Uncle    Asthma Maternal Grandmother    Hyperlipidemia Maternal Grandmother    Heart disease Maternal Grandmother    Stroke Maternal Grandmother    Diabetes Maternal Grandfather    Heart disease Maternal Grandfather    Cancer Paternal Grandmother 24       female (?ovarian)   Diabetes Paternal Grandmother    Hypertension Paternal Grandmother    Diabetes Paternal Grandfather    Heart disease Paternal Grandfather    Hypertension Paternal Grandfather    GER disease Daughter    Asthma Son    Stroke Cousin    Turner syndrome Niece    Esophageal cancer Neg Hx    Stomach cancer Neg Hx    Past  Surgical History:  Procedure Laterality Date   BARIATRIC SURGERY  09/22/2017   CESAREAN SECTION     x2   CHOLECYSTECTOMY  2005   COLONOSCOPY  03/19/11   Dr. Juanda Chance; internal hemorrhoids   ESOPHAGOGASTRODUODENOSCOPY  03/19/11   hiatal hernia, esophagitis   KIDNEY STONE SURGERY  08/2010   retrieved with basket (at [facility])   repair of incisional hernia  2005, 2008   related to laparoscopic cholecystectomy   Social History   Social History Narrative   Patient Lives with husband Elijah Birk)  and 2 children (son and daughter, 26, 39)   Patient is right handed.  Patient drinks 2 cups daily- coffee/soda            Immunization History  Administered Date(s) Administered   Influenza Split 12/29/2010   Influenza,inj,Quad PF,6+ Mos 11/29/2013   Tdap 11/29/2013     Objective: Vital Signs: BP 126/86 (BP Location: Left Arm, Patient Position: Sitting, Cuff Size: Large)   Pulse 67   Resp 17   Ht 5\' 2"  (1.575 m)   Wt 277 lb 9.6 oz (125.9 kg)   LMP 06/12/2014   BMI 50.77 kg/m    Physical Exam Vitals and nursing note reviewed.  Constitutional:      Appearance: She is well-developed.  HENT:     Head: Normocephalic and atraumatic.  Eyes:     Conjunctiva/sclera: Conjunctivae normal.  Cardiovascular:     Rate and Rhythm: Normal rate and regular rhythm.     Heart sounds: Normal heart sounds.  Pulmonary:     Effort: Pulmonary effort is normal.     Breath sounds: Normal breath sounds.  Abdominal:     General: Bowel sounds are normal.     Palpations: Abdomen is soft.  Musculoskeletal:     Cervical back: Normal range of motion.  Lymphadenopathy:     Cervical: No cervical adenopathy.  Skin:    General: Skin is warm and dry.     Capillary Refill: Capillary refill takes less than 2 seconds.  Neurological:     Mental Status: She is alert and oriented to person, place, and time.  Psychiatric:        Behavior: Behavior normal.      Musculoskeletal Exam: C-spine has good ROM.   Thoracic kyphosis.  Shoulder joints have limited internal rotation.  Elbow joints have good range of motion.  Wrist joints have limited range of motion with tenderness to palpation bilaterally.  Tenderness of the right second, third, and fourth PIP joints.  Tenderness of the DIP of the left 2nd digit.  Hip joints have good range of motion.  Discomfort with range of motion of the right knee.  Warmth of the right knee noted.  No effusion noted.  Ankle joints have good range of motion.  Pedal edema noted bilaterally.  No evidence of achilles tendinitis.  CDAI Exam: CDAI Score: -- Patient Global: --; Provider Global: -- Swollen: 1 ; Tender: 7  Joint Exam 02/15/2023      Right  Left  Wrist   Tender   Tender  PIP 2 (finger)   Tender     PIP 3 (finger)   Tender     PIP 4 (finger)  Swollen Tender     DIP 2 (finger)      Tender  Knee   Tender        Investigation: No additional findings.  Imaging: No results found.  Recent Labs: Lab Results  Component Value Date   WBC 6.3 11/12/2022   HGB 12.8 11/12/2022   PLT 356 11/12/2022   NA 141 11/12/2022   K 4.2 11/12/2022   CL 103 11/12/2022   CO2 29 11/12/2022   GLUCOSE 91 11/12/2022   BUN 23 11/12/2022   CREATININE 0.71 11/12/2022   BILITOT 0.4 11/12/2022   ALKPHOS 75 10/16/2016   AST 22 11/12/2022   ALT 23 11/12/2022   PROT 7.1 11/12/2022   ALBUMIN 4.2 10/16/2016   CALCIUM 9.1 11/12/2022   GFRAA 114 09/18/2020   QFTBGOLDPLUS NEGATIVE 07/20/2022    Speciality Comments: TB Gold: 10/16/16 Neg Failed Cosentyx-developed rash that sent her to the ER  Does not want to try TNF-inhibitor due to neurologic risk since she has epilepsy  Procedures:  No procedures performed Allergies: Cosentyx [secukinumab], Hydrocodone, Vimpat [lacosamide], Amoxicillin, Codeine, Keppra [levetiracetam], Sulfa antibiotics, Transderm-scop [scopolamine], and Tape    Assessment / Plan:     Visit Diagnoses: Psoriatic arthritis [facility]): Patient presents today  experiencing increased pain in both wrists, both hands, and her right knee joint.  She has tenderness to palpation over bilateral wrist joints and several PIP and DIP joints.  Warmth of the right knee was noted on exam.  Inflammation was noted in the right fourth PIP joint.  She remains on Taltz 80 mg sq injections every 28 days and Otrexup 20 mg sq injections once weekly.  She has not had any recent gaps in therapy.  She attributes her current flare to her activity level around Thanksgiving as well as weather change.  Patient requested a prednisone taper which will be sent to the pharmacy today.  She will take prednisone 20 mg tapering by 5 mg every 4 days.  She will take prednisone in the morning and avoid the use of NSAIDs.  She will notify us if her symptoms persist or worsen.  She will remain on Taltz, Otrexup, and folic acid as prescribed.  She will follow-up in the office in 3 months or sooner if needed.  Psoriasis -Umbilical and gluteal region.  Well-controlled on Taltz and Otrexup as combination therapy.  She uses clobetasol cream topically as needed so refill sent to the pharmacy today.  Plan: clobetasol cream (TEMOVATE) 0.05 %  High risk medication use - Taltz-initiated on 02/10/2022, Otrexup 20 mg sq injections once weekly, and folic acid 2 mg daily.  Inadequate response to Simponi.  D/c Cosentyx due to rash. CBC and CMP updated on 01/07/23.  Her next lab work will be due January and every 3 months.  Standing orders for CBC and CMP remain in place.   TB gold negative on 07/20/22.   No recent or recurrent infections. Discussed the importance of holding taltz and otrexup if she develops signs or symptoms of an infection and to resume once the infection has completely cleared.    HLA B27 positive  Piriformis syndrome, left: Not currently symptomatic.   Pain in both hands - XR of both hands were updated on 11/26/2021 which were consistent w/ severe erosive psoriatic arthritis w/ radiographic  progression compared to x-rays from 2021.  Patient presents today with increased pain involving both wrists and both hands.  Inflammation noted in the right fourth PIP joint. A prednisone taper was sent to the pharmacy as requested.   Chronic pain of right knee - X-rays of the right knee were consistent with moderate chondromalacia patella with patellar erosion on 11/26/2021.  Chronic pain.  Patient presents today with increased discomfort in the right knee.  Warmth but no effusion noted on examination today.  She has requested a prednisone taper sent to the pharmacy.  She will notify us if her symptoms persist or worsen.   Primary osteoarthritis of both feet: She is not experiencing any increased discomfort in her feet.  No evidence of Achilles tendinitis or plantar fasciitis.  Pedal edema noted.  Other medical conditions are listed as follows:   History of sleep apnea  Skin yeast infection  History of asthma  History of epilepsy  History of hernia repair  History of cholecystectomy  History of gastric bypass  Family history of rheumatoid arthritis  Generalized edema  Orders: No orders of the  defined types were placed in this encounter.  Meds ordered this encounter  Medications   clobetasol cream (TEMOVATE) 0.05 %    Sig: Apply 1 Application topically 2 (two) times daily.    Dispense:  45 g    Refill:  0   predniSONE (DELTASONE) 5 MG tablet    Sig: Take 4 tabs po x 4 days, 3  tabs po x 4 days, 2  tabs po x 4 days, 1  tab po x 4 days    Dispense:  40 tablet    Refill:  0    Follow-Up Instructions: Return in about 3 months (around 05/16/2023) for Psoriatic arthritis.   Gearldine Bienenstock, PA-C  Note - This record has been created using Dragon software.  Chart creation errors have been sought, but may not always  have been located. Such creation errors do not reflect on  the standard of medical care.

## 2023-02-01 NOTE — Telephone Encounter (Signed)
Last Fill: 11/09/2022  Labs: 01/07/2023 MCH 27.3, RDW 17.8, CMP WNL  Next Visit: 02/15/2023  Last Visit: 11/12/2022  DX: Psoriatic arthritis   Current Dose per office note 11/12/2022: Otrexup 20 mg sq injections once weekly   Okay to refill Otrexup?

## 2023-02-03 DIAGNOSIS — Z1231 Encounter for screening mammogram for malignant neoplasm of breast: Secondary | ICD-10-CM | POA: Diagnosis not present

## 2023-02-08 ENCOUNTER — Telehealth: Payer: Self-pay | Admitting: Pharmacist

## 2023-02-08 NOTE — Telephone Encounter (Signed)
Clinical questions for urgent Taltz PA completed  Chesley Mires, PharmD, MPH, BCPS, CPP Clinical Pharmacist (Rheumatology and Pulmonology)

## 2023-02-08 NOTE — Telephone Encounter (Signed)
Received a fax regarding Prior Authorization from [facility] for TALTZ. Authorization has been DENIED because:  .  Faxed to [employer] at [phone]  Will work on appeal  Chesley Mires, PharmD, MPH, BCPS, CPP Clinical Pharmacist (Rheumatology and Pulmonology)

## 2023-02-08 NOTE — Telephone Encounter (Signed)
Lucretia Field from [employer] contacted the office in regards to the patient's PA for her [employer]iler. Advised Ilana that the PA is ongoing. Lucretia Field states she would like to be faxed the outcome of the PA when it arrives. Lucretia Field states the fax number is [phone]. The call back number is [phone].

## 2023-02-08 NOTE — Telephone Encounter (Signed)
Received fax from [employer] requesting update on [employer]. Patient is enrolled into Taltz Together bridge program and they require ongoing coverage attempts through insurance  Submitted a Prior Authorization request to [facility] for TALTZ via CoverMyMeds. Pending clinical questions to populate  Key: [id number]

## 2023-02-15 ENCOUNTER — Ambulatory Visit: Payer: BC Managed Care – PPO | Attending: Physician Assistant | Admitting: Physician Assistant

## 2023-02-15 ENCOUNTER — Other Ambulatory Visit: Payer: Self-pay | Admitting: Physician Assistant

## 2023-02-15 ENCOUNTER — Encounter: Payer: Self-pay | Admitting: Physician Assistant

## 2023-02-15 VITALS — BP 126/86 | HR 67 | Resp 17 | Ht 62.0 in | Wt 277.6 lb

## 2023-02-15 DIAGNOSIS — M19071 Primary osteoarthritis, right ankle and foot: Secondary | ICD-10-CM

## 2023-02-15 DIAGNOSIS — Z1589 Genetic susceptibility to other disease: Secondary | ICD-10-CM

## 2023-02-15 DIAGNOSIS — Z9889 Other specified postprocedural states: Secondary | ICD-10-CM

## 2023-02-15 DIAGNOSIS — L4050 Arthropathic psoriasis, unspecified: Secondary | ICD-10-CM

## 2023-02-15 DIAGNOSIS — G5702 Lesion of sciatic nerve, left lower limb: Secondary | ICD-10-CM

## 2023-02-15 DIAGNOSIS — Z9884 Bariatric surgery status: Secondary | ICD-10-CM

## 2023-02-15 DIAGNOSIS — Z8261 Family history of arthritis: Secondary | ICD-10-CM

## 2023-02-15 DIAGNOSIS — Z8719 Personal history of other diseases of the digestive system: Secondary | ICD-10-CM

## 2023-02-15 DIAGNOSIS — M25561 Pain in right knee: Secondary | ICD-10-CM

## 2023-02-15 DIAGNOSIS — Z79899 Other long term (current) drug therapy: Secondary | ICD-10-CM

## 2023-02-15 DIAGNOSIS — G8929 Other chronic pain: Secondary | ICD-10-CM

## 2023-02-15 DIAGNOSIS — L409 Psoriasis, unspecified: Secondary | ICD-10-CM | POA: Diagnosis not present

## 2023-02-15 DIAGNOSIS — M79641 Pain in right hand: Secondary | ICD-10-CM

## 2023-02-15 DIAGNOSIS — M79642 Pain in left hand: Secondary | ICD-10-CM

## 2023-02-15 DIAGNOSIS — Z8709 Personal history of other diseases of the respiratory system: Secondary | ICD-10-CM

## 2023-02-15 DIAGNOSIS — B372 Candidiasis of skin and nail: Secondary | ICD-10-CM

## 2023-02-15 DIAGNOSIS — Z8669 Personal history of other diseases of the nervous system and sense organs: Secondary | ICD-10-CM

## 2023-02-15 DIAGNOSIS — Z9049 Acquired absence of other specified parts of digestive tract: Secondary | ICD-10-CM

## 2023-02-15 DIAGNOSIS — M19072 Primary osteoarthritis, left ankle and foot: Secondary | ICD-10-CM

## 2023-02-15 DIAGNOSIS — R601 Generalized edema: Secondary | ICD-10-CM

## 2023-02-15 MED ORDER — PREDNISONE 5 MG PO TABS: ORAL_TABLET | ORAL | 0 refills | Status: DC

## 2023-02-15 MED ORDER — CLOBETASOL PROPIONATE 0.05 % EX CREA: 1.0000 | TOPICAL_CREAM | Freq: Two times a day (BID) | CUTANEOUS | 0 refills | Status: DC

## 2023-02-15 NOTE — Telephone Encounter (Signed)
Last Fill: 02/17/2022  Next Visit: 05/17/2023  Last Visit: 02/15/2023  Dx:  Psoriatic arthritis   Current Dose per office note on 02/15/2023: folic acid 2 mg daily   Okay to refill Folic Acid?

## 2023-02-15 NOTE — Patient Instructions (Signed)

## 2023-02-25 ENCOUNTER — Other Ambulatory Visit: Payer: Self-pay | Admitting: Physician Assistant

## 2023-02-25 DIAGNOSIS — L4050 Arthropathic psoriasis, unspecified: Secondary | ICD-10-CM

## 2023-02-25 DIAGNOSIS — L409 Psoriasis, unspecified: Secondary | ICD-10-CM

## 2023-02-25 DIAGNOSIS — Z79899 Other long term (current) drug therapy: Secondary | ICD-10-CM

## 2023-02-25 NOTE — Telephone Encounter (Signed)
Last Fill: 11/10/2022  Labs: 01/07/2023 MCH 27.3, RDW 17.8  TB Gold: 07/20/2022 Neg    Next Visit: 05/17/2023  Last Visit: 02/15/2023  DX: Psoriatic arthritis   Current Dose per office note 02/15/2023: Taltz 80 mg sq injections every 28 days   Okay to refill Taltz?

## 2023-03-01 DIAGNOSIS — E559 Vitamin D deficiency, unspecified: Secondary | ICD-10-CM | POA: Diagnosis not present

## 2023-03-01 DIAGNOSIS — M2550 Pain in unspecified joint: Secondary | ICD-10-CM | POA: Diagnosis not present

## 2023-03-01 DIAGNOSIS — R638 Other symptoms and signs concerning food and fluid intake: Secondary | ICD-10-CM | POA: Diagnosis not present

## 2023-03-01 DIAGNOSIS — R7303 Prediabetes: Secondary | ICD-10-CM | POA: Diagnosis not present

## 2023-03-01 DIAGNOSIS — E6609 Other obesity due to excess calories: Secondary | ICD-10-CM | POA: Diagnosis not present

## 2023-03-01 DIAGNOSIS — K5900 Constipation, unspecified: Secondary | ICD-10-CM | POA: Diagnosis not present

## 2023-03-01 DIAGNOSIS — R7982 Elevated C-reactive protein (CRP): Secondary | ICD-10-CM | POA: Diagnosis not present

## 2023-04-23 ENCOUNTER — Telehealth: Payer: Self-pay | Admitting: Physician Assistant

## 2023-04-23 NOTE — Telephone Encounter (Signed)
 Pt called confused about the bill she got that was $310. Pt states at the date of service she paid her copay that was $50. Pt is confused why she has to pay that bill when insurance should covered 80% of that. Pt states that appt was 11/12/22.

## 2023-05-03 NOTE — Progress Notes (Signed)
 Office Visit Note  Patient: Tiffany Wilkins             Date of Birth: Oct 05, 1965           MRN: [id number]             PCP: Laurena Bering, DO Referring: Laurena Bering, DO Visit Date: 05/17/2023 Occupation: @GUAROCC @  Subjective:  Medication monitoring   History of Present Illness: Tiffany Wilkins is a 58 y.o. female with history of psoriatic arthritis.  Patient remains on Taltz 80 mg sq injections every 28 days-initiated on 02/10/2022, Otrexup 20 mg sq injections once weekly, and folic acid 2 mg daily. She is tolerating combination therapy without any side effects.  Patient continues to have difficulty self administering otrexup due to difficulty with the device.  She states she has to hit the button with a remote for it to inject and states last week the medication dripped down her leg.  She continues to have ongoing pain and stiffness in both hands, both wrists, and the right knee.  She has been taking 2 aleve in the morning daily for symptomatic relief.  She denies any increased joint swelling. She has been working with a dietician on weight loss.  She denies any recent or recurrent infections.     Activities of Daily Living:  Patient reports morning stiffness for all day. Patient Reports nocturnal pain.  Difficulty dressing/grooming: Reports Difficulty climbing stairs: Denies Difficulty getting out of chair: Reports Difficulty using hands for taps, buttons, cutlery, and/or writing: Reports  Review of Systems  Constitutional:  Negative for fatigue.  HENT:  Negative for mouth sores, mouth dryness and nose dryness.        Nose sores   Eyes:  Positive for dryness. Negative for pain.  Respiratory:  Negative for shortness of breath and difficulty breathing.   Cardiovascular:  Negative for chest pain and palpitations.  Gastrointestinal:  Negative for blood in stool, constipation and diarrhea.  Endocrine: Negative for increased urination.  Genitourinary:  Positive for involuntary  urination.  Musculoskeletal:  Positive for joint pain, gait problem, joint pain, joint swelling and morning stiffness. Negative for myalgias, muscle weakness, muscle tenderness and myalgias.  Skin:  Negative for color change, rash, hair loss and sensitivity to sunlight.  Allergic/Immunologic: Negative for susceptible to infections.  Neurological:  Negative for dizziness and headaches.  Hematological:  Negative for swollen glands.  Psychiatric/Behavioral:  Negative for depressed mood and sleep disturbance. The patient is not nervous/anxious.     PMFS History:  Patient Active Problem List   Diagnosis Date Noted  . Primary osteoarthritis of both knees 12/11/2016  . High risk medication use 06/30/2016  . Right hand pain 06/30/2016  . Right hip pain 06/30/2016  . Hepatic steatosis 06/27/2014  . BMI 50.0-59.9, adult (HCC) 06/27/2014  . Fibroid, uterine 06/27/2014  . Abdominal pain, chronic, right lower quadrant 06/27/2014  . Obesity hypoventilation syndrome (HCC) 12/08/2013  . Nocturnal seizures (HCC) 12/08/2013  . OSA (obstructive sleep apnea) 12/08/2013  . Noncompliance with CPAP treatment 12/08/2013  . Psoriasis 11/29/2013  . Seizure disorder, complex partial (HCC) 06/01/2012  . Migraine 06/01/2012  . OSA on CPAP 01/04/2012  . Hiatal hernia 03/26/2011  . Internal hemorrhoid 03/24/2011  . Reflux esophagitis 03/24/2011  . Psoriatic arthritis (HCC) 01/22/2011  . Hernia of abdominal wall 01/22/2011    Past Medical History:  Diagnosis Date  . Allergic rhinitis, cause unspecified   . Anal fissure   . Arthritis  inflammatory and psoriatic--Dr. Corliss Skains  . Asthma    related to allergies and colds  . Chronic bronchitis    gets bronchitis yearly with colds  . Chronic kidney disease kidney stones  . Fracture    stress fracture right foot  . GERD (gastroesophageal reflux disease)   . Hernia (acquired) (recurrent)    R lower abdomen; has seen [facility] (Dr. Ephriam Knuckles and recurred   . Hiatal hernia 03/2011  . Impaired fasting glucose   . Internal hemorrhoids 03/2011  . Menstrual migraine    Dr, Anne Hahn  . Obesity   . Reflux esophagitis 03/2011  . Seizure disorder (HCC)    f/b Dr. Anne Hahn  . Seizures (HCC) last seisure june 2012  . Sleep apnea    inconclusive test per pt (some central and obstructive component)  . Sleep apnea 06/07/09   mild complex sleep apnea, worse in supine position (uses CPAP intermittently only)  . Tinea cruris 7/09    Family History  Problem Relation Age of Onset  . Arthritis Mother        rheumatoid  . Hyperlipidemia Mother   . Hypertension Mother   . Hyperthyroidism Mother        s/p thyroidectomy, now with hypothyroidism  . Cancer Father 46       AML, lung cancer, kidney cancer (all presented at same time)  . Diabetes Father   . Hypertension Father   . Hyperlipidemia Father   . Psoriasis Father   . Anxiety disorder Father        OCD, nervous breakdown  . Psoriasis Sister   . Irritable bowel syndrome Sister   . Arthritis Sister        psoriatic  . Diabetes Sister   . Stroke Maternal Aunt   . Diabetes Paternal Uncle   . Crohn's disease Paternal Uncle   . Cancer Paternal Uncle        ? type  . Colitis Paternal Uncle   . Asthma Maternal Grandmother   . Hyperlipidemia Maternal Grandmother   . Heart disease Maternal Grandmother   . Stroke Maternal Grandmother   . Diabetes Maternal Grandfather   . Heart disease Maternal Grandfather   . Cancer Paternal Grandmother 98       female (?ovarian)  . Diabetes Paternal Grandmother   . Hypertension Paternal Grandmother   . Diabetes Paternal Grandfather   . Heart disease Paternal Grandfather   . Hypertension Paternal Grandfather   . GER disease Daughter   . Asthma Son   . Stroke Cousin   . Turner syndrome Niece   . Esophageal cancer Neg Hx   . Stomach cancer Neg Hx    Past Surgical History:  Procedure Laterality Date  . BARIATRIC SURGERY  09/22/2017  . CESAREAN SECTION      x2  . CHOLECYSTECTOMY  2005  . COLONOSCOPY  03/19/11   Dr. Juanda Chance; internal hemorrhoids  . ESOPHAGOGASTRODUODENOSCOPY  03/19/11   hiatal hernia, esophagitis  . KIDNEY STONE SURGERY  08/2010   retrieved with basket (at [facility])  . repair of incisional hernia  2005, 2008   related to laparoscopic cholecystectomy   Social History   Social History Narrative   Patient Lives with husband Elijah Birk)  and 2 children (son and daughter, 58, 44)   Patient is right handed.   Patient drinks 2 cups daily- coffee/soda            Immunization History  Administered Date(s) Administered  . Influenza Split 12/29/2010  .  Influenza,inj,Quad PF,6+ Mos 11/29/2013  . Tdap 11/29/2013     Objective: Vital Signs: BP 113/78 (BP Location: Left Arm, Patient Position: Sitting, Cuff Size: Large)   Pulse 69   Resp 16   Ht 5\' 2"  (1.575 m)   Wt 280 lb 9.6 oz (127.3 kg)   LMP 06/12/2014   BMI 51.32 kg/m    Physical Exam Vitals and nursing note reviewed.  Constitutional:      Appearance: She is well-developed.  HENT:     Head: Normocephalic and atraumatic.  Eyes:     Conjunctiva/sclera: Conjunctivae normal.  Cardiovascular:     Rate and Rhythm: Normal rate and regular rhythm.     Heart sounds: Normal heart sounds.  Pulmonary:     Effort: Pulmonary effort is normal.     Breath sounds: Normal breath sounds.  Abdominal:     General: Bowel sounds are normal.     Palpations: Abdomen is soft.  Musculoskeletal:     Cervical back: Normal range of motion.  Lymphadenopathy:     Cervical: No cervical adenopathy.  Skin:    General: Skin is warm and dry.     Capillary Refill: Capillary refill takes less than 2 seconds.  Neurological:     Mental Status: She is alert and oriented to person, place, and time.  Psychiatric:        Behavior: Behavior normal.     Musculoskeletal Exam: C-spine has good range of motion.  Thoracic kyphosis noted.  No SI joint tenderness upon palpation.  Shoulder joints have good  range of motion with slightly limited internal rotation.  Elbow joints have good range of motion.  Some tenderness along the ulnar aspect of both wrists.  Tenderness of the right 2nd through 4th PIP joints.  Hip joints have good range of motion with no groin pain.  Discomfort with range of motion of the right knee.  Mild warmth of the right knee but no effusion.  Ankle joints have good range of motion.  Pedal edema noted bilateral lower extremities.  No evidence of Achilles tendinitis or plantar fasciitis.  CDAI Exam: CDAI Score: -- Patient Global: --; Provider Global: -- Swollen: --; Tender: -- Joint Exam 05/17/2023   No joint exam has been documented for this visit   There is currently no information documented on the homunculus. Go to the Rheumatology activity and complete the homunculus joint exam.  Investigation: No additional findings.  Imaging: No results found.  Recent Labs: Lab Results  Component Value Date   WBC 6.3 11/12/2022   HGB 12.8 11/12/2022   PLT 356 11/12/2022   NA 141 11/12/2022   K 4.2 11/12/2022   CL 103 11/12/2022   CO2 29 11/12/2022   GLUCOSE 91 11/12/2022   BUN 23 11/12/2022   CREATININE 0.71 11/12/2022   BILITOT 0.4 11/12/2022   ALKPHOS 75 10/16/2016   AST 22 11/12/2022   ALT 23 11/12/2022   PROT 7.1 11/12/2022   ALBUMIN 4.2 10/16/2016   CALCIUM 9.1 11/12/2022   GFRAA 114 09/18/2020   QFTBGOLDPLUS NEGATIVE 07/20/2022    Speciality Comments: TB Gold: 10/16/16 Neg Failed Cosentyx-developed rash that sent her to the ER Does not want to try TNF-inhibitor due to neurologic risk since she has epilepsy  Procedures:  No procedures performed Allergies: Cosentyx [secukinumab], Hydrocodone, Vimpat [lacosamide], Amoxicillin, Codeine, Keppra [levetiracetam], Sulfa antibiotics, Transderm-scop [scopolamine], and Tape    Assessment / Plan:     Visit Diagnoses: Psoriatic arthritis [facility]): Patient presents today with ongoing soreness  and stiffness involving  both hands, both wrists, and the right knee joint.  She remains on Taltz 80 mg sq injections every 28 days, Otrexup 20 mg sq injections once weekly, and folic acid 2 mg daily.  She is tolerating combination therapy without any side effects or recurrent infections. No flares of active psoriasis.  No SI joint tenderness upon palpation.  No evidence of Achilles tendinitis or plantar fasciitis.  She has been taking 2 capsules of Aleve in the morning for symptomatic relief.  Mild warmth was noted in the right knee.  She has tenderness along the ulnar aspect of both wrist joints.  No medication changes will be made at this time.  She is vies notify us if she develops signs or symptoms of a flare.  She will follow-up in the office in 3 months or sooner if needed.  Psoriasis - Umbilical and gluteal region.  Well-controlled on Taltz and Otrexup as combination therapy.  High risk medication use - Taltz 80 mg sq injections every 28 days-initiated on 02/10/2022, Otrexup 20 mg sq injections once weekly, and folic acid 2 mg daily. Inadequate response to Simponi.   D/c Cosentyx due to rash.  CBC and CMP updated on 01/07/23.  Orders for CBC and CMP released today. Her next lab work will be due in June and every 3 months.  TB gold negative on 07/20/22.  No recent or recurrent infections. Discussed the importance of holding taltz and otrexup if she develops signs or symptoms of an infection and to resume once the infection has completely cleared.  - Plan: CBC with Differential/Platelet, COMPLETE METABOLIC PANEL WITH GFR  HLA B27 positive  Piriformis syndrome, left: Not currently symptomatic.   Pain in both hands - XR of both hands were updated on 11/26/2021 which were consistent w/ severe erosive psoriatic arthritis w/ radiographic progression compared to x-rays from 2021.  Patient continues to have chronic pain and stiffness involving both hands.  She has tenderness along the ulnar aspect of both wrist joints as well  as tenderness of the right 2nd through 4th PIP joints.  Incomplete fist formation noted bilaterally.  Chronic pain of right knee - X-rays of the right knee were consistent with moderate chondromalacia patella with patellar erosion on 11/26/2021.  Warmth but no effusion noted.  She has been working on weight loss with a dietitian.  Primary osteoarthritis of both feet: She is not experiencing any increased discomfort in her feet at this time.  No evidence of Achilles tendinitis or plantar fasciitis.  Pedal edema noted in bilateral lower extremities.  Other medical conditions are listed as follows:   History of sleep apnea  Skin yeast infection  History of cholecystectomy  History of asthma  History of epilepsy  History of gastric bypass  History of hernia repair  Family history of rheumatoid arthritis  Orders: Orders Placed This Encounter  Procedures  . CBC with Differential/Platelet  . COMPLETE METABOLIC PANEL WITH GFR   No orders of the defined types were placed in this encounter.    Follow-Up Instructions: Return in about 3 months (around 08/17/2023) for Psoriatic arthritis.   Gearldine Bienenstock, PA-C  Note - This record has been created using Dragon software.  Chart creation errors have been sought, but may not always  have been located. Such creation errors do not reflect on  the standard of medical care.

## 2023-05-17 ENCOUNTER — Encounter: Payer: Self-pay | Admitting: Physician Assistant

## 2023-05-17 ENCOUNTER — Ambulatory Visit: Payer: BC Managed Care – PPO | Attending: Physician Assistant | Admitting: Physician Assistant

## 2023-05-17 VITALS — BP 113/78 | HR 69 | Resp 16 | Ht 62.0 in | Wt 280.6 lb

## 2023-05-17 DIAGNOSIS — G8929 Other chronic pain: Secondary | ICD-10-CM

## 2023-05-17 DIAGNOSIS — L409 Psoriasis, unspecified: Secondary | ICD-10-CM | POA: Diagnosis not present

## 2023-05-17 DIAGNOSIS — B372 Candidiasis of skin and nail: Secondary | ICD-10-CM

## 2023-05-17 DIAGNOSIS — Z9049 Acquired absence of other specified parts of digestive tract: Secondary | ICD-10-CM

## 2023-05-17 DIAGNOSIS — Z8709 Personal history of other diseases of the respiratory system: Secondary | ICD-10-CM

## 2023-05-17 DIAGNOSIS — Z79899 Other long term (current) drug therapy: Secondary | ICD-10-CM | POA: Diagnosis not present

## 2023-05-17 DIAGNOSIS — M79642 Pain in left hand: Secondary | ICD-10-CM

## 2023-05-17 DIAGNOSIS — M79641 Pain in right hand: Secondary | ICD-10-CM

## 2023-05-17 DIAGNOSIS — Z9889 Other specified postprocedural states: Secondary | ICD-10-CM

## 2023-05-17 DIAGNOSIS — G5702 Lesion of sciatic nerve, left lower limb: Secondary | ICD-10-CM

## 2023-05-17 DIAGNOSIS — L4050 Arthropathic psoriasis, unspecified: Secondary | ICD-10-CM

## 2023-05-17 DIAGNOSIS — Z9884 Bariatric surgery status: Secondary | ICD-10-CM

## 2023-05-17 DIAGNOSIS — Z1589 Genetic susceptibility to other disease: Secondary | ICD-10-CM

## 2023-05-17 DIAGNOSIS — M25561 Pain in right knee: Secondary | ICD-10-CM

## 2023-05-17 DIAGNOSIS — Z8261 Family history of arthritis: Secondary | ICD-10-CM

## 2023-05-17 DIAGNOSIS — M19072 Primary osteoarthritis, left ankle and foot: Secondary | ICD-10-CM

## 2023-05-17 DIAGNOSIS — Z8719 Personal history of other diseases of the digestive system: Secondary | ICD-10-CM

## 2023-05-17 DIAGNOSIS — M19071 Primary osteoarthritis, right ankle and foot: Secondary | ICD-10-CM

## 2023-05-17 DIAGNOSIS — Z8669 Personal history of other diseases of the nervous system and sense organs: Secondary | ICD-10-CM

## 2023-05-18 LAB — CBC WITH DIFFERENTIAL/PLATELET
Absolute Lymphocytes: 1995 {cells}/uL (ref 850–3900)
Absolute Monocytes: 537 {cells}/uL (ref 200–950)
Basophils Absolute: 49 {cells}/uL (ref 0–200)
Basophils Relative: 0.8 %
Eosinophils Absolute: 49 {cells}/uL (ref 15–500)
Eosinophils Relative: 0.8 %
HCT: 40 % (ref 35.0–45.0)
Hemoglobin: 12.6 g/dL (ref 11.7–15.5)
MCH: 26 pg — ABNORMAL LOW (ref 27.0–33.0)
MCHC: 31.5 g/dL — ABNORMAL LOW (ref 32.0–36.0)
MCV: 82.6 fL (ref 80.0–100.0)
MPV: 9.3 fL (ref 7.5–12.5)
Monocytes Relative: 8.8 %
Neutro Abs: 3471 {cells}/uL (ref 1500–7800)
Neutrophils Relative %: 56.9 %
Platelets: 366 10*3/uL (ref 140–400)
RBC: 4.84 10*6/uL (ref 3.80–5.10)
RDW: 15 % (ref 11.0–15.0)
Total Lymphocyte: 32.7 %
WBC: 6.1 10*3/uL (ref 3.8–10.8)

## 2023-05-18 LAB — COMPLETE METABOLIC PANEL WITH GFR
AG Ratio: 1.4 (calc) (ref 1.0–2.5)
ALT: 18 U/L (ref 6–29)
AST: 19 U/L (ref 10–35)
Albumin: 4.1 g/dL (ref 3.6–5.1)
Alkaline phosphatase (APISO): 80 U/L (ref 37–153)
BUN: 14 mg/dL (ref 7–25)
CO2: 28 mmol/L (ref 20–32)
Calcium: 9.2 mg/dL (ref 8.6–10.4)
Chloride: 105 mmol/L (ref 98–110)
Creat: 0.76 mg/dL (ref 0.50–1.03)
Globulin: 3 g/dL (ref 1.9–3.7)
Glucose, Bld: 95 mg/dL (ref 65–99)
Potassium: 4.6 mmol/L (ref 3.5–5.3)
Sodium: 142 mmol/L (ref 135–146)
Total Bilirubin: 0.2 mg/dL (ref 0.2–1.2)
Total Protein: 7.1 g/dL (ref 6.1–8.1)
eGFR: 91 mL/min/{1.73_m2} (ref >=60)

## 2023-05-18 NOTE — Progress Notes (Signed)
 MCH and MCHC remain low but improved. Rest of CBC WNL.  CMP WNL

## 2023-06-04 ENCOUNTER — Telehealth: Payer: Self-pay | Admitting: Rheumatology

## 2023-06-04 NOTE — Telephone Encounter (Signed)
 Called patient to advised per Nicholos Johns, pulled account from collections and reassigned the account for follow up to investigate the OON EOB from [employer]. Patient expressed understanding and was thankful.

## 2023-06-04 NOTE — Telephone Encounter (Signed)
 Pt called stating she got something in the mail that stated [employer] has Sherron Ales OON. Pt is upset that she had got a big bill and not one for Dr. Corliss Skains. Pt stated she was confused about this and would like to know what to do.

## 2023-06-07 ENCOUNTER — Other Ambulatory Visit: Payer: Self-pay | Admitting: Physician Assistant

## 2023-06-07 ENCOUNTER — Other Ambulatory Visit: Payer: Self-pay

## 2023-06-07 DIAGNOSIS — L4050 Arthropathic psoriasis, unspecified: Secondary | ICD-10-CM

## 2023-06-07 DIAGNOSIS — L409 Psoriasis, unspecified: Secondary | ICD-10-CM

## 2023-06-07 DIAGNOSIS — Z79899 Other long term (current) drug therapy: Secondary | ICD-10-CM

## 2023-06-07 NOTE — Telephone Encounter (Signed)
 Last Fill: 02/25/2023  Labs: 05/17/2023 MCH and MCHC remain low but improved. Rest of CBC WNL.  CMP WNL   TB Gold: 07/20/2022 Neg    Next Visit: 08/16/2023  Last Visit: 05/17/2023  [id number] arthritis   Current Dose per office note 05/17/2023: Taltz 80 mg sq injections every 28 days   Okay to refill Taltz?

## 2023-07-07 ENCOUNTER — Other Ambulatory Visit: Payer: Self-pay | Admitting: *Deleted

## 2023-07-07 ENCOUNTER — Telehealth: Payer: Self-pay | Admitting: Rheumatology

## 2023-07-07 MED ORDER — PREDNISONE 5 MG PO TABS: ORAL_TABLET | ORAL | 0 refills | Status: DC

## 2023-07-07 NOTE — Telephone Encounter (Signed)
 Patient called stating she is having a lot of pain in both wrists and her right hand especially her fingers. Patient is requesting a prescription of Prednisone  (taper dose).  Patient's pharmacy is [facility] at [address] in [place].

## 2023-07-07 NOTE — Telephone Encounter (Signed)
 I called patient, RX sent to pharmacy.

## 2023-07-07 NOTE — Telephone Encounter (Signed)
 Ok to send in prednisone 20 mg tapering by 5 mg every 4 days.  Take prednisone in the morning with food and avoid the use of NSAIDs

## 2023-07-19 DIAGNOSIS — E8889 Other specified metabolic disorders: Secondary | ICD-10-CM | POA: Diagnosis not present

## 2023-07-19 DIAGNOSIS — R638 Other symptoms and signs concerning food and fluid intake: Secondary | ICD-10-CM | POA: Diagnosis not present

## 2023-07-19 DIAGNOSIS — R635 Abnormal weight gain: Secondary | ICD-10-CM | POA: Diagnosis not present

## 2023-07-19 DIAGNOSIS — E559 Vitamin D deficiency, unspecified: Secondary | ICD-10-CM | POA: Diagnosis not present

## 2023-07-19 DIAGNOSIS — R7303 Prediabetes: Secondary | ICD-10-CM | POA: Diagnosis not present

## 2023-07-19 DIAGNOSIS — M2550 Pain in unspecified joint: Secondary | ICD-10-CM | POA: Diagnosis not present

## 2023-07-22 DIAGNOSIS — J40 Bronchitis, not specified as acute or chronic: Secondary | ICD-10-CM | POA: Diagnosis not present

## 2023-08-02 ENCOUNTER — Other Ambulatory Visit: Payer: Self-pay | Admitting: *Deleted

## 2023-08-02 MED ORDER — PREDNISONE 5 MG PO TABS: ORAL_TABLET | ORAL | 0 refills | Status: DC

## 2023-08-02 NOTE — Progress Notes (Signed)
 Office Visit Note  Patient: Tiffany Wilkins             Date of Birth: Oct 12, 1965           MRN: [id number]             PCP: Felipa Horsfall, DO Referring: Felipa Horsfall, DO Visit Date: 08/16/2023 Occupation: @GUAROCC @  Subjective:  Pain in both hands   History of Present Illness: Tiffany Wilkins is a 58 y.o. female with history of psoriatic arthritis.  Patient remains on  Taltz  80 mg sq injections every 28 days-initiated on 02/10/2022, Otrexup  20 mg sq weekly, and folic acid  2 mg daily.  She is tolerating combination therapy without any side effects.  She recently had a gap in therapy while taking a Z-Pak for her lung infection.  Patient states that due to the recent gap in therapy as well as rainy weather she has had more frequent flares.  Patient states that she has been on 2 courses of prednisone  recently.  Patient states that her symptoms are better controlled when taking 15 mg of prednisone  but when she tapers her symptoms return.  This morning she took 10 mg of prednisone .  She continues to have ongoing pain and inflammation in the left wrist, both hands, and both knee joints.  She denies any Achilles tendinitis or plantar fasciitis.  She denies any SI joint pain.  She states her psoriasis is currently well controlled.  She has not needed to use any topical agents recently.    Activities of Daily Living:  Patient reports morning stiffness for 45-60 minutes.   Patient Reports nocturnal pain.  Difficulty dressing/grooming: Reports Difficulty climbing stairs: Reports Difficulty getting out of chair: Reports Difficulty using hands for taps, buttons, cutlery, and/or writing: Reports  Review of Systems  Constitutional:  Positive for fatigue.  HENT:  Negative for mouth sores and mouth dryness.   Eyes:  Negative for dryness.  Respiratory:  Positive for shortness of breath.   Cardiovascular:  Positive for chest pain. Negative for palpitations.  Gastrointestinal:  Negative for blood in  stool, constipation and diarrhea.  Endocrine: Positive for increased urination.  Genitourinary:  Negative for involuntary urination.  Musculoskeletal:  Positive for joint pain, joint pain, joint swelling, muscle weakness and morning stiffness. Negative for gait problem, myalgias, muscle tenderness and myalgias.  Skin:  Negative for color change, rash, hair loss and sensitivity to sunlight.  Allergic/Immunologic: Positive for susceptible to infections.  Neurological:  Positive for headaches. Negative for dizziness.  Hematological:  Negative for swollen glands.  Psychiatric/Behavioral:  Negative for depressed mood and sleep disturbance. The patient is not nervous/anxious.     PMFS History:  Patient Active Problem List   Diagnosis Date Noted   Primary osteoarthritis of both knees 12/11/2016   High risk medication use 06/30/2016   Right hand pain 06/30/2016   Right hip pain 06/30/2016   Hepatic steatosis 06/27/2014   BMI 50.0-59.9, adult (HCC) 06/27/2014   Fibroid, uterine 06/27/2014   Abdominal pain, chronic, right lower quadrant 06/27/2014   Obesity hypoventilation syndrome (HCC) 12/08/2013   Nocturnal seizures (HCC) 12/08/2013   OSA (obstructive sleep apnea) 12/08/2013   Noncompliance with CPAP treatment 12/08/2013   Psoriasis 11/29/2013   Seizure disorder, complex partial (HCC) 06/01/2012   Migraine 06/01/2012   OSA on CPAP 01/04/2012   Hiatal hernia 03/26/2011   Internal hemorrhoid 03/24/2011   Reflux esophagitis 03/24/2011   Psoriatic arthritis (HCC) 01/22/2011   Hernia of abdominal  wall 01/22/2011    Past Medical History:  Diagnosis Date   Allergic rhinitis, cause unspecified    Anal fissure    Arthritis    inflammatory and psoriatic--Dr. Alvira Josephs   Asthma    related to allergies and colds   Chronic bronchitis    gets bronchitis yearly with colds   Chronic kidney disease kidney stones   Fracture    stress fracture right foot   GERD (gastroesophageal reflux  disease)    Hernia (acquired) (recurrent)    R lower abdomen; has seen [facility] (Dr. Marten Skipper and recurred   Hiatal hernia 03/2011   Impaired fasting glucose    Internal hemorrhoids 03/2011   Menstrual migraine    Dr, Tilda Fogo   Obesity    Reflux esophagitis 03/2011   Seizure disorder (HCC)    f/b Dr. Tilda Fogo   Seizures [facility]) last seisure june 2012   Sleep apnea    inconclusive test per pt (some central and obstructive component)   Sleep apnea 06/07/09   mild complex sleep apnea, worse in supine position (uses CPAP intermittently only)   Tinea cruris 7/09    Family History  Problem Relation Age of Onset   Arthritis Mother        rheumatoid   Hyperlipidemia Mother    Hypertension Mother    Hyperthyroidism Mother        s/p thyroidectomy, now with hypothyroidism   Cancer Father 10       AML, lung cancer, kidney cancer (all presented at same time)   Diabetes Father    Hypertension Father    Hyperlipidemia Father    Psoriasis Father    Anxiety disorder Father        OCD, nervous breakdown   Psoriasis Sister    Irritable bowel syndrome Sister    Arthritis Sister        psoriatic   Diabetes Sister    Stroke Maternal Aunt    Diabetes Paternal Uncle    Crohn's disease Paternal Uncle    Cancer Paternal Uncle        ? type   Colitis Paternal Uncle    Asthma Maternal Grandmother    Hyperlipidemia Maternal Grandmother    Heart disease Maternal Grandmother    Stroke Maternal Grandmother    Diabetes Maternal Grandfather    Heart disease Maternal Grandfather    Cancer Paternal Grandmother 60       female (?ovarian)   Diabetes Paternal Grandmother    Hypertension Paternal Grandmother    Diabetes Paternal Grandfather    Heart disease Paternal Grandfather    Hypertension Paternal Grandfather    GER disease Daughter    Asthma Son    Stroke Cousin    Turner syndrome Niece    Esophageal cancer Neg Hx    Stomach cancer Neg Hx    Past Surgical History:  Procedure Laterality  Date   BARIATRIC SURGERY  09/22/2017   CESAREAN SECTION     x2   CHOLECYSTECTOMY  2005   COLONOSCOPY  03/19/11   Dr. Grandville Lax; internal hemorrhoids   ESOPHAGOGASTRODUODENOSCOPY  03/19/11   hiatal hernia, esophagitis   KIDNEY STONE SURGERY  08/2010   retrieved with basket (at [facility])   repair of incisional hernia  2005, 2008   related to laparoscopic cholecystectomy   Social History   Social History Narrative   Patient Lives with husband Hinton Luis)  and 2 children (son and daughter, 75, 18)   Patient is right handed.   Patient  drinks 2 cups daily- coffee/soda            Immunization History  Administered Date(s) Administered   Influenza Split 12/29/2010   Influenza,inj,Quad PF,6+ Mos 11/29/2013   Tdap 11/29/2013     Objective: Vital Signs: BP 132/89 (BP Location: Left Arm, Patient Position: Sitting, Cuff Size: Normal)   Pulse 73   Resp 16   Ht 5\' 2"  (1.575 m)   Wt 279 lb 3.2 oz (126.6 kg)   LMP 06/12/2014   BMI 51.07 kg/m    Physical Exam Vitals and nursing note reviewed.  Constitutional:      Appearance: She is well-developed.  HENT:     Head: Normocephalic and atraumatic.  Eyes:     Conjunctiva/sclera: Conjunctivae normal.  Cardiovascular:     Rate and Rhythm: Normal rate and regular rhythm.     Heart sounds: Normal heart sounds.  Pulmonary:     Effort: Pulmonary effort is normal.     Breath sounds: Normal breath sounds.  Abdominal:     General: Bowel sounds are normal.     Palpations: Abdomen is soft.  Musculoskeletal:     Cervical back: Normal range of motion.  Lymphadenopathy:     Cervical: No cervical adenopathy.  Skin:    General: Skin is warm and dry.     Capillary Refill: Capillary refill takes less than 2 seconds.  Neurological:     Mental Status: She is alert and oriented to person, place, and time.  Psychiatric:        Behavior: Behavior normal.      Musculoskeletal Exam: C-spine, thoracic spine, and lumbar spine have good range of motion.   No midline spinal tenderness.  No SI joint tenderness.  Shoulder joints and elbow joints have good range of motion with some tenderness along the left elbow joint line.  Tenderness inflammation noted on the ulnar aspect of the left wrist.  No tenderness or synovitis over MCP joints.  Diffuse edema noted in both hands.  Incomplete fist formation noted bilaterally.  Hip joints have good range of motion with no groin pain.  Knee joints have good range of motion with no warmth or effusion.  Ankle joints have good range of motion with no tenderness or joint swelling. Pedal edema noted bilaterally.  No evidence of achilles tendonitis or plantar fasciitis.   CDAI Exam: CDAI Score: -- Patient Global: --; Provider Global: -- Swollen: --; Tender: -- Joint Exam 08/16/2023   No joint exam has been documented for this visit   There is currently no information documented on the homunculus. Go to the Rheumatology activity and complete the homunculus joint exam.  Investigation: No additional findings.  Imaging: No results found.  Recent Labs: Lab Results  Component Value Date   WBC 6.1 05/17/2023   HGB 12.6 05/17/2023   PLT 366 05/17/2023   NA 142 05/17/2023   K 4.6 05/17/2023   CL 105 05/17/2023   CO2 28 05/17/2023   GLUCOSE 95 05/17/2023   BUN 14 05/17/2023   CREATININE 0.76 05/17/2023   BILITOT 0.2 05/17/2023   ALKPHOS 75 10/16/2016   AST 19 05/17/2023   ALT 18 05/17/2023   PROT 7.1 05/17/2023   ALBUMIN 4.2 10/16/2016   CALCIUM 9.2 05/17/2023   GFRAA 114 09/18/2020   QFTBGOLDPLUS NEGATIVE 07/20/2022    Speciality Comments: TB Gold: 10/16/16 Neg Failed Cosentyx -developed rash that sent her to the ER Does not want to try TNF-inhibitor due to neurologic risk since she has epilepsy  Procedures:  No procedures performed Allergies: Cosentyx  [secukinumab ], Hydrocodone, Vimpat  [lacosamide ], Amoxicillin, Codeine, Keppra [levetiracetam], Sulfa antibiotics, Transderm-scop [scopolamine], and  Tape     Assessment / Plan:     Visit Diagnoses: Psoriatic arthritis [facility]): Patient presents today experiencing a flare involving the left wrist, both hands, and both knee joints.  Patient remains on Taltz  80 mg subcu injections every 28 days, Otrexup  20 mg subcu injections once weekly.  Patient recently had an infection and was treated with a Z-Pak.  She had to postpone both injections and since then has had an exacerbation of symptoms.  She also attributes the increase in joint pain and inflammation to the rainy weather recently.  A prednisone  taper was sent to the pharmacy on 08/02/2023 which has helped to alleviate her symptoms.  This morning she took 10 mg of prednisone  but overall feels that her symptoms are better controlled when taking 15 mg of prednisone  daily.  Discussed the concern for frequent and long-term prednisone  use.  Patient requested 1 more prednisone  taper to take if needed if her symptoms do not improve.  She would like to remain on Taltz  and Otrexup  as prescribed.  Her psoriasis is currently well-controlled on the current treatment regimen.  She will notify us  if she continues to have frequent and severe flares.  She will follow-up in the office in 3 months or sooner if needed.  Psoriasis - Umbilical and gluteal region.  Well-controlled on Taltz  and Otrexup  as combination therapy. Not currently using topical agents.    High risk medication use - Taltz  80 mg sq injections every 28 days-initiated on 02/10/2022, Otrexup  20 mg sq weekly,folic acid  2 mg daily.IR to [place] .  D/c Cosentyx  due to rash. CBC and CMP updated on 05/17/23.  Orders for CBC and CMP released today.  Her next lab work will be due in 3 months.  TB gold negative on 07/20/22. Order for TB gold released today.   Discussed the importance of holding taltz  and otrexup  if she develops signs or symptoms of an infection and to resume once the infection has completely cleared.    - Plan: CBC with Differential/Platelet,  Comprehensive metabolic panel with GFR, QuantiFERON-TB Gold Plus  Screening for tuberculosis - Order for TB gold released today.  Plan: QuantiFERON-TB Gold Plus  HLA B27 positive  Piriformis syndrome, left: Not currently symptomatic.   Pain in both hands - XR of both hands were updated on 11/26/2021 which were consistent w/ severe erosive psoriatic arthritis w/ radiographic progression compared to x-rays from 2021.  Patient continues to have chronic pain and intermittent inflammation in both hands and the left wrist.  She has tenderness and inflammation on the ulnar aspect of the left wrist.  Patient requested a prednisone  taper to be sent to the pharmacy today to take if needed.   Chronic pain of right knee - X-rays of the right knee were consistent with moderate chondromalacia patella with patellar erosion on 11/26/2021. Chronic pain.  Intermittent warmth and swelling.   Primary osteoarthritis of both feet: No evidence of Achilles tendinitis or plantar fasciitis.  Pedal edema noted in bilateral feet.    Other medical conditions are listed as follows:   History of sleep apnea  Skin yeast infection: Resolved.   History of cholecystectomy  History of asthma  History of epilepsy  History of gastric bypass  History of hernia repair  Family history of rheumatoid arthritis    Orders: Orders Placed This Encounter  Procedures   CBC with Differential/Platelet  Comprehensive metabolic panel with GFR   QuantiFERON-TB Gold Plus   Meds ordered this encounter  Medications   predniSONE  (DELTASONE ) 5 MG tablet    Sig: Take 4 tabs po x 4 days, 3  tabs po x 4 days, 2  tabs po x 4 days, 1  tab po x 4 days    Dispense:  40 tablet    Refill:  0    Follow-Up Instructions: Return in about 3 months (around 11/16/2023) for Psoriatic arthritis.   Romayne Clubs, PA-C  Note - This record has been created using Dragon software.  Chart creation errors have been sought, but may not always   have been located. Such creation errors do not reflect on  the standard of medical care.

## 2023-08-02 NOTE — Addendum Note (Signed)
 Addended by: Adrianne Horn on: 08/02/2023 01:24 PM   Modules accepted: Orders

## 2023-08-02 NOTE — Telephone Encounter (Signed)
Ok to send in prednisone 20 mg tapering by 5 mg every 4 days.

## 2023-08-02 NOTE — Telephone Encounter (Signed)
 Patient advised we are going to send a prednisone  taper to the pharmacy for her.

## 2023-08-02 NOTE — Telephone Encounter (Signed)
 Patient states she was on a prednisone  taper last month. Patient states she is having a bad flare. Patient states she has had to be off the MTX due to pneumonia. Patient states she has just been able to restart the MTX. Patient is asking for another prednisone  taper to help with her flare until the MTX gets back into her system.  Please advise.

## 2023-08-16 ENCOUNTER — Ambulatory Visit: Attending: Physician Assistant | Admitting: Physician Assistant

## 2023-08-16 ENCOUNTER — Encounter: Payer: Self-pay | Admitting: Physician Assistant

## 2023-08-16 VITALS — BP 132/89 | HR 73 | Resp 16 | Ht 62.0 in | Wt 279.2 lb

## 2023-08-16 DIAGNOSIS — Z79899 Other long term (current) drug therapy: Secondary | ICD-10-CM | POA: Diagnosis not present

## 2023-08-16 DIAGNOSIS — L4050 Arthropathic psoriasis, unspecified: Secondary | ICD-10-CM | POA: Diagnosis not present

## 2023-08-16 DIAGNOSIS — Z1589 Genetic susceptibility to other disease: Secondary | ICD-10-CM | POA: Diagnosis not present

## 2023-08-16 DIAGNOSIS — Z8709 Personal history of other diseases of the respiratory system: Secondary | ICD-10-CM

## 2023-08-16 DIAGNOSIS — Z111 Encounter for screening for respiratory tuberculosis: Secondary | ICD-10-CM | POA: Diagnosis not present

## 2023-08-16 DIAGNOSIS — L409 Psoriasis, unspecified: Secondary | ICD-10-CM

## 2023-08-16 DIAGNOSIS — M25561 Pain in right knee: Secondary | ICD-10-CM

## 2023-08-16 DIAGNOSIS — B372 Candidiasis of skin and nail: Secondary | ICD-10-CM

## 2023-08-16 DIAGNOSIS — Z9884 Bariatric surgery status: Secondary | ICD-10-CM

## 2023-08-16 DIAGNOSIS — Z9889 Other specified postprocedural states: Secondary | ICD-10-CM

## 2023-08-16 DIAGNOSIS — G5702 Lesion of sciatic nerve, left lower limb: Secondary | ICD-10-CM

## 2023-08-16 DIAGNOSIS — M19072 Primary osteoarthritis, left ankle and foot: Secondary | ICD-10-CM

## 2023-08-16 DIAGNOSIS — Z8261 Family history of arthritis: Secondary | ICD-10-CM

## 2023-08-16 DIAGNOSIS — G8929 Other chronic pain: Secondary | ICD-10-CM

## 2023-08-16 DIAGNOSIS — M19071 Primary osteoarthritis, right ankle and foot: Secondary | ICD-10-CM

## 2023-08-16 DIAGNOSIS — Z8669 Personal history of other diseases of the nervous system and sense organs: Secondary | ICD-10-CM

## 2023-08-16 DIAGNOSIS — M79641 Pain in right hand: Secondary | ICD-10-CM

## 2023-08-16 DIAGNOSIS — Z9049 Acquired absence of other specified parts of digestive tract: Secondary | ICD-10-CM

## 2023-08-16 DIAGNOSIS — Z8719 Personal history of other diseases of the digestive system: Secondary | ICD-10-CM

## 2023-08-16 DIAGNOSIS — M79642 Pain in left hand: Secondary | ICD-10-CM

## 2023-08-16 MED ORDER — PREDNISONE 5 MG PO TABS: ORAL_TABLET | ORAL | 0 refills | Status: DC

## 2023-08-17 ENCOUNTER — Ambulatory Visit: Payer: Self-pay | Admitting: Physician Assistant

## 2023-08-17 NOTE — Progress Notes (Signed)
 CMP WNL MCH and MCHC low but stable. Recommend increase in dietary iron intake.

## 2023-08-19 LAB — QUANTIFERON-TB GOLD PLUS
Mitogen-NIL: 9.06 [IU]/mL
NIL: 0.03 [IU]/mL
QuantiFERON-TB Gold Plus: NEGATIVE
TB1-NIL: 0.01 [IU]/mL
TB2-NIL: 0 [IU]/mL

## 2023-08-19 LAB — CBC WITH DIFFERENTIAL/PLATELET
Absolute Lymphocytes: 1340 {cells}/uL (ref 850–3900)
Absolute Monocytes: 254 {cells}/uL (ref 200–950)
Basophils Absolute: 62 {cells}/uL (ref 0–200)
Basophils Relative: 0.8 %
Eosinophils Absolute: 23 {cells}/uL (ref 15–500)
Eosinophils Relative: 0.3 %
HCT: 42 % (ref 35.0–45.0)
Hemoglobin: 13 g/dL (ref 11.7–15.5)
MCH: 26 pg — ABNORMAL LOW (ref 27.0–33.0)
MCHC: 31 g/dL — ABNORMAL LOW (ref 32.0–36.0)
MCV: 84 fL (ref 80.0–100.0)
MPV: 8.6 fL (ref 7.5–12.5)
Monocytes Relative: 3.3 %
Neutro Abs: 6021 {cells}/uL (ref 1500–7800)
Neutrophils Relative %: 78.2 %
Platelets: 392 10*3/uL (ref 140–400)
RBC: 5 10*6/uL (ref 3.80–5.10)
RDW: 17.6 % — ABNORMAL HIGH (ref 11.0–15.0)
Total Lymphocyte: 17.4 %
WBC: 7.7 10*3/uL (ref 3.8–10.8)

## 2023-08-19 LAB — COMPREHENSIVE METABOLIC PANEL WITH GFR
AG Ratio: 1.5 (calc) (ref 1.0–2.5)
ALT: 18 U/L (ref 6–29)
AST: 14 U/L (ref 10–35)
Albumin: 4.3 g/dL (ref 3.6–5.1)
Alkaline phosphatase (APISO): 82 U/L (ref 37–153)
BUN: 17 mg/dL (ref 7–25)
CO2: 29 mmol/L (ref 20–32)
Calcium: 9.1 mg/dL (ref 8.6–10.4)
Chloride: 101 mmol/L (ref 98–110)
Creat: 0.88 mg/dL (ref 0.50–1.03)
Globulin: 2.8 g/dL (ref 1.9–3.7)
Glucose, Bld: 95 mg/dL (ref 65–99)
Potassium: 4.5 mmol/L (ref 3.5–5.3)
Sodium: 138 mmol/L (ref 135–146)
Total Bilirubin: 0.3 mg/dL (ref 0.2–1.2)
Total Protein: 7.1 g/dL (ref 6.1–8.1)
eGFR: 77 mL/min/{1.73_m2} (ref >=60)

## 2023-08-19 NOTE — Progress Notes (Signed)
 TB gold negative

## 2023-09-08 ENCOUNTER — Other Ambulatory Visit: Payer: Self-pay | Admitting: Physician Assistant

## 2023-09-08 DIAGNOSIS — L409 Psoriasis, unspecified: Secondary | ICD-10-CM

## 2023-09-08 DIAGNOSIS — L4050 Arthropathic psoriasis, unspecified: Secondary | ICD-10-CM

## 2023-09-08 DIAGNOSIS — Z79899 Other long term (current) drug therapy: Secondary | ICD-10-CM

## 2023-09-08 NOTE — Telephone Encounter (Signed)
 Last Fill: 06/07/2023  Labs: 08/16/2023 CMP WNL  MCH and MCHC low but stable.   TB Gold: 08/16/2023 TB Gold Negative    Next Visit: 11/22/2023  Last Visit: 08/16/2023  IK:Ednmpjupr arthritis   Current Dose per office note 08/16/2023: Taltz  80 mg subcu injections every 28 days  Okay to refill Taltz ?

## 2023-10-07 ENCOUNTER — Ambulatory Visit: Payer: BC Managed Care – PPO | Admitting: Adult Health

## 2023-11-08 NOTE — Progress Notes (Signed)
 Office Visit Note  Patient: Tiffany Wilkins             Date of Birth: 1965-10-11           MRN: [id number]             PCP: Marinda Rocky CROME, DO Referring: Marinda Rocky CROME, DO Visit Date: 11/22/2023 Occupation: @GUAROCC @  Subjective:  Medication monitoring   History of Present Illness: Tiffany Wilkins is a 58 y.o. female with history of psoriatic arthritis.  Patient remains on Taltz  80 mg sq injections every 28 days-initiated on 02/10/2022, Otrexup  20 mg sq weekly, and folic acid  2 mg daily.  She is tolerating combination therapy without any side effects.  Patient reports that she is due for her Taltz  injection and is planning to administer it this evening.  Patient continues to experience intermittent discomfort in both wrist joints and the right ankle joint.  She has occasional discomfort in her knee joints.  Patient reports that overall the current treatment regimen has been effective at managing her symptoms.  She is having a mild flare of psoriasis under the left breast and has requested a refill of clobetasol  cream to be sent to the pharmacy today. She denies any recent or recurrent infections.  She denies any new medical conditions.  She denies any upcoming surgeries.  She is hoping to get Zepbound approved by insurance to help with weight loss.    Activities of Daily Living:  Patient reports morning stiffness for 1 hour.   Patient Reports nocturnal pain.  Difficulty dressing/grooming: Reports Difficulty climbing stairs: Reports Difficulty getting out of chair: Denies Difficulty using hands for taps, buttons, cutlery, and/or writing: Reports  Review of Systems  Constitutional:  Positive for fatigue.  HENT:  Negative for mouth sores and mouth dryness.   Eyes:  Positive for dryness.  Respiratory:  Negative for shortness of breath.   Cardiovascular:  Negative for chest pain and palpitations.  Gastrointestinal:  Negative for blood in stool, constipation and diarrhea.  Endocrine:  Negative for increased urination.  Genitourinary:  Positive for involuntary urination.  Musculoskeletal:  Positive for joint pain, joint pain, joint swelling, myalgias, morning stiffness and myalgias. Negative for gait problem, muscle weakness and muscle tenderness.  Skin:  Positive for rash. Negative for color change, hair loss and sensitivity to sunlight.  Allergic/Immunologic: Negative for susceptible to infections.  Neurological:  Positive for headaches. Negative for dizziness.  Hematological:  Negative for swollen glands.  Psychiatric/Behavioral:  Negative for depressed mood and sleep disturbance. The patient is not nervous/anxious.     PMFS History:  Patient Active Problem List   Diagnosis Date Noted   Primary osteoarthritis of both knees 12/11/2016   High risk medication use 06/30/2016   Right hand pain 06/30/2016   Right hip pain 06/30/2016   Hepatic steatosis 06/27/2014   BMI 50.0-59.9, adult (HCC) 06/27/2014   Fibroid, uterine 06/27/2014   Abdominal pain, chronic, right lower quadrant 06/27/2014   Obesity hypoventilation syndrome (HCC) 12/08/2013   Nocturnal seizures (HCC) 12/08/2013   OSA (obstructive sleep apnea) 12/08/2013   Noncompliance with CPAP treatment 12/08/2013   Psoriasis 11/29/2013   Seizure disorder, complex partial (HCC) 06/01/2012   Migraine 06/01/2012   OSA on CPAP 01/04/2012   Hiatal hernia 03/26/2011   Internal hemorrhoid 03/24/2011   Reflux esophagitis 03/24/2011   Psoriatic arthritis (HCC) 01/22/2011   Hernia of abdominal wall 01/22/2011    Past Medical History:  Diagnosis Date   Allergic rhinitis, cause  unspecified    Anal fissure    Arthritis    inflammatory and psoriatic--Dr. Dolphus   Asthma    related to allergies and colds   Chronic bronchitis    gets bronchitis yearly with colds   Chronic kidney disease kidney stones   Fracture    stress fracture right foot   GERD (gastroesophageal reflux disease)    Hernia (acquired)  (recurrent)    R lower abdomen; has seen [facility] (Dr. Fredrick and recurred   Hiatal hernia 03/2011   Impaired fasting glucose    Internal hemorrhoids 03/2011   Menstrual migraine    Dr, Jenel   Obesity    Reflux esophagitis 03/2011   Seizure disorder (HCC)    f/b Dr. Jenel   Seizures [facility]) last seisure june 2012   Sleep apnea    inconclusive test per pt (some central and obstructive component)   Sleep apnea 06/07/09   mild complex sleep apnea, worse in supine position (uses CPAP intermittently only)   Tinea cruris 7/09    Family History  Problem Relation Age of Onset   Arthritis Mother        rheumatoid   Hyperlipidemia Mother    Hypertension Mother    Hyperthyroidism Mother        s/p thyroidectomy, now with hypothyroidism   Cancer Father 48       AML, lung cancer, kidney cancer (all presented at same time)   Diabetes Father    Hypertension Father    Hyperlipidemia Father    Psoriasis Father    Anxiety disorder Father        OCD, nervous breakdown   Psoriasis Sister    Irritable bowel syndrome Sister    Arthritis Sister        psoriatic   Diabetes Sister    Stroke Maternal Aunt    Diabetes Paternal Uncle    Crohn's disease Paternal Uncle    Cancer Paternal Uncle        ? type   Colitis Paternal Uncle    Asthma Maternal Grandmother    Hyperlipidemia Maternal Grandmother    Heart disease Maternal Grandmother    Stroke Maternal Grandmother    Diabetes Maternal Grandfather    Heart disease Maternal Grandfather    Cancer Paternal Grandmother 72       female (?ovarian)   Diabetes Paternal Grandmother    Hypertension Paternal Grandmother    Diabetes Paternal Grandfather    Heart disease Paternal Grandfather    Hypertension Paternal Grandfather    GER disease Daughter    Asthma Son    Stroke Cousin    Turner syndrome Niece    Esophageal cancer Neg Hx    Stomach cancer Neg Hx    Past Surgical History:  Procedure Laterality Date   BARIATRIC SURGERY   09/22/2017   CESAREAN SECTION     x2   CHOLECYSTECTOMY  2005   COLONOSCOPY  03/19/11   Dr. Obie; internal hemorrhoids   ESOPHAGOGASTRODUODENOSCOPY  03/19/11   hiatal hernia, esophagitis   KIDNEY STONE SURGERY  08/2010   retrieved with basket (at [facility])   repair of incisional hernia  2005, 2008   related to laparoscopic cholecystectomy   Social History   Social History Narrative   Patient Lives with husband Tiffany Wilkins)  and 2 children (son and daughter, 44, 57)   Patient is right handed.   Patient drinks 2 cups daily- coffee/soda  Immunization History  Administered Date(s) Administered   Influenza Split 12/29/2010   Influenza,inj,Quad PF,6+ Mos 11/29/2013   Tdap 11/29/2013     Objective: Vital Signs: BP 138/86 (BP Location: Left Arm, Patient Position: Sitting, Cuff Size: Normal)   Pulse 71   Resp 16   Ht 5' 2 (1.575 m)   Wt 283 lb (128.4 kg)   LMP 06/12/2014   BMI 51.76 kg/m    Physical Exam Vitals and nursing note reviewed.  Constitutional:      Appearance: She is well-developed.  HENT:     Head: Normocephalic and atraumatic.  Eyes:     Conjunctiva/sclera: Conjunctivae normal.  Cardiovascular:     Rate and Rhythm: Normal rate and regular rhythm.     Heart sounds: Normal heart sounds.  Pulmonary:     Effort: Pulmonary effort is normal.     Breath sounds: Normal breath sounds.  Abdominal:     General: Bowel sounds are normal.     Palpations: Abdomen is soft.  Musculoskeletal:     Cervical back: Normal range of motion.  Lymphadenopathy:     Cervical: No cervical adenopathy.  Skin:    General: Skin is warm and dry.     Capillary Refill: Capillary refill takes less than 2 seconds.  Neurological:     Mental Status: She is alert and oriented to person, place, and time.  Psychiatric:        Behavior: Behavior normal.      Musculoskeletal Exam: C-spine, thoracic spine, lumbar spine have good range of motion.  No midline spinal tenderness.  No SI  joint tenderness.  Shoulder joints have good range of motion with no discomfort.  Elbow joints are good range of motion with no tenderness along the joint line.  Tenderness of both wrist joints but no active inflammation noted.  Diffuse edema both hands but no active synovitis or dactylitis noted.  Hip joints have good range of motion with no groin pain.  Knee joints have good range of motion with no warmth or effusion.  Tenderness of the right ankle but no synovitis noted.  Pedal edema noted bilaterally.  No evidence of Achilles tendinitis or plantar fasciitis.  CDAI Exam: CDAI Score: -- Patient Global: --; Provider Global: -- Swollen: --; Tender: -- Joint Exam 11/22/2023   No joint exam has been documented for this visit   There is currently no information documented on the homunculus. Go to the Rheumatology activity and complete the homunculus joint exam.  Investigation: No additional findings.  Imaging: No results found.  Recent Labs: Lab Results  Component Value Date   WBC 7.7 08/16/2023   HGB 13.0 08/16/2023   PLT 392 08/16/2023   NA 138 08/16/2023   K 4.5 08/16/2023   CL 101 08/16/2023   CO2 29 08/16/2023   GLUCOSE 95 08/16/2023   BUN 17 08/16/2023   CREATININE 0.88 08/16/2023   BILITOT 0.3 08/16/2023   ALKPHOS 75 10/16/2016   AST 14 08/16/2023   ALT 18 08/16/2023   PROT 7.1 08/16/2023   ALBUMIN 4.2 10/16/2016   CALCIUM 9.1 08/16/2023   GFRAA 114 09/18/2020   QFTBGOLDPLUS NEGATIVE 08/16/2023    Speciality Comments: TB Gold: 10/16/16 Neg Failed Cosentyx -developed rash that sent her to the ER Does not want to try TNF-inhibitor due to neurologic risk since she has epilepsy  Procedures:  No procedures performed Allergies: Cosentyx  [secukinumab ], Hydrocodone, Vimpat  [lacosamide ], Amoxicillin, Codeine, Keppra [levetiracetam], Sulfa antibiotics, Transderm-scop [scopolamine], and Tape    Assessment /  Plan:     Visit Diagnoses: Psoriatic arthritis (HCC): No synovitis  or dactylitis noted on examination today.  No SI joint tenderness upon palpation.  No evidence of Achilles tendinitis or plantar fasciitis.  Overall she has clinically been doing well on Taltz  80 mg sq injections once every 28 days, Otrexup  20 mg sq injections once weekly, and folic acid  2 mg daily.  No recent or recurrent infections.  No medication changes will be made at this time.  She was advised to notify us  if she develops any signs or symptoms of a flare.  She will follow-up in the office in 3 months or sooner if needed.  Psoriasis - Umbilical and gluteal region.  Well-controlled on Taltz  and Otrexup  as combination therapy.  She recently had a mild flare of psoriasis under the left breast which has improved.  She requested a refill of clobetasol  cream to keep on hand to use as needed.  Refill sent today.  Plan: clobetasol  cream (TEMOVATE ) 0.05 %  High risk medication use - Taltz  80 mg sq injections every 28 days-initiated on 02/10/2022, Otrexup  20 mg sq weekly,folic acid  2 mg daily.IR to [place] .  D/c Cosentyx  due to rash.  TB gold negative on 08/16/23.  CBC and CMP updated on 08/16/23. Orders for CBC and CMP released today. No recent or recurrent infections. Discussed the importance of holding taltz  and otrexup  if she develops signs or symptoms of an infection and to resume once the infection has completely cleared. - Plan: CBC with Differential/Platelet, Comprehensive metabolic panel with GFR  HLA B27 positive  Piriformis syndrome, left: Not currently symptomatic.  Pain in both hands - XR of both hands 11/26/2021 which were consistent w/ severe erosive psoriatic arthritis w/ radiographic progression compared to x-rays from 2021.  No synovitis noted on examination today.  Chronic pain of right knee - X-rays of the right knee were consistent with moderate chondromalacia patella with patellar erosion on 11/26/2021. Chronic pain. No warmth or effusion noted.  She has been walking some for  exercise.  Primary osteoarthritis of both feet - No evidence of Achilles tendinitis or plantar fasciitis.  She experiences intermittent discomfort in the right ankle.  No active inflammation noted on examination today.  Other medical conditions are listed as follows:  History of sleep apnea  Skin yeast infection: Not currently active.  History of cholecystectomy  History of asthma  History of epilepsy  History of gastric bypass  History of hernia repair  Family history of rheumatoid arthritis  Orders: Orders Placed This Encounter  Procedures   CBC with Differential/Platelet   Comprehensive metabolic panel with GFR   Meds ordered this encounter  Medications   clobetasol  cream (TEMOVATE ) 0.05 %    Sig: Apply 1 Application topically 2 (two) times daily.    Dispense:  45 g    Refill:  0    Follow-Up Instructions: Return in about 3 months (around 02/21/2024) for Psoriatic arthritis.   Tiffany CHRISTELLA Craze, PA-C  Note - This record has been created using Dragon software.  Chart creation errors have been sought, but may not always  have been located. Such creation errors do not reflect on  the standard of medical care.

## 2023-11-11 NOTE — Progress Notes (Signed)
 Reason for visit: Seizures, headache  Chief Complaint  Patient presents with   Migraine    Rm 8 alone Pt is well, reports no sz since last visit. She reports 3 migraines in Aug, but were manageable. New concerns.       History of present illness:  Tiffany Wilkins is a 58 year old right-handed white female with a history of well-controlled seizures and migraine headaches.  Prior patient of Dr. Jenel, now patient of Dr. Margaret. Per epic review, last seizure aura in 2019 during increased stress and missed several doses of medication. Last documented seizure activity in 2016 associated with dj vu sensation prior to seizure activity.     Interval history:  Returns today for follow-up visit.  Previously seen 10/07/2022 and Trokendi  dosage lowered to 100 mg nightly per patient request.  Did not recommend further dosage reduction due to increased risk of breakthrough seizure.  Medication not completely discontinued as this would prevent her from driving for 37-month duration.   Currently, reports overall doing well.  She admits to only taking 1 capsule of Trokendi  50mg  instead of 2 caps as she misunderstood dosage directions. She has been taking only 1 for over 6 months at least. She denies any recurrent seizure activity.  Migraines well-controlled, did have 3 in August but she attributes this to increased stress.  Resolved after using an ice pack and taking ibuprofen .       ROS:   14 system review of systems performed and negative with exception of those listed in HPI   Past Medical History:  Diagnosis Date   Allergic rhinitis, cause unspecified    Anal fissure    Arthritis    inflammatory and psoriatic--Dr. Dolphus   Asthma    related to allergies and colds   Chronic bronchitis    gets bronchitis yearly with colds   Chronic kidney disease kidney stones   Fracture    stress fracture right foot   GERD (gastroesophageal reflux disease)    Hernia (acquired) (recurrent)    R  lower abdomen; has seen [facility] (Dr. Fredrick and recurred   Hiatal hernia 03/2011   Impaired fasting glucose    Internal hemorrhoids 03/2011   Menstrual migraine    Dr, Jenel   Obesity    Reflux esophagitis 03/2011   Seizure disorder (HCC)    f/b Dr. Jenel   Seizures [facility]) last seisure june 2012   Sleep apnea    inconclusive test per pt (some central and obstructive component)   Sleep apnea 06/07/09   mild complex sleep apnea, worse in supine position (uses CPAP intermittently only)   Tinea cruris 7/09    Past Surgical History:  Procedure Laterality Date   BARIATRIC SURGERY  09/22/2017   CESAREAN SECTION     x2   CHOLECYSTECTOMY  2005   COLONOSCOPY  03/19/11   Dr. Obie; internal hemorrhoids   ESOPHAGOGASTRODUODENOSCOPY  03/19/11   hiatal hernia, esophagitis   KIDNEY STONE SURGERY  08/2010   retrieved with basket (at [facility])   repair of incisional hernia  2005, 2008   related to laparoscopic cholecystectomy    Family History  Problem Relation Age of Onset   Arthritis Mother        rheumatoid   Hyperlipidemia Mother    Hypertension Mother    Hyperthyroidism Mother        s/p thyroidectomy, now with hypothyroidism   Cancer Father 39       AML, lung cancer, kidney cancer (all  presented at same time)   Diabetes Father    Hypertension Father    Hyperlipidemia Father    Psoriasis Father    Anxiety disorder Father        OCD, nervous breakdown   Psoriasis Sister    Irritable bowel syndrome Sister    Arthritis Sister        psoriatic   Diabetes Sister    Stroke Maternal Aunt    Diabetes Paternal Uncle    Crohn's disease Paternal Uncle    Cancer Paternal Uncle        ? type   Colitis Paternal Uncle    Asthma Maternal Grandmother    Hyperlipidemia Maternal Grandmother    Heart disease Maternal Grandmother    Stroke Maternal Grandmother    Diabetes Maternal Grandfather    Heart disease Maternal Grandfather    Cancer Paternal Grandmother 49       female  (?ovarian)   Diabetes Paternal Grandmother    Hypertension Paternal Grandmother    Diabetes Paternal Grandfather    Heart disease Paternal Grandfather    Hypertension Paternal Grandfather    GER disease Daughter    Asthma Son    Stroke Cousin    Turner syndrome Niece    Esophageal cancer Neg Hx    Stomach cancer Neg Hx     Social history:  reports that she has never smoked. She has been exposed to tobacco smoke. She has never used smokeless tobacco. She reports that she does not currently use alcohol. She reports that she does not use drugs.    Allergies  Allergen Reactions   Cosentyx  [Secukinumab ] Anaphylaxis   Hydrocodone Itching and Palpitations   Vimpat  [Lacosamide ] Itching    Itching   Amoxicillin Hives   Codeine Itching and Other (See Comments)    Heart races.   Keppra [Levetiracetam]     Suicide ideation   Sulfa Antibiotics Nausea Only   Transderm-Scop [Scopolamine] Other (See Comments)    Stopped breathing.   Tape Rash    prefere paper tape    Medications:  Current Outpatient Medications on File Prior to Visit  Medication Sig Dispense Refill   acetaminophen (TYLENOL) 500 MG tablet as needed.      albuterol (PROVENTIL HFA;VENTOLIN HFA) 108 (90 BASE) MCG/ACT inhaler Inhale 2 puffs into the lungs as needed.     Cholecalciferol (VITAMIN D3) 1000 units CAPS Take by mouth.     clobetasol  cream (TEMOVATE ) 0.05 % Apply 1 Application topically 2 (two) times daily. 45 g 0   Cyanocobalamin  (VITAMIN B-12 PO) Take by mouth daily.     diclofenac  sodium (VOLTAREN ) 1 % GEL Apply 3 g to 3 large joints up to 3 times daily. 3 Tube 3   Emollient (DHEA EX) Apply topically.     fluconazole  (DIFLUCAN ) 150 MG tablet Take 1 tablet (150 mg) by mouth every 72 hours for recurrent yeast infection. (Patient taking differently: as needed. Take 1 tablet (150 mg) by mouth every 72 hours for recurrent yeast infection.) 5 tablet 0   folic acid  (FOLVITE ) 1 MG tablet TAKE 2 TABLETS DAILY 180 tablet  3   furosemide (LASIX) 20 MG tablet Take 20 mg by mouth daily as needed.     ibuprofen  (ADVIL ,MOTRIN ) 200 MG tablet Take 400 mg by mouth every 6 (six) hours as needed.     Meth-Inos-Chol-Aden-Lcarn-B12 (MIC COMBO IM) Inject into the muscle once a week. MIC SHOT     Multiple Minerals-Vitamins (CALCIUM-MAGNESIUM -ZINC -D3) TABS Take 2 tablets by  mouth daily.     NALTREXONE HCL PO Take 6 mg by mouth 2 (two) times daily.     OMEPRAZOLE  PO Take by mouth daily.     OTREXUP  20 MG/0.4ML SOAJ INJECT 20 MG UNDER THE SKIN ONCE A WEEK 1.6 mL 2   Prasterone, DHEA, (DHEA PO) Take by mouth daily.     TALTZ  80 MG/ML pen Maintenance Dose: Inject 80mg  (1 injection) under skin every 4 weeks. 3 mL 0   Topiramate  ER (TROKENDI  XR) 50 MG CP24 Take 2 capsules (100 mg total) by mouth at bedtime. 180 capsule 3   No current facility-administered medications on file prior to visit.        Objective: Today's Vitals   11/16/23 1000  BP: 139/86  Pulse: 91  Weight: 281 lb (127.5 kg)  Height: 5' 2 (1.575 m)   Body mass index is 51.4 kg/m.  General: well developed, well nourished, very pleasant middle-age Caucasian female, seated, in no evident distress  Neurologic Exam Mental Status: Awake and fully alert. Oriented to place and time. Recent and remote memory intact. Attention span, concentration and fund of knowledge appropriate. Mood and affect appropriate.  Cranial Nerves: Pupils equal, briskly reactive to light. Extraocular movements full without nystagmus. Visual fields full to confrontation. Hearing intact. Facial sensation intact. Face, tongue, palate moves normally and symmetrically.  Motor: Normal bulk and tone. Normal strength in all tested extremity muscles Sensory.: intact to touch , pinprick , position and vibratory sensation.  Coordination: Rapid alternating movements normal in all extremities. Finger-to-nose and heel-to-shin performed accurately bilaterally. Gait and Station: Arises from chair  without difficulty. Stance is wide based. Gait demonstrates normal stride length and balance without use of assistive device.  Reflexes: 1+ and symmetric. Toes downgoing.        Assessment/Plan:  1.  History of seizures, well controlled  2.  Migraine headache    Recommend continuation of Trokendi  50 mg nightly, previously advised 100mg  dosing but due to confusion of dosage, has only been taking 50mg  at least over the past 6 months. Will continue this dosage for now but low threshold to increase dosage. Did have 3 migraines in August which she attributes to increased stress, typically well controlled.  Advised to call with any recurrent seizure activity or worsening of migraine headaches.     Follow up in 1 year (in office per patient request) or call earlier if needed    CC:  Marinda Rocky CROME, DO   I personally spent a total of 25 minutes in the care of the patient today including preparing to see the patient, performing a medically appropriate exam/evaluation, counseling and educating, placing orders, and documenting clinical information in the EHR.   Harlene Bogaert, AGNP-BC  [facility] [address] [place], [place] [address]  Phone [phone] Fax [phone] Note: This document was prepared with digital dictation and possible smart phrase technology. Any transcriptional errors that result from this process are unintentional.

## 2023-11-16 ENCOUNTER — Ambulatory Visit: Admitting: Adult Health

## 2023-11-16 ENCOUNTER — Encounter: Payer: Self-pay | Admitting: Adult Health

## 2023-11-16 VITALS — BP 139/86 | HR 91 | Ht 62.0 in | Wt 281.0 lb

## 2023-11-16 DIAGNOSIS — G40209 Localization-related (focal) (partial) symptomatic epilepsy and epileptic syndromes with complex partial seizures, not intractable, without status epilepticus: Secondary | ICD-10-CM | POA: Diagnosis not present

## 2023-11-16 DIAGNOSIS — G43009 Migraine without aura, not intractable, without status migrainosus: Secondary | ICD-10-CM

## 2023-11-16 MED ORDER — TOPIRAMATE ER 50 MG PO CAP24: 1.0000 | ORAL_CAPSULE | Freq: Every day | ORAL | 3 refills | Status: AC

## 2023-11-16 NOTE — Patient Instructions (Addendum)
 Your Plan:  Continue Trokendi  50 mg nightly for both seizure and migraine prevention  Please call with any recurrent seizure activity or increase in migraine headaches     Follow-up in 1 year or call earlier if needed     Thank you for coming to see us  at [facility]. I hope we have been able to provide you high quality care today.  You may receive a patient satisfaction survey over the next few weeks. We would appreciate your feedback and comments so that we may continue to improve ourselves and the health of our patients.

## 2023-11-22 ENCOUNTER — Ambulatory Visit: Attending: Physician Assistant | Admitting: Physician Assistant

## 2023-11-22 ENCOUNTER — Encounter: Payer: Self-pay | Admitting: Physician Assistant

## 2023-11-22 VITALS — BP 138/86 | HR 71 | Resp 16 | Ht 62.0 in | Wt 283.0 lb

## 2023-11-22 DIAGNOSIS — Z79899 Other long term (current) drug therapy: Secondary | ICD-10-CM | POA: Diagnosis not present

## 2023-11-22 DIAGNOSIS — M79641 Pain in right hand: Secondary | ICD-10-CM

## 2023-11-22 DIAGNOSIS — B372 Candidiasis of skin and nail: Secondary | ICD-10-CM

## 2023-11-22 DIAGNOSIS — Z8709 Personal history of other diseases of the respiratory system: Secondary | ICD-10-CM

## 2023-11-22 DIAGNOSIS — Z8669 Personal history of other diseases of the nervous system and sense organs: Secondary | ICD-10-CM

## 2023-11-22 DIAGNOSIS — Z9884 Bariatric surgery status: Secondary | ICD-10-CM

## 2023-11-22 DIAGNOSIS — L4050 Arthropathic psoriasis, unspecified: Secondary | ICD-10-CM | POA: Diagnosis not present

## 2023-11-22 DIAGNOSIS — G8929 Other chronic pain: Secondary | ICD-10-CM

## 2023-11-22 DIAGNOSIS — Z9889 Other specified postprocedural states: Secondary | ICD-10-CM

## 2023-11-22 DIAGNOSIS — Z1589 Genetic susceptibility to other disease: Secondary | ICD-10-CM | POA: Diagnosis not present

## 2023-11-22 DIAGNOSIS — G5702 Lesion of sciatic nerve, left lower limb: Secondary | ICD-10-CM

## 2023-11-22 DIAGNOSIS — M19072 Primary osteoarthritis, left ankle and foot: Secondary | ICD-10-CM

## 2023-11-22 DIAGNOSIS — Z8719 Personal history of other diseases of the digestive system: Secondary | ICD-10-CM

## 2023-11-22 DIAGNOSIS — Z8261 Family history of arthritis: Secondary | ICD-10-CM

## 2023-11-22 DIAGNOSIS — M25561 Pain in right knee: Secondary | ICD-10-CM

## 2023-11-22 DIAGNOSIS — L409 Psoriasis, unspecified: Secondary | ICD-10-CM

## 2023-11-22 DIAGNOSIS — Z9049 Acquired absence of other specified parts of digestive tract: Secondary | ICD-10-CM

## 2023-11-22 DIAGNOSIS — M79642 Pain in left hand: Secondary | ICD-10-CM

## 2023-11-22 DIAGNOSIS — M19071 Primary osteoarthritis, right ankle and foot: Secondary | ICD-10-CM

## 2023-11-22 LAB — CBC WITH DIFFERENTIAL/PLATELET
Absolute Lymphocytes: 2539 {cells}/uL (ref 850–3900)
Absolute Monocytes: 624 {cells}/uL (ref 200–950)
Basophils Absolute: 50 {cells}/uL (ref 0–200)
Basophils Relative: 0.8 %
Eosinophils Absolute: 50 {cells}/uL (ref 15–500)
Eosinophils Relative: 0.8 %
HCT: 40.9 % (ref 35.0–45.0)
Hemoglobin: 13.1 g/dL (ref 11.7–15.5)
MCH: 27.7 pg (ref 27.0–33.0)
MCHC: 32 g/dL (ref 32.0–36.0)
MCV: 86.5 fL (ref 80.0–100.0)
MPV: 9.2 fL (ref 7.5–12.5)
Monocytes Relative: 9.9 %
Neutro Abs: 3037 {cells}/uL (ref 1500–7800)
Neutrophils Relative %: 48.2 %
Platelets: 377 Thousand/uL (ref 140–400)
RBC: 4.73 Million/uL (ref 3.80–5.10)
RDW: 14.8 % (ref 11.0–15.0)
Total Lymphocyte: 40.3 %
WBC: 6.3 Thousand/uL (ref 3.8–10.8)

## 2023-11-22 LAB — COMPREHENSIVE METABOLIC PANEL WITH GFR
AG Ratio: 1.3 (calc) (ref 1.0–2.5)
ALT: 14 U/L (ref 6–29)
AST: 16 U/L (ref 10–35)
Albumin: 4.1 g/dL (ref 3.6–5.1)
Alkaline phosphatase (APISO): 82 U/L (ref 37–153)
BUN: 16 mg/dL (ref 7–25)
CO2: 29 mmol/L (ref 20–32)
Calcium: 9.2 mg/dL (ref 8.6–10.4)
Chloride: 102 mmol/L (ref 98–110)
Creat: 0.63 mg/dL (ref 0.50–1.03)
Globulin: 3.1 g/dL (ref 1.9–3.7)
Glucose, Bld: 89 mg/dL (ref 65–99)
Potassium: 4.4 mmol/L (ref 3.5–5.3)
Sodium: 140 mmol/L (ref 135–146)
Total Bilirubin: 0.3 mg/dL (ref 0.2–1.2)
Total Protein: 7.2 g/dL (ref 6.1–8.1)
eGFR: 103 mL/min/1.73m2 (ref >=60)

## 2023-11-22 MED ORDER — CLOBETASOL PROPIONATE 0.05 % EX CREA: 1.0000 | TOPICAL_CREAM | Freq: Two times a day (BID) | CUTANEOUS | 0 refills | Status: DC

## 2023-11-23 ENCOUNTER — Ambulatory Visit: Payer: Self-pay | Admitting: Physician Assistant

## 2023-11-23 NOTE — Progress Notes (Signed)
 CBC and CMP WNL

## 2023-12-22 NOTE — Telephone Encounter (Signed)
 Thank you :)

## 2024-01-10 DIAGNOSIS — L4050 Arthropathic psoriasis, unspecified: Secondary | ICD-10-CM | POA: Diagnosis not present

## 2024-01-10 DIAGNOSIS — Z0000 Encounter for general adult medical examination without abnormal findings: Secondary | ICD-10-CM | POA: Diagnosis not present

## 2024-01-10 DIAGNOSIS — G40209 Localization-related (focal) (partial) symptomatic epilepsy and epileptic syndromes with complex partial seizures, not intractable, without status epilepticus: Secondary | ICD-10-CM | POA: Diagnosis not present

## 2024-01-14 ENCOUNTER — Other Ambulatory Visit: Payer: Self-pay | Admitting: Physician Assistant

## 2024-01-14 DIAGNOSIS — L409 Psoriasis, unspecified: Secondary | ICD-10-CM

## 2024-01-14 DIAGNOSIS — L4050 Arthropathic psoriasis, unspecified: Secondary | ICD-10-CM

## 2024-01-14 DIAGNOSIS — Z79899 Other long term (current) drug therapy: Secondary | ICD-10-CM

## 2024-01-14 NOTE — Telephone Encounter (Signed)
 Last Fill: 09/09/2023  Labs: 10/27/02025 MCH 26.8, MCHC 32.3  TB Gold: 08/16/2023 Neg   Next Visit: 02/23/2024  Last Visit: 11/22/2023  IK:Ednmpjupr arthritis   Current Dose per office note 11/22/2023: Taltz  80 mg sq injections once every 28 days   Okay to refill Taltz ?

## 2024-01-28 ENCOUNTER — Other Ambulatory Visit: Payer: Self-pay

## 2024-01-28 NOTE — Telephone Encounter (Signed)
 Patient contacted the office and requests a refill of Otrexup  be sent to [facility].  Last Fill: 02/01/2023  Labs: 01/10/2024 MCH 26.8 MCHC 32.3  Next Visit: 02/23/2024  Last Visit: 11/22/2023  DX: Psoriatic arthritis (HCC)   Current Dose per office note 11/22/2023: Otrexup  20 mg sq weekly   Okay to refill Otrexup ?

## 2024-01-30 MED ORDER — OTREXUP 20 MG/0.4ML ~~LOC~~ SOAJ: SUBCUTANEOUS | 2 refills | Status: DC

## 2024-01-31 ENCOUNTER — Other Ambulatory Visit: Payer: Self-pay

## 2024-01-31 DIAGNOSIS — N644 Mastodynia: Secondary | ICD-10-CM | POA: Diagnosis not present

## 2024-01-31 DIAGNOSIS — L988 Other specified disorders of the skin and subcutaneous tissue: Secondary | ICD-10-CM | POA: Diagnosis not present

## 2024-01-31 MED ORDER — RASUVO 20 MG/0.4ML ~~LOC~~ SOAJ: 20.0000 mg | SUBCUTANEOUS | 2 refills | Status: DC

## 2024-01-31 NOTE — Progress Notes (Signed)
 Sent new rx

## 2024-01-31 NOTE — Progress Notes (Unsigned)
 Received a fax from [facility] regarding the patient's Otrexup  20 mg. Fax states Otrexup  20 mg is no longer available and the only available Otrexup  strengths are 10 mg, 12.5 mg, and 17.5 mg. Fax states the prescription can also be switched to Rasuvo  20 mg. Will work up a prescription for Rasuvo  20 mg, per our Pharmacist. Please review and change anything as needed.

## 2024-01-31 NOTE — Progress Notes (Unsigned)
Attempted to contact the patient and could not leave a message because the mail box was full.  

## 2024-02-01 ENCOUNTER — Telehealth: Payer: Self-pay

## 2024-02-01 NOTE — Telephone Encounter (Addendum)
 Submitted a Prior Authorization request to [facility] for RASUVO  via CoverMyMeds. Will update once we receive a response.  Key: [id number]  Per automated response: Prior Authorization Not Required  Sherry Pennant, PharmD, MPH, BCPS, CPP Clinical Pharmacist [facility])    ----- Message from Sherry GORMAN Pennant sent at 01/31/2024  2:58 PM EST ----- Patient on Otrexup . Needs to switch to Rasuvo .  Please run urgent PA for Rasuvo  20mg  subcut weekly  (PA may not be required? Does not look like we ever submitted one for Otrexup )

## 2024-02-02 ENCOUNTER — Other Ambulatory Visit: Payer: Self-pay

## 2024-02-02 MED ORDER — PREDNISONE 5 MG PO TABS: ORAL_TABLET | ORAL | 0 refills | Status: AC

## 2024-02-02 NOTE — Telephone Encounter (Signed)
 Patient is currently waiting on her Rasuvo  prescription after it was changed from Otrexup  to Rasuvo . Patient states she has not had medication for about a couple weeksand is starting to feel the affects. Patient is inquiring if she can get a prednisone  taper sent in to help her. Pended prednisone  taper. If taper should be changed, please advise.

## 2024-02-02 NOTE — Telephone Encounter (Signed)
 Patient contacted the office to inquire about her Rasuvo . Advised the patient there is no prior authorization required and the pharmacist is sending her the co-pay card information to give to the pharmacy so she can get her medication. Patient verbalized understanding.

## 2024-02-03 NOTE — Progress Notes (Unsigned)
 Advised patient of medication change, and advised her of Rasuvo  authorization information per telephone note on 02/01/2024.

## 2024-02-09 NOTE — Progress Notes (Unsigned)
 Office Visit Note  Patient: Tiffany Wilkins             Date of Birth: [birth year]           MRN: [id number]             PCP: Marinda Rocky CROME, DO (Inactive) Referring: Marinda Rocky CROME, DO Visit Date: 02/23/2024 Occupation: Data Unavailable  Subjective:    History of Present Illness: MAKAYLIN CARLO is a 58 y.o. female with history of psoriatic arthritis.  Patient remains on  Taltz  80 mg sq inj q28 days-initiated on 02/10/2022, rasuvo  20 mg sq weekly, and folic acid  2 mg daily.    CBC and CMP updated on 01/10/24. Her next lab work will be due in January and every 3 months.   TB gold negative on 08/16/23.   Discussed the importance of holding taltz  and rasuvo  if she develops signs or symptoms of an infection and to resume once the infection has completely cleared.   Activities of Daily Living:  Patient reports morning stiffness for *** {minute/hour:19697}.   Patient {ACTIONS;DENIES/REPORTS:21021675::Denies} nocturnal pain.  Difficulty dressing/grooming: {ACTIONS;DENIES/REPORTS:21021675::Denies} Difficulty climbing stairs: {ACTIONS;DENIES/REPORTS:21021675::Denies} Difficulty getting out of chair: {ACTIONS;DENIES/REPORTS:21021675::Denies} Difficulty using hands for taps, buttons, cutlery, and/or writing: {ACTIONS;DENIES/REPORTS:21021675::Denies}  No Rheumatology ROS completed.   PMFS History:  Patient Active Problem List   Diagnosis Date Noted   Primary osteoarthritis of both knees 12/11/2016   High risk medication use 06/30/2016   Right hand pain 06/30/2016   Right hip pain 06/30/2016   Hepatic steatosis 06/27/2014   BMI 50.0-59.9, adult (HCC) 06/27/2014   Fibroid, uterine 06/27/2014   Abdominal pain, chronic, right lower quadrant 06/27/2014   Obesity hypoventilation syndrome (HCC) 12/08/2013   Nocturnal seizures (HCC) 12/08/2013   OSA (obstructive sleep apnea) 12/08/2013   Noncompliance with CPAP treatment 12/08/2013   Psoriasis 11/29/2013   Seizure disorder, complex  partial (HCC) 06/01/2012   Migraine 06/01/2012   OSA on CPAP 01/04/2012   Hiatal hernia 03/26/2011   Internal hemorrhoid 03/24/2011   Reflux esophagitis 03/24/2011   Psoriatic arthritis (HCC) 01/22/2011   Hernia of abdominal wall 01/22/2011    Past Medical History:  Diagnosis Date   Allergic rhinitis, cause unspecified    Anal fissure    Arthritis    inflammatory and psoriatic--Dr. Dolphus   Asthma    related to allergies and colds   Chronic bronchitis    gets bronchitis yearly with colds   Chronic kidney disease kidney stones   Fracture    stress fracture right foot   GERD (gastroesophageal reflux disease)    Hernia (acquired) (recurrent)    R lower abdomen; has seen [facility] (Dr. Fredrick and recurred   Hiatal hernia 03/2011   Impaired fasting glucose    Internal hemorrhoids 03/2011   Menstrual migraine    Dr, Jenel   Obesity    Reflux esophagitis 03/2011   Seizure disorder (HCC)    f/b Dr. Jenel   Seizures [facility]) last seisure june 2012   Sleep apnea    inconclusive test per pt (some central and obstructive component)   Sleep apnea 06/07/09   mild complex sleep apnea, worse in supine position (uses CPAP intermittently only)   Tinea cruris 7/09    Family History  Problem Relation Age of Onset   Arthritis Mother        rheumatoid   Hyperlipidemia Mother    Hypertension Mother    Hyperthyroidism Mother        s/p thyroidectomy, now  with hypothyroidism   Cancer Father 54       AML, lung cancer, kidney cancer (all presented at same time)   Diabetes Father    Hypertension Father    Hyperlipidemia Father    Psoriasis Father    Anxiety disorder Father        OCD, nervous breakdown   Psoriasis Sister    Irritable bowel syndrome Sister    Arthritis Sister        psoriatic   Diabetes Sister    Stroke Maternal Aunt    Diabetes Paternal Uncle    Crohn's disease Paternal Uncle    Cancer Paternal Uncle        ? type   Colitis Paternal Uncle    Asthma Maternal  Grandmother    Hyperlipidemia Maternal Grandmother    Heart disease Maternal Grandmother    Stroke Maternal Grandmother    Diabetes Maternal Grandfather    Heart disease Maternal Grandfather    Cancer Paternal Grandmother 49       female (?ovarian)   Diabetes Paternal Grandmother    Hypertension Paternal Grandmother    Diabetes Paternal Grandfather    Heart disease Paternal Grandfather    Hypertension Paternal Grandfather    GER disease Daughter    Asthma Son    Stroke Cousin    Turner syndrome Niece    Esophageal cancer Neg Hx    Stomach cancer Neg Hx    Past Surgical History:  Procedure Laterality Date   BARIATRIC SURGERY  09/22/2017   CESAREAN SECTION     x2   CHOLECYSTECTOMY  2005   COLONOSCOPY  03/19/11   Dr. Obie; internal hemorrhoids   ESOPHAGOGASTRODUODENOSCOPY  03/19/11   hiatal hernia, esophagitis   KIDNEY STONE SURGERY  08/2010   retrieved with basket (at [facility])   repair of incisional hernia  2005, 2008   related to laparoscopic cholecystectomy   Social History   Tobacco Use   Smoking status: Never    Passive exposure: Past   Smokeless tobacco: Never  Vaping Use   Vaping status: Never Used  Substance Use Topics   Alcohol use: Not Currently    Alcohol/week: 0.0 standard drinks of alcohol   Drug use: No   Social History   Social History Narrative   Patient Lives with husband ETTER Rear)  and 2 children (son and daughter, 16, 33)   Patient is right handed.   Patient drinks 2 cups daily- coffee/soda              Immunization History  Administered Date(s) Administered   Influenza Split 12/29/2010   Influenza,inj,Quad PF,6+ Mos 11/29/2013   Tdap 11/29/2013     Objective: Vital Signs: LMP 06/12/2014    Physical Exam Vitals and nursing note reviewed.  Constitutional:      Appearance: She is well-developed.  HENT:     Head: Normocephalic and atraumatic.  Eyes:     Conjunctiva/sclera: Conjunctivae normal.  Cardiovascular:     Rate and  Rhythm: Normal rate and regular rhythm.     Heart sounds: Normal heart sounds.  Pulmonary:     Effort: Pulmonary effort is normal.     Breath sounds: Normal breath sounds.  Abdominal:     General: Bowel sounds are normal.     Palpations: Abdomen is soft.  Musculoskeletal:     Cervical back: Normal range of motion.  Lymphadenopathy:     Cervical: No cervical adenopathy.  Skin:    General: Skin is warm  and dry.     Capillary Refill: Capillary refill takes less than 2 seconds.  Neurological:     Mental Status: She is alert and oriented to person, place, and time.  Psychiatric:        Behavior: Behavior normal.      Musculoskeletal Exam: ***  CDAI Exam: CDAI Score: -- Patient Global: --; Provider Global: -- Swollen: --; Tender: -- Joint Exam 02/23/2024   No joint exam has been documented for this visit   There is currently no information documented on the homunculus. Go to the Rheumatology activity and complete the homunculus joint exam.  Investigation: No additional findings.  Imaging: No results found.  Recent Labs: Lab Results  Component Value Date   WBC 6.3 11/22/2023   HGB 13.1 11/22/2023   PLT 377 11/22/2023   NA 140 11/22/2023   K 4.4 11/22/2023   CL 102 11/22/2023   CO2 29 11/22/2023   GLUCOSE 89 11/22/2023   BUN 16 11/22/2023   CREATININE 0.63 11/22/2023   BILITOT 0.3 11/22/2023   ALKPHOS 75 10/16/2016   AST 16 11/22/2023   ALT 14 11/22/2023   PROT 7.2 11/22/2023   ALBUMIN 4.2 10/16/2016   CALCIUM 9.2 11/22/2023   GFRAA 114 09/18/2020   QFTBGOLDPLUS NEGATIVE 08/16/2023    Speciality Comments: TB Gold: 10/16/16 Neg Failed Cosentyx -developed rash that sent her to the ER Does not want to try TNF-inhibitor due to neurologic risk since she has epilepsy  Procedures:  No procedures performed Allergies: Cosentyx  [secukinumab ], Hydrocodone, Vimpat  [lacosamide ], Amoxicillin, Codeine, Keppra [levetiracetam], Sulfa antibiotics, Transderm-scop  [scopolamine], and Tape   Assessment / Plan:     Visit Diagnoses: Psoriatic arthritis (HCC)  Psoriasis  High risk medication use  HLA B27 positive  Piriformis syndrome, left  Pain in both hands  Chronic pain of right knee  Primary osteoarthritis of both feet  History of sleep apnea  Skin yeast infection  History of cholecystectomy  History of asthma  History of epilepsy  History of gastric bypass  History of hernia repair  Family history of rheumatoid arthritis  Orders: No orders of the defined types were placed in this encounter.  No orders of the defined types were placed in this encounter.   Face-to-face time spent with patient was *** minutes. Greater than 50% of time was spent in counseling and coordination of care.  Follow-Up Instructions: No follow-ups on file.   Waddell CHRISTELLA Craze, PA-C  Note - This record has been created using Dragon software.  Chart creation errors have been sought, but may not always  have been located. Such creation errors do not reflect on  the standard of medical care.

## 2024-02-15 ENCOUNTER — Other Ambulatory Visit: Payer: Self-pay | Admitting: Physician Assistant

## 2024-02-15 NOTE — Telephone Encounter (Signed)
 Last Fill: 02/15/2023  Next Visit: 02/23/2024  Last Visit: 11/22/2023  DX: Psoriatic arthritis   Current Dose per office note on 11/22/2023: folic acid  2 mg daily   Okay to refill folic acid ?

## 2024-02-23 ENCOUNTER — Encounter: Payer: Self-pay | Admitting: Physician Assistant

## 2024-02-23 ENCOUNTER — Ambulatory Visit: Attending: Physician Assistant | Admitting: Physician Assistant

## 2024-02-23 VITALS — BP 165/78 | HR 69 | Temp 98.3°F | Resp 16 | Ht 62.0 in | Wt 292.2 lb

## 2024-02-23 DIAGNOSIS — M19071 Primary osteoarthritis, right ankle and foot: Secondary | ICD-10-CM | POA: Diagnosis not present

## 2024-02-23 DIAGNOSIS — M19072 Primary osteoarthritis, left ankle and foot: Secondary | ICD-10-CM

## 2024-02-23 DIAGNOSIS — M79642 Pain in left hand: Secondary | ICD-10-CM

## 2024-02-23 DIAGNOSIS — Z8669 Personal history of other diseases of the nervous system and sense organs: Secondary | ICD-10-CM

## 2024-02-23 DIAGNOSIS — M79641 Pain in right hand: Secondary | ICD-10-CM

## 2024-02-23 DIAGNOSIS — G5702 Lesion of sciatic nerve, left lower limb: Secondary | ICD-10-CM

## 2024-02-23 DIAGNOSIS — B372 Candidiasis of skin and nail: Secondary | ICD-10-CM | POA: Diagnosis not present

## 2024-02-23 DIAGNOSIS — L4050 Arthropathic psoriasis, unspecified: Secondary | ICD-10-CM | POA: Diagnosis not present

## 2024-02-23 DIAGNOSIS — M25561 Pain in right knee: Secondary | ICD-10-CM | POA: Diagnosis not present

## 2024-02-23 DIAGNOSIS — L409 Psoriasis, unspecified: Secondary | ICD-10-CM

## 2024-02-23 DIAGNOSIS — Z8709 Personal history of other diseases of the respiratory system: Secondary | ICD-10-CM | POA: Diagnosis not present

## 2024-02-23 DIAGNOSIS — Z79899 Other long term (current) drug therapy: Secondary | ICD-10-CM | POA: Diagnosis not present

## 2024-02-23 DIAGNOSIS — G8929 Other chronic pain: Secondary | ICD-10-CM

## 2024-02-23 DIAGNOSIS — Z9049 Acquired absence of other specified parts of digestive tract: Secondary | ICD-10-CM

## 2024-02-23 DIAGNOSIS — Z9884 Bariatric surgery status: Secondary | ICD-10-CM

## 2024-02-23 DIAGNOSIS — Z1589 Genetic susceptibility to other disease: Secondary | ICD-10-CM

## 2024-02-23 DIAGNOSIS — Z8719 Personal history of other diseases of the digestive system: Secondary | ICD-10-CM

## 2024-02-23 DIAGNOSIS — Z8261 Family history of arthritis: Secondary | ICD-10-CM

## 2024-02-23 DIAGNOSIS — Z9889 Other specified postprocedural states: Secondary | ICD-10-CM

## 2024-02-23 MED ORDER — PREDNISONE 5 MG PO TABS: ORAL_TABLET | ORAL | 0 refills | Status: AC

## 2024-02-23 MED ORDER — CLOBETASOL PROPIONATE 0.05 % EX CREA: 1.0000 | TOPICAL_CREAM | Freq: Two times a day (BID) | CUTANEOUS | 0 refills | Status: AC

## 2024-03-24 ENCOUNTER — Other Ambulatory Visit: Payer: Self-pay | Admitting: Rheumatology

## 2024-03-24 DIAGNOSIS — Z79899 Other long term (current) drug therapy: Secondary | ICD-10-CM

## 2024-03-24 DIAGNOSIS — L4050 Arthropathic psoriasis, unspecified: Secondary | ICD-10-CM

## 2024-03-24 DIAGNOSIS — L409 Psoriasis, unspecified: Secondary | ICD-10-CM

## 2024-03-24 NOTE — Telephone Encounter (Signed)
 Last Fill: 01/14/2024  Labs: 01/10/2024 MCH 26.8, MCHC 32.3   TB Gold: 08/16/2023 Neg    Next Visit: 07/25/2024  Last Visit: 02/23/2024  DX: Psoriatic arthritis   Current Dose per office note 02/23/2024: Taltz  80 mg sq inj q28 days   Okay to refill Taltz ?

## 2024-04-10 ENCOUNTER — Other Ambulatory Visit: Payer: Self-pay | Admitting: Physician Assistant

## 2024-04-10 DIAGNOSIS — Z79899 Other long term (current) drug therapy: Secondary | ICD-10-CM

## 2024-04-11 NOTE — Telephone Encounter (Signed)
 Last Fill: 01/31/2024  Labs: 01/10/2024  MCH 26.8 MCHC 32.3  Next Visit: 07/25/2024  Last Visit: 02/23/2024  DX: Psoriatic arthritis (HCC)   Current Dose per office note 02/23/2024: Rasuvo  20 mg sq weekly   Contacted the patient and advised she is due to update labs. Patient states she will be in [facility] tomorrow and will plan to stop by the office then to get her lab work done. Patient advised of lab hours. Will pend future CBC and CMP.   Okay to refill Rasuvo ?

## 2024-04-12 ENCOUNTER — Other Ambulatory Visit: Payer: Self-pay | Admitting: *Deleted

## 2024-04-12 ENCOUNTER — Telehealth: Payer: Self-pay | Admitting: Adult Health

## 2024-04-12 ENCOUNTER — Telehealth: Payer: Self-pay | Admitting: Pharmacy Technician

## 2024-04-12 ENCOUNTER — Other Ambulatory Visit (HOSPITAL_COMMUNITY): Payer: Self-pay

## 2024-04-12 DIAGNOSIS — Z79899 Other long term (current) drug therapy: Secondary | ICD-10-CM

## 2024-04-12 NOTE — Telephone Encounter (Signed)
 PA has been submitted, and telephone encounter has been created. Please see telephone encounter dated 1.28.26.

## 2024-04-12 NOTE — Telephone Encounter (Signed)
 Pharmacy Patient Advocate Encounter   Received notification from Pt Calls Messages that prior authorization for TOPIRAMATE  ER 50MG  is required/requested.   Insurance verification completed.   The patient is insured through [facility].   Per test claim: PA required; PA submitted to above mentioned insurance via Latent Key/confirmation #/[facility] Status is pending

## 2024-04-12 NOTE — Telephone Encounter (Signed)
 Pt called stating that her insurance sent her a letter stating that they will no longer cover her Topiramate  ER (TROKENDI  XR) 50 MG CP24 unless she has a value PA. Please advise.

## 2024-04-14 ENCOUNTER — Ambulatory Visit: Payer: Self-pay | Admitting: Physician Assistant

## 2024-04-14 LAB — CBC WITH DIFFERENTIAL/PLATELET
Absolute Lymphocytes: 2843 {cells}/uL (ref 850–3900)
Absolute Monocytes: 720 {cells}/uL (ref 200–950)
Basophils Absolute: 68 {cells}/uL (ref 0–200)
Basophils Relative: 0.9 %
Eosinophils Absolute: 83 {cells}/uL (ref 15–500)
Eosinophils Relative: 1.1 %
HCT: 40.3 % (ref 35.9–46.0)
Hemoglobin: 12.6 g/dL (ref 11.7–15.5)
MCH: 26.3 pg — ABNORMAL LOW (ref 27.0–33.0)
MCHC: 31.3 g/dL — ABNORMAL LOW (ref 31.6–35.4)
MCV: 84.1 fL (ref 81.4–101.7)
MPV: 9.1 fL (ref 7.5–12.5)
Monocytes Relative: 9.6 %
Neutro Abs: 3788 {cells}/uL (ref 1500–7800)
Neutrophils Relative %: 50.5 %
Platelets: 376 10*3/uL (ref 140–400)
RBC: 4.79 Million/uL (ref 3.80–5.10)
RDW: 17 % — ABNORMAL HIGH (ref 11.0–15.0)
Total Lymphocyte: 37.9 %
WBC: 7.5 10*3/uL (ref 3.8–10.8)

## 2024-04-14 LAB — COMPREHENSIVE METABOLIC PANEL WITH GFR
AG Ratio: 1.5 (calc) (ref 1.0–2.5)
ALT: 17 U/L (ref 6–29)
AST: 17 U/L (ref 10–35)
Albumin: 4.1 g/dL (ref 3.6–5.1)
Alkaline phosphatase (APISO): 73 U/L (ref 37–153)
BUN: 11 mg/dL (ref 7–25)
CO2: 31 mmol/L (ref 20–32)
Calcium: 9.2 mg/dL (ref 8.6–10.4)
Chloride: 100 mmol/L (ref 98–110)
Creat: 0.64 mg/dL (ref 0.50–1.03)
Globulin: 2.8 g/dL (ref 1.9–3.7)
Glucose, Bld: 95 mg/dL (ref 65–99)
Potassium: 4.7 mmol/L (ref 3.5–5.3)
Sodium: 138 mmol/L (ref 135–146)
Total Bilirubin: 0.3 mg/dL (ref 0.2–1.2)
Total Protein: 6.9 g/dL (ref 6.1–8.1)
eGFR: 102 mL/min/{1.73_m2}

## 2024-04-14 NOTE — Progress Notes (Signed)
 CMP WNL CBC stable.  MCH and MCHC are low-may be related to iron deficiency.  Patient was previously receiving infusions--please clarify if she has restarted?

## 2024-04-17 ENCOUNTER — Other Ambulatory Visit (HOSPITAL_COMMUNITY): Payer: Self-pay

## 2024-04-17 NOTE — Telephone Encounter (Signed)
 Pharmacy Patient Advocate Encounter  Received notification from [facility] that Prior Authorization for TOPIRAMATE  ER 50MG  has been APPROVED from 1.28.26 to 1.26.27. Ran test claim, Copay is $15. This test claim was processed through [facility]- copay amounts may vary at other pharmacies due to pharmacy/plan contracts, or as the patient moves through the different stages of their insurance plan.   PA #/Case ID/Reference #: [id number]

## 2024-07-25 ENCOUNTER — Ambulatory Visit: Admitting: Physician Assistant

## 2024-11-27 ENCOUNTER — Ambulatory Visit: Admitting: Adult Health
